# Patient Record
Sex: Male | Born: 1955 | Race: Black or African American | Hispanic: No | Marital: Married | Smoking: Former smoker
Health system: Southern US, Community
[De-identification: ages and names within clinical notes are randomized; demographics above are authoritative.]

## PROBLEM LIST (undated history)

## (undated) DIAGNOSIS — I10 Essential (primary) hypertension: Secondary | ICD-10-CM

## (undated) DIAGNOSIS — E785 Hyperlipidemia, unspecified: Secondary | ICD-10-CM

## (undated) DIAGNOSIS — G5600 Carpal tunnel syndrome, unspecified upper limb: Secondary | ICD-10-CM

## (undated) DIAGNOSIS — H9190 Unspecified hearing loss, unspecified ear: Secondary | ICD-10-CM

## (undated) DIAGNOSIS — E119 Type 2 diabetes mellitus without complications: Secondary | ICD-10-CM

## (undated) DIAGNOSIS — F17200 Nicotine dependence, unspecified, uncomplicated: Secondary | ICD-10-CM

## (undated) DIAGNOSIS — I739 Peripheral vascular disease, unspecified: Secondary | ICD-10-CM

## (undated) HISTORY — DX: Peripheral vascular disease, unspecified: I73.9

## (undated) HISTORY — DX: Type 2 diabetes mellitus without complications: E11.9

## (undated) HISTORY — DX: Hyperlipidemia, unspecified: E78.5

## (undated) HISTORY — DX: Nicotine dependence, unspecified, uncomplicated: F17.200

## (undated) HISTORY — PX: CARPAL TUNNEL RELEASE: SHX101

## (undated) HISTORY — PX: BACK SURGERY: SHX140

---

## 2006-10-03 ENCOUNTER — Ambulatory Visit: Payer: Self-pay | Admitting: Internal Medicine

## 2006-10-03 ENCOUNTER — Observation Stay (HOSPITAL_COMMUNITY): Admission: EM | Admit: 2006-10-03 | Discharge: 2006-10-04 | Payer: Self-pay | Admitting: Emergency Medicine

## 2006-10-04 ENCOUNTER — Encounter: Payer: Self-pay | Admitting: Internal Medicine

## 2006-10-08 ENCOUNTER — Ambulatory Visit: Payer: Self-pay

## 2006-10-20 ENCOUNTER — Ambulatory Visit: Payer: Self-pay | Admitting: Cardiology

## 2010-05-04 HISTORY — PX: COLONOSCOPY: SHX174

## 2010-07-04 ENCOUNTER — Ambulatory Visit: Payer: Self-pay | Admitting: Family Medicine

## 2010-07-04 ENCOUNTER — Ambulatory Visit (INDEPENDENT_AMBULATORY_CARE_PROVIDER_SITE_OTHER): Payer: 59 | Admitting: Family Medicine

## 2010-07-04 DIAGNOSIS — Z0000 Encounter for general adult medical examination without abnormal findings: Secondary | ICD-10-CM

## 2010-07-04 DIAGNOSIS — F17200 Nicotine dependence, unspecified, uncomplicated: Secondary | ICD-10-CM

## 2010-07-04 DIAGNOSIS — E785 Hyperlipidemia, unspecified: Secondary | ICD-10-CM

## 2010-07-04 DIAGNOSIS — F528 Other sexual dysfunction not due to a substance or known physiological condition: Secondary | ICD-10-CM

## 2010-07-08 LAB — HM COLONOSCOPY: HM Colonoscopy: NORMAL

## 2010-07-23 ENCOUNTER — Encounter: Payer: Self-pay | Admitting: Family Medicine

## 2010-09-09 ENCOUNTER — Ambulatory Visit (INDEPENDENT_AMBULATORY_CARE_PROVIDER_SITE_OTHER): Payer: 59 | Admitting: Family Medicine

## 2010-09-09 ENCOUNTER — Encounter: Payer: Self-pay | Admitting: Family Medicine

## 2010-09-09 VITALS — BP 118/76 | HR 80 | Wt 200.0 lb

## 2010-09-09 DIAGNOSIS — F17200 Nicotine dependence, unspecified, uncomplicated: Secondary | ICD-10-CM

## 2010-09-09 DIAGNOSIS — E785 Hyperlipidemia, unspecified: Secondary | ICD-10-CM

## 2010-09-09 NOTE — Progress Notes (Signed)
  Subjective:    Patient ID: Oscar Myers, male    DOB: 1955-08-20, 55 y.o.   MRN: [id number]  HPI he is here for a recheck appointment. He is having no difficulty with his Crestor. He did quit smoking but started again after he became stressed. He does have another quit date set for tomorrow.    Review of Systems     Objective:   Physical Exam alert and in no distress otherwise not examined        Assessment & Plan:  Dyslipidemia. Cigarette abuse. I again discussed smoking cessation with him in regard to abbot versus addiction. We also discussed stress and stress management. Call me if he has further difficulties with this.

## 2010-09-10 ENCOUNTER — Encounter: Payer: Self-pay | Admitting: Family Medicine

## 2010-09-10 LAB — LIPID PANEL
Cholesterol: 177 mg/dL (ref 0–200)
HDL: 31 mg/dL — ABNORMAL LOW (ref >39)
LDL Cholesterol: 116 mg/dL — ABNORMAL HIGH (ref 0–99)
Total CHOL/HDL Ratio: 5.7 Ratio
Triglycerides: 148 mg/dL (ref <150)
VLDL: 30 mg/dL (ref 0–40)

## 2010-09-16 NOTE — Consult Note (Signed)
NAME:  Oscar Myers, Oscar Myers NO.:  [id number]   MEDICAL RECORD NO.:  [id number]          PATIENT TYPE:  OBV   LOCATION:  2018                         FACILITY:  [facility]   PHYSICIAN:  Pricilla Riffle, MD, FACCDATE OF BIRTH:  July 19, 1955   DATE OF CONSULTATION:  10/04/2006  DATE OF DISCHARGE:                                 CONSULTATION   IDENTIFICATION:  Oscar Myers is a 55 year old gentleman who we are asked  to see regarding chest pain.   HISTORY OF PRESENT ILLNESS:  The patient has no known history of  coronary artery disease.  He was admitted on June 1 with a 3-day history  of constant chest pain left parasternal, waxes and wanes, but did not go  away.  No exacerbating or relieving factors until he came to the  hospital.  He received nitroglycerin with some easing though still  present.   The patient had been under increased stress.  No shortness of breath,  active.   ALLERGIES:  VYTORIN LEADING TO ACHES.  LIPITOR TAKEN OFF, QUESTION WHY.   MEDICATIONS HERE:  1. Aspirin 325.  2. Lopressor 25 b.i.d.  3. Morphine p.r.n.  4. Nitroglycerin one inch to chest wall.  5. Protonix 40 daily.   PAST MEDICAL HISTORY:  1. Dyslipidemia.  Lipid panel here was an LDL of 190, HDL 30,      triglyceride 161, cholesterol 262.  2. Borderline diabetes.   SOCIAL HISTORY:  The patient lives in [place] with wife.  He is a  Research scientist (life sciences).  He smokes a pack per day for the past 30 years.  Drinks  occasionally.   FAMILY HISTORY:  Mother died, her heart gave out.  She was 65 and a  diabetic.  Father in good health.  One brother with diabetes.  One  sister uncertain.  One sister with hyperlipidemia and hypertension  (twin).   REVIEW OF SYSTEMS:  All systems reviewed, negative to the above problem  except as stated above.   PHYSICAL EXAMINATION:  GENERAL:  Patient is currently in no distress,  though she notes a 1-2/10 of chest pressure.  VITAL SIGNS:  Blood pressure 100/67, pulse is  56 and regular,  temperature is 97.3, O2 sat on room air is 96%.  HEENT:  Normocephalic, atraumatic.  EOMI.  PERRL.  Throat clear.  Nares  clear.  NECK:  JVP is normal.  No bruits.  No masses.  LUNGS:  Clear without rales or wheezes.  CARDIAC:  Regular rate and rhythm S1-S2.  No S3-S4 or murmurs.  PMI not  displaced.  CHEST:  Nontender to palpation.  ABDOMEN:  Supple, nontender.  No hepatomegaly.  EXTREMITIES:  Good distal pulses throughout.  No lower extremity edema.  NEUROLOGICAL:  Alert and oriented x3.  Cranial nerves II-XII intact.  Motor:  Moving all extremities.   LABORATORY DATA:  Significant for hemoglobin of 13.7, WBC of 5.0, BUN  and creatinine of 13 and 0.9.  Troponin negative x2.  CK-MB negative x2.  EKG shows sinus bradycardia 56 beats per minute.  Chest x-ray no acute  findings.   IMPRESSION:  The patient is a 55 year old with atypical chest pain still  having it approximately 4 days though less intense.  Nonpleuritic, not  associated with activity.  EKG negative.  Troponin and CK negative.  Exam is unremarkable.  Question secondary to subclinical reflux with  irritation of esophagus.  Note the patient does note bright red blood  per rectum about 3 weeks ago times one.  Note also has cardiac risks.  Dyslipidemia.  Borderline diabetes.  Tobacco use.   RECOMMENDATIONS:  If echo is normal, would discharge to home.  We will  arrange for outpatient Myoview, increase Protonix to b.i.d.  Stop  aspirin and NitroPaste.  Stop Zetia and begin Crestor.  Will have the  patient followup.      Pricilla Riffle, MD, [facility]  Electronically Signed     PVR/MEDQ  D:  10/04/2006  T:  10/04/2006  Job:  [id number]

## 2010-09-16 NOTE — Discharge Summary (Signed)
[name], Myers              ACCOUNT NO.:  [id number]   MEDICAL RECORD NO.:  [id number]          PATIENT TYPE:  OBV   LOCATION:  2018                         FACILITY:  [facility]   PHYSICIAN:  Oscar Myers, M.D. DATE OF BIRTH:  1955/09/04   DATE OF ADMISSION:  10/03/2006  DATE OF DISCHARGE:  10/04/2006                               DISCHARGE SUMMARY   DISCHARGE DIAGNOSES:  1. Chest pain of unknown origin.  2. Hyperlipidemia.  3. Tobacco abuse.  4. Question of melenic stools.  5. Degenerative joint disease status post surgeries of his lower back      x4.   DISCHARGE MEDICATIONS:  1. Crestor 5 mg p.o. daily.  2. Omeprazole 40 mg p.o. b.i.d.   DISCHARGE DISPOSITION:  The patient will be discharged to home and  followed by Oscar Myers at [facility] on June 6 at 9:45 a.m.  for a stress Myoview.  He was advised strongly to stop smoking  He  should also follow up with his primary care physician at [facility] who  should follow up on remote history of melenic stool a few weeks ago,  this is resolved now and his hemoglobin was stable during  hospitalization, but this should be followed as an outpatient.   PROCEDURES:  On October 04, 2006 2-D echocardiogram which showed a normal  ejection fraction and no wall motion abnormality.   ADMITTING HISTORY AND PHYSICAL:  Oscar Myers is a 55 year old African  American man with past medical history significant for the above, who  came in complaining of 3 days of chest pressure 5/10 without radiation  that was retrosternal, induced by exertion and it was nonpositional.  It  was not associated with nausea or vomiting, was not associated with  food.  He did have diaphoresis and palpitations but he denies shortness  of breath, cough, fever or chills.  The pain was relieved shortly after  the administration of nitroglycerin in the emergency room.  Of note, his  mother died of heart disease.   PHYSICAL EXAM:  VITAL SIGNS:  Temperature  98.9,  blood pressure 130/65,  heart rate 74, respiratory rate 20, saturating at 100% on 2 liters.  GENERAL:  He was in no acute distress.  HEENT:  His pupils were equal, reactive to light and accommodation.  His  extraocular movements were intact.  He had normal oropharynx.  NECK:  Supple.  LUNGS:  Clear to auscultation bilaterally.  HEART:  Regular rate and rhythm.  No murmurs, rubs or gallops.  ABDOMEN:  Soft with positive bowel sounds but it was tender to palpation  in epigastric area.  EXTREMITIES:  He had no lower extremity edema.  SKIN:  No skin rashes.  NEUROLOGICAL:  Grossly intact.   LABS:  Sodium 138, potassium 3.8, chloride 106, CO2 24, BUN 10,  creatinine 1.1, glucose 106.  Hemoglobin 14 and WBC 6, platelets 156.  Liver panel was normal. __________  care markers were normal times two  in the emergency room.  PT 13, INR 1, the D-dimer was negative.  An EKG  showed sinus bradycardia but was otherwise  normal.   HOSPITAL COURSE:  Given his risk factors the patient was admitted for  rule out of myocardial infarction.  Cardiac enzymes stayed negative  throughout and he had a 2-D echocardiogram on the day of discharge that  was found to be normal.  Dr. Tenny Craw of  [facility] was consulted  to set him up for an outpatient stress test which she will have done on  June 6 at 9:45 in the morning. Until then he will be started on Crestor  5 mg daily as his fasting lipid panel showed a total cholesterol 262,  HDL 30 and LDL of 190.   He did mention having have an episode of melenic versus stool with the  blood and clot in them a few weeks ago.  It has not recurred since, but  given his tender epigastrium we decided to put him on omeprazole 40 mg  p.o. b.i.d. and to keep him off of aspirin.  This should be followed by  his primary care physician to ensure resolution of symptoms.   Discharge labs and vitals include temperature 97.1, blood pressure  121/73, heart rate 58,  respiratory rate 18.  Hemoglobin 13.7, WBC 5,  platelets 151.      Oscar Myers, M.D.  Electronically Signed     VD/MEDQ  D:  10/04/2006  T:  10/05/2006  Job:  [id number]   cc:   [facility]  Pricilla Riffle, MD, [facility]

## 2010-09-16 NOTE — Assessment & Plan Note (Signed)
[facility]                            CARDIOLOGY OFFICE NOTE   KARNELL, VANDERLOOP                     MRN:          [id number]  DATE:10/20/2006                            DOB:          1956/01/20    PRIMARY CARE PHYSICIAN:  Pricilla Riffle, MD, F.A.C.C.   This is a 55 year old African-American male patient, who was admitted to  the hospital June 1 with chest pain.  He ruled out for an MI and 2D  echo, done in the hospital, showed normal LV function, ejection fraction  50%, no wall motion abnormalities, aortic valve thickness was mildly  increased.  He was set up for an outpatient stress test.  This was  performed on October 08, 2006.  Patient exercised 8 minutes, stopped due to  fatigue.  No chest pain, no ST changes.  He had no scar or ischemia and  low normal LV function by this technique.  The EF was estimated at 49%.   Since the patient has been home, he has had occasional tightness in his  chest, but he says it is always while at work.  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 The other thing he says is that, at night, when he  lies down for two to three minutes, he has palpitations, where he feels  his heart racing.  It abates pretty quickly and he can fall asleep.  He  does admit to drinking a two-liter bottle of [facility] every day and  he smokes a pack of cigarettes every day.  He says he is trying to cut  back on both of these, but is having a difficult time and knows he needs  to do it.   CURRENT MEDICATIONS:  1. Crestor 5 mg daily.  2. Omeprazole 40 mg b.i.d. p.r.n.   PHYSICAL EXAM:  This is a pleasant, 55 year old African-American male,  in no acute distress.  Blood pressure 118/82, pulse 70, weight 195.  NECK:  Without JVD, HJR or bruit or thyroid enlargement.  LUNGS:  Decreased breath sounds, but clear, anterior, posterior and  lateral.  HEART:  Regular rate  and rhythm at 70 beats per minute, normal S1 and S2  with a positive S4 and a 1/6 systolic ejection murmur at the left  sternal border.  ABDOMEN:  Soft without organomegaly, masses, lesions or abnormal  tenderness.  EXTREMITIES:  Without cyanosis, clubbing or edema.  He has good distal  pulses.   IMPRESSION:  1. Chest pain, related to stress.  2. Stress Cardiolite negative for ischemia.  Low-normal ejection      fraction of 49%, but 2D echo shows normal LV function, no wall      motion abnormality, ejection fraction 50%.  3. Dyslipidemia.  4. Borderline diabetes.  5. Smoker.  6. Excessive caffeine use.   PLAN AT THIS TIME:  I have asked the patient to cut way back on his  caffeine intake, as well as his cigarette smoking, with hopes of  quitting both.  He  will see Dr. Tenny Craw in two months to follow up.      Jacolyn Reedy, PA-C  Electronically Signed      Everardo Beals. Juanda Chance, MD, [facility]  Electronically Signed   ML/MedQ  DD: 10/20/2006  DT: 10/20/2006  Job #: [id number]   cc:   Pricilla Riffle, MD, [facility]

## 2010-09-25 ENCOUNTER — Encounter: Payer: Self-pay | Admitting: Family Medicine

## 2011-02-19 LAB — BASIC METABOLIC PANEL
BUN: 13
CO2: 28
Calcium: 10
Chloride: 103
Creatinine, Ser: 0.93
GFR calc Af Amer: >60
GFR calc non Af Amer: >60
Glucose, Bld: 105 — ABNORMAL HIGH
Potassium: 3.8
Sodium: 139

## 2011-02-19 LAB — LIPID PANEL
Cholesterol: 262 — ABNORMAL HIGH
HDL: 30 — ABNORMAL LOW
LDL Cholesterol: 190 — ABNORMAL HIGH
Total CHOL/HDL Ratio: 8.7
Triglycerides: 211 — ABNORMAL HIGH
VLDL: 42 — ABNORMAL HIGH

## 2011-02-19 LAB — POCT CARDIAC MARKERS
CKMB, poc: 2
CKMB, poc: 2.6
Myoglobin, poc: 102
Myoglobin, poc: 115
Operator id: 196461
Operator id: 196461
Troponin i, poc: <0.05
Troponin i, poc: <0.05

## 2011-02-19 LAB — DIFFERENTIAL
Basophils Absolute: 0
Basophils Relative: 1
Eosinophils Absolute: 0.1
Eosinophils Relative: 2
Lymphocytes Relative: 45
Lymphs Abs: 2.7
Monocytes Absolute: 0.3
Monocytes Relative: 6
Neutro Abs: 2.8
Neutrophils Relative %: 48

## 2011-02-19 LAB — RAPID URINE DRUG SCREEN, HOSP PERFORMED
Amphetamines: NOT DETECTED
Barbiturates: NOT DETECTED
Benzodiazepines: NOT DETECTED
Cocaine: NOT DETECTED
Opiates: POSITIVE — AB
Tetrahydrocannabinol: NOT DETECTED

## 2011-02-19 LAB — CARDIAC PANEL(CRET KIN+CKTOT+MB+TROPI)
CK, MB: 2.5
CK, MB: 3
Relative Index: 1.1
Relative Index: 1.1
Total CK: 223
Total CK: 279 — ABNORMAL HIGH
Troponin I: 0.01
Troponin I: 0.02

## 2011-02-19 LAB — I-STAT 8, (EC8 V) (CONVERTED LAB)
BUN: 10
Bicarbonate: 24.4 — ABNORMAL HIGH
Chloride: 106
Glucose, Bld: 93
HCT: 48
Hemoglobin: 16.3
Operator id: 196461
Potassium: 3.8
Sodium: 138
TCO2: 26
pCO2, Ven: 37.3 — ABNORMAL LOW
pH, Ven: 7.424 — ABNORMAL HIGH

## 2011-02-19 LAB — URINALYSIS, ROUTINE W REFLEX MICROSCOPIC
Glucose, UA: NEGATIVE
Hgb urine dipstick: NEGATIVE
Ketones, ur: NEGATIVE
Nitrite: NEGATIVE
Protein, ur: NEGATIVE
Specific Gravity, Urine: 1.027
Urobilinogen, UA: 0.2
pH: 6

## 2011-02-19 LAB — COMPREHENSIVE METABOLIC PANEL
ALT: 19
AST: 19
Albumin: 3.7
Alkaline Phosphatase: 74
BUN: 10
CO2: 30
Calcium: 10.5
Chloride: 105
Creatinine, Ser: 0.98
GFR calc Af Amer: >60
GFR calc non Af Amer: >60
Glucose, Bld: 124 — ABNORMAL HIGH
Potassium: 4
Sodium: 140
Total Bilirubin: 1
Total Protein: 6.8

## 2011-02-19 LAB — CBC
HCT: 40.2
HCT: 42.4
Hemoglobin: 13.7
Hemoglobin: 14.1
MCHC: 33.2
MCHC: 34
MCV: 88.9
MCV: 89.2
Platelets: 151
Platelets: 156
RBC: 4.52
RBC: 4.75
RDW: 13.4
RDW: 13.5
WBC: 5
WBC: 6

## 2011-02-19 LAB — POCT I-STAT CREATININE
Creatinine, Ser: 1.1
Operator id: 196461

## 2011-02-19 LAB — CK TOTAL AND CKMB (NOT AT ARMC)
CK, MB: 3.3
Relative Index: 1.1
Total CK: 291 — ABNORMAL HIGH

## 2011-02-19 LAB — TSH: TSH: 1.402

## 2011-02-19 LAB — D-DIMER, QUANTITATIVE (NOT AT ARMC): D-Dimer, Quant: <0.22

## 2011-02-19 LAB — TROPONIN I: Troponin I: 0.01

## 2011-02-19 LAB — PROTIME-INR
INR: 1
Prothrombin Time: 13

## 2011-04-13 ENCOUNTER — Ambulatory Visit (INDEPENDENT_AMBULATORY_CARE_PROVIDER_SITE_OTHER): Payer: 59 | Admitting: Family Medicine

## 2011-04-13 ENCOUNTER — Encounter: Payer: Self-pay | Admitting: Family Medicine

## 2011-04-13 VITALS — BP 116/80 | HR 86 | Wt 186.0 lb

## 2011-04-13 DIAGNOSIS — M79609 Pain in unspecified limb: Secondary | ICD-10-CM

## 2011-04-13 DIAGNOSIS — N529 Male erectile dysfunction, unspecified: Secondary | ICD-10-CM

## 2011-04-13 DIAGNOSIS — M79642 Pain in left hand: Secondary | ICD-10-CM

## 2011-04-13 MED ORDER — SILDENAFIL CITRATE 100 MG PO TABS: 100.0000 mg | ORAL_TABLET | ORAL | Status: DC | PRN

## 2011-04-13 NOTE — Patient Instructions (Signed)
I will get nerve conduction studies and either do an injection or possibly refer you depending upon the results.

## 2011-04-13 NOTE — Progress Notes (Signed)
  Subjective:    Patient ID: Oscar Myers, male    DOB: 1956/02/16, 55 y.o.   MRN: [id number]  HPI Has a two-year history of difficulty with left hand numbness. It is mainly in the thumb, index and large finger. He has tried a wrist splint with no benefit. He has noted no weakness. He does note that driving the car can sometimes cause this. He would also like a refill on his Viagra which she states is working quite well.  Review of Systems     Objective:   Physical Exam Alert and in no distress. Tinel's test is negative and Phalen's test was quickly positive. Normal strength and questionable decreased sensory in the median nerve distribution.       Assessment & Plan:   1. Left hand pain  Motor nerve conduction test w/ f-wave study  2. ED (erectile dysfunction)     his Viagra was renewed. Reevaluate for surgical intervention or possibly injection pending her conduction studies.

## 2011-07-20 ENCOUNTER — Other Ambulatory Visit: Payer: Self-pay | Admitting: Family Medicine

## 2011-12-03 ENCOUNTER — Other Ambulatory Visit: Payer: Self-pay

## 2011-12-03 MED ORDER — ROSUVASTATIN CALCIUM 20 MG PO TABS: ORAL_TABLET | ORAL | Status: DC

## 2011-12-03 NOTE — Telephone Encounter (Signed)
Oscar Myers gave pt 14 day sample per [employer]

## 2012-01-06 ENCOUNTER — Other Ambulatory Visit: Payer: Self-pay | Admitting: Family Medicine

## 2012-04-29 ENCOUNTER — Emergency Department (HOSPITAL_COMMUNITY): Payer: Managed Care, Other (non HMO)

## 2012-04-29 ENCOUNTER — Encounter (HOSPITAL_COMMUNITY): Payer: Self-pay | Admitting: Emergency Medicine

## 2012-04-29 ENCOUNTER — Emergency Department (HOSPITAL_COMMUNITY)
Admission: EM | Admit: 2012-04-29 | Discharge: 2012-04-30 | Disposition: A | Discharge status: Home / Self Care | Payer: Managed Care, Other (non HMO) | Attending: Emergency Medicine | Admitting: Emergency Medicine

## 2012-04-29 DIAGNOSIS — Z7982 Long term (current) use of aspirin: Secondary | ICD-10-CM | POA: Insufficient documentation

## 2012-04-29 DIAGNOSIS — R071 Chest pain on breathing: Secondary | ICD-10-CM | POA: Insufficient documentation

## 2012-04-29 DIAGNOSIS — E785 Hyperlipidemia, unspecified: Secondary | ICD-10-CM | POA: Insufficient documentation

## 2012-04-29 DIAGNOSIS — R079 Chest pain, unspecified: Secondary | ICD-10-CM

## 2012-04-29 DIAGNOSIS — F17200 Nicotine dependence, unspecified, uncomplicated: Secondary | ICD-10-CM | POA: Insufficient documentation

## 2012-04-29 LAB — CBC
HCT: 47.4 % (ref 39.0–52.0)
Hemoglobin: 15.8 g/dL (ref 13.0–17.0)
MCH: 31.3 pg (ref 26.0–34.0)
MCHC: 33.3 g/dL (ref 30.0–36.0)
MCV: 94 fL (ref 78.0–100.0)
Platelets: 129 10*3/uL — ABNORMAL LOW (ref 150–400)
RBC: 5.04 MIL/uL (ref 4.22–5.81)
RDW: 12.7 % (ref 11.5–15.5)
WBC: 6.5 10*3/uL (ref 4.0–10.5)

## 2012-04-29 LAB — POCT I-STAT TROPONIN I
Troponin i, poc: 0 ng/mL (ref 0.00–0.08)
Troponin i, poc: 0 ng/mL (ref 0.00–0.08)

## 2012-04-29 LAB — BASIC METABOLIC PANEL
BUN: 17 mg/dL (ref 6–23)
CO2: 27 mEq/L (ref 19–32)
Calcium: 11.4 mg/dL — ABNORMAL HIGH (ref 8.4–10.5)
Chloride: 102 mEq/L (ref 96–112)
Creatinine, Ser: 0.95 mg/dL (ref 0.50–1.35)
GFR calc Af Amer: >90 mL/min (ref >90)
GFR calc non Af Amer: >90 mL/min (ref >90)
Glucose, Bld: 102 mg/dL — ABNORMAL HIGH (ref 70–99)
Potassium: 4.3 mEq/L (ref 3.5–5.1)
Sodium: 137 mEq/L (ref 135–145)

## 2012-04-29 MED ORDER — GI COCKTAIL ~~LOC~~
30.0000 mL | Freq: Once | ORAL | Status: AC
  Administered 2012-04-29: 30 mL via ORAL
  Filled 2012-04-29: qty 30

## 2012-04-29 NOTE — ED Notes (Signed)
Pt states that he began having chest pain on Christmas day over the left side of his chest.  Describes it as a pressure that every once in a while turns sharp.  States that he has had this same pain a few times before but does not know what it is from.  EKG has been done.

## 2012-04-29 NOTE — ED Provider Notes (Signed)
History     CSN: [id number]  Arrival date & time 04/29/12  1191   First MD Initiated Contact with Patient 04/29/12 2216      Chief Complaint  Patient presents with  . Chest Pain    (Consider location/radiation/quality/duration/timing/severity/associated sxs/prior treatment) HPI Comments: Patient presents with a chief complaint of chest pain.  Pain located in the left anterior chest.  He reports that his pain has been constant for the past two days.   Pain does not radiate.  He reports that nothing makes the pain better.  Pain worse when he stretches out his arms or moves his chest.  He denies SOB, diaphoresis, nausea, vomiting, numbness, tingling, dizziness, lightheadedness, or syncope.  He has no prior cardiac history.  No history of HTN or DM.  He does have a history of Hyperlipidemia.  No FH of cardiac disease.  He denies any prolonged travel or surgery in the past 4 weeks.  Denies prior history of DVT or PE.  No prior history of Cancer.  No LE swelling or pain.  No history of Cancer.  No hemoptysis.  He reports that he had a Cardiac Stress test 4 years ago, which was normal.  The history is provided by the patient.    Past Medical History  Diagnosis Date  . Dyslipidemia     Past Surgical History  Procedure Date  . Colonoscopy 2012  . Back surgery     History reviewed. No pertinent family history.  History  Substance Use Topics  . Smoking status: Current Every Day Smoker -- 1.0 packs/day for 25 years    Types: Cigarettes  . Smokeless tobacco: Not on file     Comment: as of 5 9 2012  . Alcohol Use: No      Review of Systems  Constitutional: Negative for fever, chills and diaphoresis.  HENT: Negative for neck pain and neck stiffness.   Respiratory: Negative for cough, shortness of breath and wheezing.   Cardiovascular: Positive for chest pain. Negative for palpitations and leg swelling.  Gastrointestinal: Negative for nausea and vomiting.  Neurological: Negative  for dizziness, syncope and light-headedness.  All other systems reviewed and are negative.    Allergies  Review of patient's allergies indicates no known allergies.  Home Medications   Current Outpatient Rx  Name  Route  Sig  Dispense  Refill  . ASPIRIN 325 MG PO TABS   Oral   Take 325 mg by mouth daily.         Marland Kitchen ROSUVASTATIN CALCIUM 20 MG PO TABS   Oral   Take 20 mg by mouth daily.         Marland Kitchen SILDENAFIL CITRATE 100 MG PO TABS   Oral   Take 1 tablet (100 mg total) by mouth as needed for erectile dysfunction.   6 tablet   11     BP 128/75  Pulse 78  Temp 98.4 F (36.9 C) (Oral)  Resp 20  SpO2 100%  Physical Exam  Nursing note and vitals reviewed. Constitutional: He appears well-developed and well-nourished. No distress.  HENT:  Head: Normocephalic and atraumatic.  Mouth/Throat: Oropharynx is clear and moist.  Neck: Normal range of motion. Neck supple.  Cardiovascular: Normal rate, regular rhythm, normal heart sounds and intact distal pulses.   Pulmonary/Chest: Effort normal and breath sounds normal. No respiratory distress. He has no wheezes. He has no rales. He exhibits no tenderness.  Abdominal: Soft. There is no tenderness.  Musculoskeletal: Normal range of motion.  No lower extremity edema or erythema Negative Homan's sign bilaterally  Neurological: He is alert.  Skin: Skin is warm and dry. He is not diaphoretic.  Psychiatric: He has a normal mood and affect.    ED Course  Procedures (including critical care time)  Labs Reviewed  CBC - Abnormal; Notable for the following:    Platelets 129 (*)     All other components within normal limits  BASIC METABOLIC PANEL - Abnormal; Notable for the following:    Glucose, Bld 102 (*)     Calcium 11.4 (*)     All other components within normal limits  POCT I-STAT TROPONIN I   Dg Chest 2 View  04/29/2012  *RADIOLOGY REPORT*  Clinical Data: Chest pain  CHEST - 2 VIEW  Comparison: 10/03/2006  Findings:  The heart size and mediastinal contours are within normal limits.  Both lungs are clear. An orthopedic screw is identified within the left glenoid.  IMPRESSION:  No active cardiopulmonary abnormalities.   Original Report Authenticated By: Signa Kell, M.D.      No diagnosis found.   Date: 04/30/2012  Rate: 101  Rhythm: sinus tachycardia  QRS Axis: normal  Intervals: normal  ST/T Wave abnormalities: normal  Conduction Disutrbances:none  Narrative Interpretation:   Old EKG Reviewed: none available  Patient discussed with Dr. Dierdre Highman.    MDM  Patient is to be discharged with recommendation to follow up with PCP in regards to today's hospital visit. Chest pain is not likely of cardiac or pulmonary etiology d/t presentation, no risk factors for PE and pain is not pleuritic, VSS, no tracheal deviation, no JVD or new murmur, RRR, breath sounds equal bilaterally, EKG without acute abnormalities, negative initial and 3 hour troponin, and negative CXR. Chest pain seems most likely to be musculoskeletal.  Patient instructed to follow up with PCP for outpatient stress test.  Pt appears reliable for follow up and is agreeable to discharge. Return precautions discussed.  Case has been discussed with and seen by Dr. Dierdre Highman who agrees with the above plan to discharge.         Pascal Lux [place], PA-C 05/01/12 1319

## 2012-05-01 NOTE — ED Provider Notes (Signed)
Medical screening examination/treatment/procedure(s) were performed by non-physician practitioner and as supervising physician I was immediately available for consultation/collaboration.  Sunnie Nielsen, MD 05/01/12 2303

## 2012-06-21 ENCOUNTER — Telehealth: Payer: Self-pay | Admitting: Family Medicine

## 2012-06-21 NOTE — Telephone Encounter (Signed)
LM

## 2012-06-23 ENCOUNTER — Encounter: Payer: Self-pay | Admitting: Family Medicine

## 2012-06-23 ENCOUNTER — Ambulatory Visit (INDEPENDENT_AMBULATORY_CARE_PROVIDER_SITE_OTHER): Payer: PRIVATE HEALTH INSURANCE | Admitting: Family Medicine

## 2012-06-23 VITALS — BP 114/70 | HR 100 | Wt 185.0 lb

## 2012-06-23 DIAGNOSIS — F17200 Nicotine dependence, unspecified, uncomplicated: Secondary | ICD-10-CM | POA: Insufficient documentation

## 2012-06-23 DIAGNOSIS — Z79899 Other long term (current) drug therapy: Secondary | ICD-10-CM

## 2012-06-23 DIAGNOSIS — Z8249 Family history of ischemic heart disease and other diseases of the circulatory system: Secondary | ICD-10-CM

## 2012-06-23 DIAGNOSIS — E785 Hyperlipidemia, unspecified: Secondary | ICD-10-CM

## 2012-06-23 LAB — LIPID PANEL
Cholesterol: 282 mg/dL — ABNORMAL HIGH (ref 0–200)
HDL: 41 mg/dL (ref >39)
LDL Cholesterol: 218 mg/dL — ABNORMAL HIGH (ref 0–99)
Total CHOL/HDL Ratio: 6.9 Ratio
Triglycerides: 113 mg/dL (ref <150)
VLDL: 23 mg/dL (ref 0–40)

## 2012-06-23 LAB — COMPREHENSIVE METABOLIC PANEL
ALT: 16 U/L (ref 0–53)
AST: 19 U/L (ref 0–37)
Albumin: 4.5 g/dL (ref 3.5–5.2)
Alkaline Phosphatase: 69 U/L (ref 39–117)
BUN: 10 mg/dL (ref 6–23)
CO2: 25 mEq/L (ref 19–32)
Calcium: 10.3 mg/dL (ref 8.4–10.5)
Chloride: 103 mEq/L (ref 96–112)
Creat: 0.78 mg/dL (ref 0.50–1.35)
Glucose, Bld: 105 mg/dL — ABNORMAL HIGH (ref 70–99)
Potassium: 4.2 mEq/L (ref 3.5–5.3)
Sodium: 136 mEq/L (ref 135–145)
Total Bilirubin: 1 mg/dL (ref 0.3–1.2)
Total Protein: 7.3 g/dL (ref 6.0–8.3)

## 2012-06-23 LAB — CBC WITH DIFFERENTIAL/PLATELET
Basophils Absolute: 0 10*3/uL (ref 0.0–0.1)
Basophils Relative: 0 % (ref 0–1)
Eosinophils Absolute: 0.1 10*3/uL (ref 0.0–0.7)
Eosinophils Relative: 1 % (ref 0–5)
HCT: 46.3 % (ref 39.0–52.0)
Hemoglobin: 16 g/dL (ref 13.0–17.0)
Lymphocytes Relative: 43 % (ref 12–46)
Lymphs Abs: 2.3 10*3/uL (ref 0.7–4.0)
MCH: 31.3 pg (ref 26.0–34.0)
MCHC: 34.6 g/dL (ref 30.0–36.0)
MCV: 90.4 fL (ref 78.0–100.0)
Monocytes Absolute: 0.3 10*3/uL (ref 0.1–1.0)
Monocytes Relative: 5 % (ref 3–12)
Neutro Abs: 2.8 10*3/uL (ref 1.7–7.7)
Neutrophils Relative %: 51 % (ref 43–77)
Platelets: 154 10*3/uL (ref 150–400)
RBC: 5.12 MIL/uL (ref 4.22–5.81)
RDW: 13.5 % (ref 11.5–15.5)
WBC: 5.4 10*3/uL (ref 4.0–10.5)

## 2012-06-23 NOTE — Progress Notes (Signed)
  Subjective:    Patient ID: Oscar Myers, male    DOB: 31-Jul-1955, 57 y.o.   MRN: [id number]  HPI He is here for an interval evaluation. He has not been seen in well over a year. He also goes to the [place]. He did switch insurance and is having difficulty with coverage for Crestor. He has no particular concerns or complaints. He does continue to smoke. His work is going well. He is separated. He seems to be handling this well and is comfortable with his present situation. His medical record was reviewed. He is up-to-date on his immunizations and has had a colonoscopy.  Review of Systems     Objective:   Physical Exam alert and in no distress. Tympanic membranes and canals are normal. Throat is clear. Tonsils are normal. Neck is supple without adenopathy or thyromegaly. Cardiac exam shows a regular sinus rhythm without murmurs or gallops. Lungs are clear to auscultation.        Assessment & Plan:  Hyperlipidemia LDL goal < 100 - Plan: Lipid panel  Family history of heart disease in male family member before age 47 - Plan: Lipid panel  Encounter for long-term (current) use of other medications - Plan: Lipid panel, CBC with Differential, Comprehensive metabolic panel  Current smoker  Voucher was given to get Crestor at a less expensive cost. He presently is not interested in quitting smoking. Routine blood work drawn.

## 2012-06-24 NOTE — Progress Notes (Signed)
Quick Note:  His lipids are not at goal. Apparently he did miss several weeks worth of the Crestor. Have him take it regularly and we will check this in 2 months ______

## 2012-06-24 NOTE — Progress Notes (Signed)
Quick Note:  CALLED PT HE WAS INFORMED WORD FOR WORD AND VERBALIZED UNDERSTANDING ______

## 2012-08-09 ENCOUNTER — Other Ambulatory Visit: Payer: Self-pay | Admitting: Family Medicine

## 2012-08-10 ENCOUNTER — Telehealth: Payer: Self-pay | Admitting: Family Medicine

## 2012-08-10 NOTE — Telephone Encounter (Signed)
PATIENT NEEDS TO SCHEDULE A OFFICE VISIT TO FOLLOW UP HIS CHOLESTEROL PER DR. LALONDE IN ORDER TO RECEIVE ANYMORE REFILLS

## 2012-08-10 NOTE — Telephone Encounter (Signed)
Prior auth. Form received for crestor, sent back to Dr. Susann Givens to be completed

## 2012-08-11 ENCOUNTER — Telehealth: Payer: Self-pay | Admitting: Family Medicine

## 2012-08-15 ENCOUNTER — Telehealth: Payer: Self-pay | Admitting: Family Medicine

## 2012-08-15 MED ORDER — ROSUVASTATIN CALCIUM 20 MG PO TABS: ORAL_TABLET | ORAL | Status: DC

## 2012-08-17 ENCOUNTER — Encounter: Payer: Self-pay | Admitting: Family Medicine

## 2012-08-23 NOTE — Telephone Encounter (Signed)
LM

## 2012-08-24 NOTE — Telephone Encounter (Signed)
LM

## 2012-09-28 ENCOUNTER — Other Ambulatory Visit: Payer: Self-pay

## 2012-09-28 ENCOUNTER — Other Ambulatory Visit: Payer: Self-pay | Admitting: Family Medicine

## 2012-09-28 MED ORDER — ROSUVASTATIN CALCIUM 20 MG PO TABS: ORAL_TABLET | ORAL | Status: DC

## 2012-09-28 NOTE — Telephone Encounter (Signed)
SENT MED BACK IN NOT SAMPLES

## 2012-09-28 NOTE — Telephone Encounter (Signed)
PT WAS ON LIPITOR BUT DIDN'T ADEQUATELY CONTROL HIS CHOLESTEROL & VYTORIN WAS UNABLE TO TOLERATE B/C OF MUSCLE ACHES & PAINS.  IS THERE ANOTHER GENERIC AVAILABLE HE COULD TRY OR RETRY ATORVASTATIN AT HIGHER DOSE?

## 2012-09-28 NOTE — Telephone Encounter (Signed)
The best option for him would be to stay with Crestor

## 2012-09-28 NOTE — Telephone Encounter (Signed)
CALLED PT TO INFORM HIM JCL WANTS HIM TO STAY ON CRESTOR LEFT MESSAGE WORD FOR WORD

## 2012-12-09 ENCOUNTER — Telehealth: Payer: Self-pay | Admitting: Family Medicine

## 2012-12-09 MED ORDER — ROSUVASTATIN CALCIUM 20 MG PO TABS: ORAL_TABLET | ORAL | Status: DC

## 2012-12-09 NOTE — Telephone Encounter (Signed)
LM

## 2013-09-12 ENCOUNTER — Telehealth: Payer: Self-pay | Admitting: Family Medicine

## 2013-09-12 MED ORDER — ROSUVASTATIN CALCIUM 20 MG PO TABS: ORAL_TABLET | ORAL | Status: DC

## 2013-09-12 NOTE — Telephone Encounter (Signed)
Pt called and requested a refill on crestor. He was informed that he needed an appt. Pt states that he is having issues with his ins and it should be fixed in a couple of weeks. Pt did not schedule an appt. But is completely out of Crestor. Please refill crestor to [facility] on [address]. Pt states he will call back and make an appt as soon as ins gets fixed.

## 2013-09-12 NOTE — Telephone Encounter (Signed)
done

## 2013-09-14 ENCOUNTER — Telehealth: Payer: Self-pay | Admitting: Family Medicine

## 2013-09-16 NOTE — Telephone Encounter (Signed)
Initiated P.A. For Crestor

## 2013-09-18 NOTE — Telephone Encounter (Signed)
P.A. Crestor approved til 08/14/15, called pharmacy & Rx went thru but pt's discount card has expired.  Called pt & informed, also printed out discount card & pt is to come back office and pick up

## 2014-03-19 IMAGING — CR DG CHEST 2V
2 series · 2 of 2 positions shown · non-contrast
Comparison: 10/03/2006

CLINICAL DATA: Chest pain

CHEST - 2 VIEW

[w chest pa]
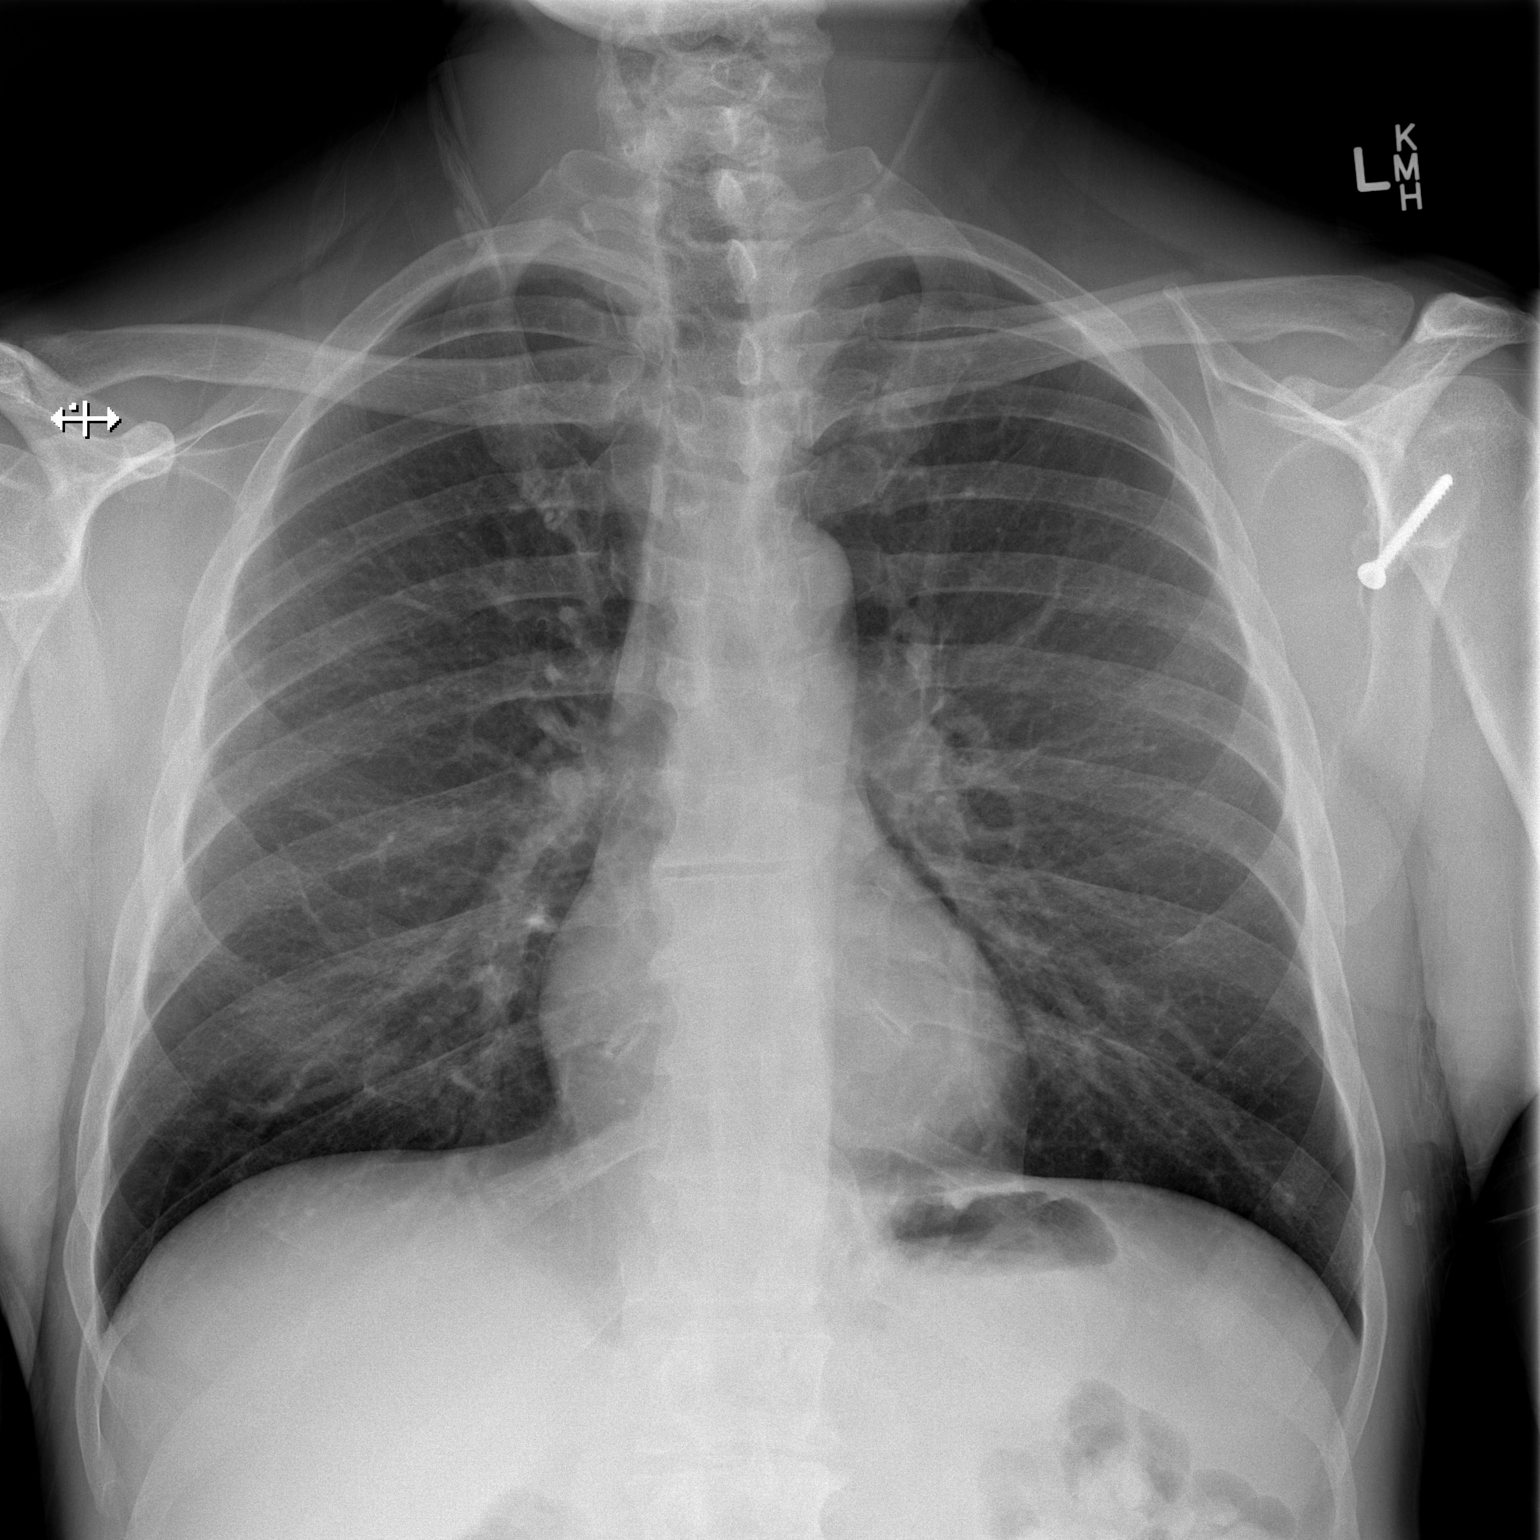

[w chest lat]
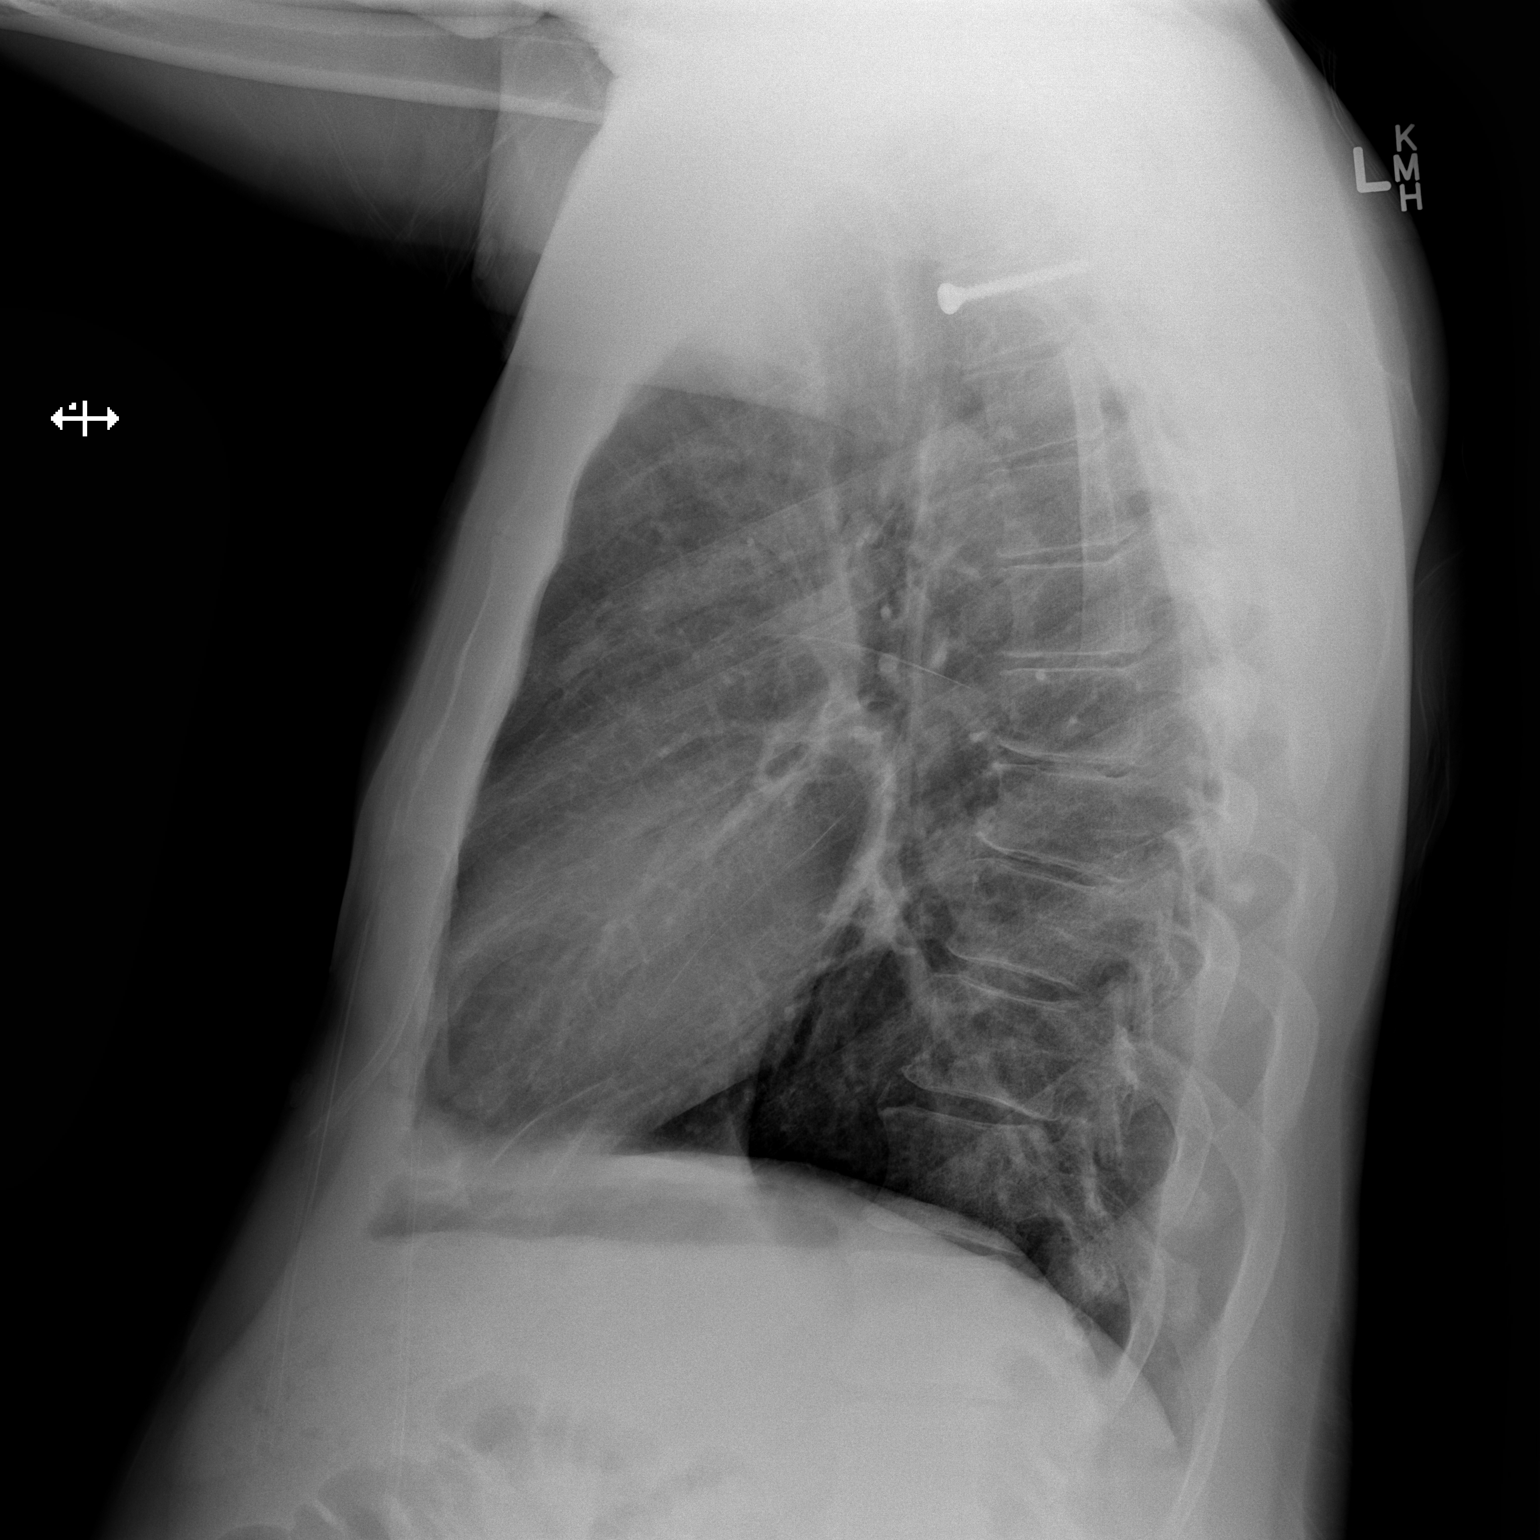

[2 of 2 positions shown; findings below may reference images not displayed]

FINDINGS: The heart size and mediastinal contours are within normal
limits.  Both lungs are clear. An orthopedic screw is identified
within the left glenoid.
IMPRESSION: No active cardiopulmonary abnormalities.

## 2014-07-03 ENCOUNTER — Encounter: Payer: Self-pay | Admitting: Family Medicine

## 2014-07-03 ENCOUNTER — Ambulatory Visit (INDEPENDENT_AMBULATORY_CARE_PROVIDER_SITE_OTHER): Payer: 59 | Admitting: Family Medicine

## 2014-07-03 VITALS — BP 116/74 | HR 99 | Ht 69.5 in | Wt 186.0 lb

## 2014-07-03 DIAGNOSIS — M7581 Other shoulder lesions, right shoulder: Secondary | ICD-10-CM | POA: Diagnosis not present

## 2014-07-03 DIAGNOSIS — F17200 Nicotine dependence, unspecified, uncomplicated: Secondary | ICD-10-CM

## 2014-07-03 DIAGNOSIS — Z720 Tobacco use: Secondary | ICD-10-CM | POA: Diagnosis not present

## 2014-07-03 DIAGNOSIS — Z0000 Encounter for general adult medical examination without abnormal findings: Secondary | ICD-10-CM | POA: Diagnosis not present

## 2014-07-03 DIAGNOSIS — M778 Other enthesopathies, not elsewhere classified: Secondary | ICD-10-CM

## 2014-07-03 DIAGNOSIS — N529 Male erectile dysfunction, unspecified: Secondary | ICD-10-CM

## 2014-07-03 DIAGNOSIS — E785 Hyperlipidemia, unspecified: Secondary | ICD-10-CM

## 2014-07-03 DIAGNOSIS — Z8249 Family history of ischemic heart disease and other diseases of the circulatory system: Secondary | ICD-10-CM | POA: Diagnosis not present

## 2014-07-03 LAB — COMPREHENSIVE METABOLIC PANEL
ALT: 21 U/L (ref 0–53)
AST: 22 U/L (ref 0–37)
Albumin: 4.5 g/dL (ref 3.5–5.2)
Alkaline Phosphatase: 91 U/L (ref 39–117)
BUN: 10 mg/dL (ref 6–23)
CO2: 25 mEq/L (ref 19–32)
Calcium: 10.5 mg/dL (ref 8.4–10.5)
Chloride: 103 mEq/L (ref 96–112)
Creat: 0.81 mg/dL (ref 0.50–1.35)
Glucose, Bld: 102 mg/dL — ABNORMAL HIGH (ref 70–99)
Potassium: 4 mEq/L (ref 3.5–5.3)
Sodium: 135 mEq/L (ref 135–145)
Total Bilirubin: 0.6 mg/dL (ref 0.2–1.2)
Total Protein: 7.4 g/dL (ref 6.0–8.3)

## 2014-07-03 LAB — CBC WITH DIFFERENTIAL/PLATELET
Basophils Absolute: 0 10*3/uL (ref 0.0–0.1)
Basophils Relative: 0 % (ref 0–1)
Eosinophils Absolute: 0 10*3/uL (ref 0.0–0.7)
Eosinophils Relative: 1 % (ref 0–5)
HCT: 43.9 % (ref 39.0–52.0)
Hemoglobin: 14.9 g/dL (ref 13.0–17.0)
Lymphocytes Relative: 43 % (ref 12–46)
Lymphs Abs: 2 10*3/uL (ref 0.7–4.0)
MCH: 30.1 pg (ref 26.0–34.0)
MCHC: 33.9 g/dL (ref 30.0–36.0)
MCV: 88.7 fL (ref 78.0–100.0)
MPV: 10.1 fL (ref 8.6–12.4)
Monocytes Absolute: 0.2 10*3/uL (ref 0.1–1.0)
Monocytes Relative: 5 % (ref 3–12)
Neutro Abs: 2.3 10*3/uL (ref 1.7–7.7)
Neutrophils Relative %: 51 % (ref 43–77)
Platelets: 149 10*3/uL — ABNORMAL LOW (ref 150–400)
RBC: 4.95 MIL/uL (ref 4.22–5.81)
RDW: 13.1 % (ref 11.5–15.5)
WBC: 4.6 10*3/uL (ref 4.0–10.5)

## 2014-07-03 LAB — POCT URINALYSIS DIPSTICK
Bilirubin, UA: NEGATIVE
Blood, UA: NEGATIVE
Glucose, UA: NEGATIVE
Ketones, UA: NEGATIVE
Leukocytes, UA: NEGATIVE
Nitrite, UA: NEGATIVE
Protein, UA: NEGATIVE
Spec Grav, UA: 1.015
Urobilinogen, UA: NEGATIVE
pH, UA: 6

## 2014-07-03 LAB — LIPID PANEL
Cholesterol: 284 mg/dL — ABNORMAL HIGH (ref 0–200)
HDL: 40 mg/dL (ref >=40)
LDL Cholesterol: 203 mg/dL — ABNORMAL HIGH (ref 0–99)
Total CHOL/HDL Ratio: 7.1 Ratio
Triglycerides: 207 mg/dL — ABNORMAL HIGH (ref <150)
VLDL: 41 mg/dL — ABNORMAL HIGH (ref 0–40)

## 2014-07-03 MED ORDER — SILDENAFIL CITRATE 100 MG PO TABS: 100.0000 mg | ORAL_TABLET | ORAL | Status: DC | PRN

## 2014-07-03 MED ORDER — ROSUVASTATIN CALCIUM 20 MG PO TABS: ORAL_TABLET | ORAL | Status: DC

## 2014-07-03 NOTE — Patient Instructions (Signed)
Take 2 Aleve twice a day for the next 2 weeks and see what that will do for the shoulder

## 2014-07-03 NOTE — Progress Notes (Signed)
   Subjective:    Patient ID: Oscar Myers, male    DOB: [birth year], 59 y.o.   MRN: [id number]  HPI He is a pleasant 59 year old male here for an annual exam. He complains of left hand pain and swelling intermittently for past 2 years, denies injury. He also reports a 2 month history of intermittent right shoulder pain with abduction and external rotation as well as occasional discomfort when lying on it. He denies injury to shoulder. 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 He reports occasional brief episodes of sharp chest pains that last about 5 seconds. This has been happening for the past 2 years and does not seem to be worsening. He reports his mother has heart disease and possibly died of a heart attack in her 3260s. Denies fever, chills, DOE, weight loss, GI/GU complaints. He has not been taking his Crestor for about a year due to loss of insurance but now has insurance and would like to start taking it. He also reports taking Viagra as needed, about once every 2-3 months . He is still smoking but has cut back to 5 cigarettes per day and drinks alcohol socially. He lives with his wife. Medications, family history, health maintenance, and immunizations were reviewed.   Review of Systems  All other systems reviewed and are negative.      Objective:   Physical Exam Alert and in no distress. Tympanic membranes and canals are normal. Throat is clear. Tonsils are normal. Neck is supple without adenopathy or thyromegaly. Cardiac exam shows a regular sinus rhythm without murmurs or gallops. Lungs are clear to auscultation. Abdomen soft and non tender, normal bowel sounds and no hepatosplenomegaly. Left hand without erythema, swelling or deformity, non tender, good sensation and strength. Right shoulder shows no asymmetry or obvious deformity, good ROM, no laxity but some pain with external rotation.        Assessment & Plan:  Routine general medical  examination at a health care facility - Plan: CBC with Differential/Platelet, Comprehensive metabolic panel, Lipid panel, POCT Urinalysis Dipstick  Hyperlipidemia with target LDL less than 100 - Plan: rosuvastatin (CRESTOR) 20 MG tablet, Lipid panel  Family history of heart disease in male family member before age 58 - Plan: Lipid panel  Current smoker  Erectile dysfunction, unspecified erectile dysfunction type - Plan: sildenafil (VIAGRA) 100 MG tablet  Right shoulder tendinitis  Recommend taking Aleve twice daily for 2 weeks for shoulder and hand pain and call if no improvement. Discussed smoking cessation, he is not ready to stop. Start Crestor and follow-up in 2 months. Discussed fleeting intermittent pains in his chest and assured him that it does not sound heart related.

## 2014-09-04 ENCOUNTER — Ambulatory Visit: Payer: Self-pay | Admitting: Family Medicine

## 2014-09-11 ENCOUNTER — Telehealth: Payer: Self-pay | Admitting: Family Medicine

## 2014-09-11 ENCOUNTER — Ambulatory Visit (INDEPENDENT_AMBULATORY_CARE_PROVIDER_SITE_OTHER): Payer: 59 | Admitting: Family Medicine

## 2014-09-11 ENCOUNTER — Encounter: Payer: Self-pay | Admitting: Family Medicine

## 2014-09-11 VITALS — BP 120/88 | HR 80 | Wt 196.4 lb

## 2014-09-11 DIAGNOSIS — E785 Hyperlipidemia, unspecified: Secondary | ICD-10-CM

## 2014-09-11 LAB — LIPID PANEL
Cholesterol: 204 mg/dL — ABNORMAL HIGH (ref 0–200)
HDL: 41 mg/dL (ref >=40)
LDL Cholesterol: 128 mg/dL — ABNORMAL HIGH (ref 0–99)
Total CHOL/HDL Ratio: 5 Ratio
Triglycerides: 176 mg/dL — ABNORMAL HIGH (ref <150)
VLDL: 35 mg/dL (ref 0–40)

## 2014-09-11 MED ORDER — ROSUVASTATIN CALCIUM 20 MG PO TABS: ORAL_TABLET | ORAL | Status: DC

## 2014-09-11 NOTE — Addendum Note (Signed)
Addended by: Sharlot GowdaLALONDE, JOHN C on: 09/11/2014 01:00 PM   Modules accepted: Orders

## 2014-09-11 NOTE — Telephone Encounter (Signed)
Pt came came back into the office to office to let us know that his insurance will not cover 90day supply of Crestor. Pharmacy did give him 30 day supply however since pt's job is on break now, he will not have insurance after this week so he will not be able to afford to get meds after this week. Requesting samples. We do not have samples today. Pt will check with us again later.

## 2014-09-11 NOTE — Progress Notes (Signed)
   Subjective:    Patient ID: Oscar KayMichael A Myers, male    DOB: April 08, 1956, 59 y.o.   MRN: [id number]  HPI He is here for blood work only concerning his cholesterol   Review of Systems     Objective:   Physical Exam        Assessment & Plan:  He is here for blood work only

## 2014-09-12 ENCOUNTER — Other Ambulatory Visit: Payer: Self-pay | Admitting: Family Medicine

## 2014-09-12 NOTE — Progress Notes (Signed)
Called pt with lab results and doctor's recommendations.  Sent dietary information in the mail.

## 2015-06-19 ENCOUNTER — Ambulatory Visit (INDEPENDENT_AMBULATORY_CARE_PROVIDER_SITE_OTHER): Payer: 59 | Admitting: Medical

## 2015-06-19 ENCOUNTER — Encounter: Payer: Self-pay | Admitting: Medical

## 2015-06-19 VITALS — BP 120/74 | HR 95 | Wt 195.0 lb

## 2015-06-19 DIAGNOSIS — R202 Paresthesia of skin: Secondary | ICD-10-CM | POA: Diagnosis not present

## 2015-06-19 DIAGNOSIS — M79642 Pain in left hand: Secondary | ICD-10-CM | POA: Diagnosis not present

## 2015-06-19 DIAGNOSIS — M7989 Other specified soft tissue disorders: Secondary | ICD-10-CM | POA: Diagnosis not present

## 2015-06-19 MED ORDER — TRAMADOL HCL 50 MG PO TABS: 50.0000 mg | ORAL_TABLET | Freq: Three times a day (TID) | ORAL | Status: DC | PRN

## 2015-06-19 NOTE — Progress Notes (Signed)
Subjective: Chief Complaint  Patient presents with  . left hand pain    some times in elbow. no injury, but has been going on for years. has been worsening. swole up on friday. feels like pens and needles. got a shot at urgent care sunday.   Having problems with left hand for a long time.  Swells, wakes him up in the middle of the night.   This was weekend really bad swelling and pain.   No symptoms in right hand.  Got a little elbow pain recently, no shoulder or upper arm pain.  No hx/o injury, no trauma.  Is a Engineer, production, works at [employer].   Left thumb swells, last week all knuckles swollen, whole hand felt swollen.   Been using night time wrist splint for several weeks without improvement.  Went to urgent care recently for same, had prednisone shot in the butt, really helped the pain and swelling .  Does get some morning stiffness.   Gets numbness in left thumb, and all fingers except small finger.  But numbness is intermittent can vary from finger to finger.   No fever, no other joint pain or swelling.  Reviewing prior records, he does recall 2012 visit here for same symptoms in left hand but he never went for EMG testing as it was going to cost $2000.  No other aggravating or relieving factors. No other complaint.  Past Medical History  Diagnosis Date  . 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   Normal wrist and finger ROM.    +left tinels and phalen. Otherwise normal strength, sensations of bilat fingers, hands and wrists UE pulses normal, cap refill normal No edema Rest of arms nontender, normal ROM without deformity   Assessment: Encounter Diagnoses  Name Primary?  . Left hand paresthesia Yes  . Left hand pain   . Swelling of  left hand     Plan: Symptoms and exam suggest CTS left wrist.  Discussed other differential though.  Go for xray.  Assuming normal xray, will reconsider EMGs.   Discussed potential for steroid injection for likely carpal tunnel syndrome.  C/t night splints.  C/t OTC NSAID.  Ultram today for prn use for pain.

## 2015-06-26 ENCOUNTER — Ambulatory Visit
Admission: RE | Admit: 2015-06-26 | Discharge: 2015-06-26 | Disposition: A | Discharge status: Home / Self Care | Payer: 59 | Source: Ambulatory Visit | Attending: Medical | Admitting: Medical

## 2015-06-26 DIAGNOSIS — M7989 Other specified soft tissue disorders: Secondary | ICD-10-CM

## 2015-06-26 DIAGNOSIS — M79642 Pain in left hand: Secondary | ICD-10-CM

## 2015-06-26 DIAGNOSIS — R202 Paresthesia of skin: Secondary | ICD-10-CM

## 2015-09-05 ENCOUNTER — Encounter: Payer: Self-pay | Admitting: Family Medicine

## 2015-09-05 ENCOUNTER — Ambulatory Visit (INDEPENDENT_AMBULATORY_CARE_PROVIDER_SITE_OTHER): Payer: 59 | Admitting: Family Medicine

## 2015-09-05 VITALS — BP 100/60 | HR 95 | Ht 70.0 in | Wt 196.4 lb

## 2015-09-05 DIAGNOSIS — G5602 Carpal tunnel syndrome, left upper limb: Secondary | ICD-10-CM

## 2015-09-05 MED ORDER — TRIAMCINOLONE ACETONIDE 40 MG/ML IJ SUSP
40.0000 mg | Freq: Once | INTRAMUSCULAR | Status: AC
  Administered 2015-09-05: 40 mg via INTRAMUSCULAR

## 2015-09-05 MED ORDER — TRAMADOL HCL 50 MG PO TABS: 50.0000 mg | ORAL_TABLET | Freq: Three times a day (TID) | ORAL | Status: DC | PRN

## 2015-09-05 MED ORDER — LIDOCAINE HCL (PF) 1 % IJ SOLN
2.0000 mL | Freq: Once | INTRAMUSCULAR | Status: AC
  Administered 2015-09-05: 2 mL via INTRADERMAL

## 2015-09-05 NOTE — Progress Notes (Signed)
Subjective:     Patient ID: Oscar Myers, male   DOB: Jul 04, 1955, 60 y.o.   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  His hand becomes very painful at night and sometimes wakes him.  He was seen in clinic in February and had a normal hand X-Ray.  He was then seen by orthopedics and was given systemic steroids and tramadol for CTS.   He has tried maximal dosing of NSAIDs to control the pain to little effect.  He also has been using a night splint but cannot use the splint while working  Review of Systems     Objective:   Physical Exam No thenar atrophy. Decreased sensation and numbness in plamar aspect of left hand In the median nerve distribution Tinel and Phalen positive    Assessment/Plan:     Carpal tunnel syndrome of left wrist - Plan: lidocaine (PF) (XYLOCAINE) 1 % injection 2 mL, triamcinolone acetonide (KENALOG-40) injection 40 mg, traMADol (ULTRAM) 50 MG tablet   Recommended nerve conduction studies to prepare for possible surgery but patient declined due to cost.  He elected to have an injection. The left wrist was prepped with Betadine. The palmaris longus tendon was identified. 20 mg of Kenalog and 2 mL of Xylocaine was injected into the carpal tunnel. He did get numbness in the median nerve distribution. A prescription for tramadol to help with the pain.  Recommended using the tramadol as little as possible for symptomatic relief.  If he continues to have difficulty, further evaluation and treatment needed. He will attempt to a different insurance plan.     History and physical exam conducted by Emerson Monteaniel Marchuk (Medical Student) in conjunction with Dr. Susann GivensLalonde.

## 2015-09-16 ENCOUNTER — Other Ambulatory Visit: Payer: Self-pay | Admitting: Family Medicine

## 2016-01-03 ENCOUNTER — Encounter: Payer: Self-pay | Admitting: Family Medicine

## 2016-01-03 ENCOUNTER — Ambulatory Visit (INDEPENDENT_AMBULATORY_CARE_PROVIDER_SITE_OTHER): Payer: Self-pay | Admitting: Family Medicine

## 2016-01-03 VITALS — BP 130/82 | HR 86 | Wt 190.0 lb

## 2016-01-03 DIAGNOSIS — M25532 Pain in left wrist: Secondary | ICD-10-CM

## 2016-01-03 DIAGNOSIS — G5602 Carpal tunnel syndrome, left upper limb: Secondary | ICD-10-CM

## 2016-01-03 MED ORDER — LIDOCAINE HCL (PF) 1 % IJ SOLN
2.0000 mL | Freq: Once | INTRAMUSCULAR | Status: AC
  Administered 2016-01-03: 2 mL via INTRADERMAL

## 2016-01-03 MED ORDER — TRIAMCINOLONE ACETONIDE 40 MG/ML IJ SUSP
40.0000 mg | Freq: Once | INTRAMUSCULAR | Status: AC
  Administered 2016-01-03: 40 mg via INTRAMUSCULAR

## 2016-01-03 NOTE — Progress Notes (Signed)
   Subjective:    Patient ID: Oscar Myers, male    DOB: 1955/06/02, 60 y.o.   MRN: [id number]  HPI He is here for consult concerning difficulty with left wrist pain numbness and tingling. Since last being seen he has switched jobs making it necessary to use his wrist less often. He not have insurance. In May he was given an injection which apparently did help until the last 2 weeks we have noticed more pain and tingling. He would like another injection. He has had a previous history of this over the last several years.   Review of Systems     Objective:   Physical Exam Alert and in no distress. Canal and Phalen's test was positive.       Assessment & Plan:  Left wrist pain - Plan: lidocaine (PF) (XYLOCAINE) 1 % injection 2 mL, triamcinolone acetonide (KENALOG-40) injection 40 mg  Carpal tunnel syndrome of left wrist The wrist was prepped with Betadine. 20 mg of Kenalog and 1 mL of Xylocaine was injected into the carpal tunnel without difficulty. He did note anesthesia and the median nerve distribution. Discussed the need for him to get insurance and if this continues, further diagnostic testing and possible referral to orthopedics would be appropriate.

## 2016-04-23 NOTE — H&P (Signed)
[facility] [address]   [address] [place], [place] [id number] [phone] A Division of [facility]             RE: Baldo AshMARTIN, Oscar   [phone]   [birth year]  04-20-16 Reason for visit: Left carpal tunnel syndrome.  Referral from Dr. Farris HasKramer. History of present illness: He has had years of off and on numbness in his median nerve distribution in his left hand.  It has gotten worse over the last several months and it has essentially been permanent.  He has EMGs which confirm compression at his carpal tunnel.  He had an open carpal tunnel release on the right side and did well with this.   EXAMINATION: Well appearing male in no apparent distress.  Positive Phalen's.  Positive Durkin's test.  He has no muscle atrophy.  Well healed incision on the other side.    X-RAYS: None today.  ASSESSMENT/PLAN: He has carpal tunnel syndrome on the left side.  I discussed endoscopic carpal tunnel release and he would like to go forward with this.   Jewel Baizeimothy D.  Eulah PontMurphy, M.D.  Electronically verified by Jewel Baizeimothy D. Eulah PontMurphy, M.D. TDM:jjh D 04-21-16 T 04-22-16

## 2016-04-28 ENCOUNTER — Encounter (HOSPITAL_BASED_OUTPATIENT_CLINIC_OR_DEPARTMENT_OTHER): Payer: Self-pay | Admitting: *Deleted

## 2016-04-30 ENCOUNTER — Ambulatory Visit (HOSPITAL_BASED_OUTPATIENT_CLINIC_OR_DEPARTMENT_OTHER): Payer: BLUE CROSS/BLUE SHIELD | Admitting: Anesthesiology

## 2016-04-30 ENCOUNTER — Encounter (HOSPITAL_BASED_OUTPATIENT_CLINIC_OR_DEPARTMENT_OTHER)
Admission: RE | Disposition: A | Discharge status: Home / Self Care | Payer: Self-pay | Source: Ambulatory Visit | Attending: Orthopedic Surgery

## 2016-04-30 ENCOUNTER — Ambulatory Visit (HOSPITAL_BASED_OUTPATIENT_CLINIC_OR_DEPARTMENT_OTHER)
Admission: RE | Admit: 2016-04-30 | Discharge: 2016-04-30 | Disposition: A | Discharge status: Home / Self Care | Payer: BLUE CROSS/BLUE SHIELD | Source: Ambulatory Visit | Attending: Orthopedic Surgery | Admitting: Orthopedic Surgery

## 2016-04-30 ENCOUNTER — Encounter (HOSPITAL_BASED_OUTPATIENT_CLINIC_OR_DEPARTMENT_OTHER): Payer: Self-pay | Admitting: Anesthesiology

## 2016-04-30 DIAGNOSIS — G5602 Carpal tunnel syndrome, left upper limb: Secondary | ICD-10-CM | POA: Insufficient documentation

## 2016-04-30 DIAGNOSIS — F17200 Nicotine dependence, unspecified, uncomplicated: Secondary | ICD-10-CM | POA: Diagnosis not present

## 2016-04-30 DIAGNOSIS — Z79899 Other long term (current) drug therapy: Secondary | ICD-10-CM | POA: Diagnosis not present

## 2016-04-30 HISTORY — DX: Carpal tunnel syndrome, unspecified upper limb: G56.00

## 2016-04-30 HISTORY — PX: CARPAL TUNNEL RELEASE: SHX101

## 2016-04-30 SURGERY — RELEASE, CARPAL TUNNEL, ENDOSCOPIC
Anesthesia: General | Site: Hand | Laterality: Left

## 2016-04-30 MED ORDER — PROMETHAZINE HCL 25 MG/ML IJ SOLN: 6.2500 mg | INTRAMUSCULAR | Status: DC | PRN

## 2016-04-30 MED ORDER — KETOROLAC TROMETHAMINE 30 MG/ML IJ SOLN
INTRAMUSCULAR | Status: AC
  Filled 2016-04-30: qty 1

## 2016-04-30 MED ORDER — LACTATED RINGERS IV SOLN: INTRAVENOUS | Status: DC

## 2016-04-30 MED ORDER — HYDROMORPHONE HCL 1 MG/ML IJ SOLN: 0.2500 mg | INTRAMUSCULAR | Status: DC | PRN

## 2016-04-30 MED ORDER — FENTANYL CITRATE (PF) 100 MCG/2ML IJ SOLN
INTRAMUSCULAR | Status: AC
  Filled 2016-04-30: qty 2

## 2016-04-30 MED ORDER — HYDROCODONE-ACETAMINOPHEN 5-325 MG PO TABS: 1.0000 | ORAL_TABLET | ORAL | 0 refills | Status: DC | PRN

## 2016-04-30 MED ORDER — BUPIVACAINE HCL (PF) 0.5 % IJ SOLN
INTRAMUSCULAR | Status: DC | PRN
  Administered 2016-04-30: 5 mL

## 2016-04-30 MED ORDER — ONDANSETRON HCL 4 MG/2ML IJ SOLN
INTRAMUSCULAR | Status: DC | PRN
  Administered 2016-04-30: 4 mg via INTRAVENOUS

## 2016-04-30 MED ORDER — SCOPOLAMINE 1 MG/3DAYS TD PT72: 1.0000 | MEDICATED_PATCH | Freq: Once | TRANSDERMAL | Status: DC | PRN

## 2016-04-30 MED ORDER — MEPERIDINE HCL 25 MG/ML IJ SOLN: 6.2500 mg | INTRAMUSCULAR | Status: DC | PRN

## 2016-04-30 MED ORDER — PROPOFOL 10 MG/ML IV BOLUS
INTRAVENOUS | Status: AC
  Filled 2016-04-30: qty 20

## 2016-04-30 MED ORDER — ACETAMINOPHEN 500 MG PO TABS
ORAL_TABLET | ORAL | Status: AC
  Filled 2016-04-30: qty 2

## 2016-04-30 MED ORDER — DEXAMETHASONE SODIUM PHOSPHATE 4 MG/ML IJ SOLN
INTRAMUSCULAR | Status: DC | PRN
  Administered 2016-04-30: 10 mg via INTRAVENOUS

## 2016-04-30 MED ORDER — LACTATED RINGERS IV SOLN
INTRAVENOUS | Status: DC
  Administered 2016-04-30 (×2): via INTRAVENOUS

## 2016-04-30 MED ORDER — OXYCODONE HCL 5 MG/5ML PO SOLN: 5.0000 mg | Freq: Once | ORAL | Status: DC | PRN

## 2016-04-30 MED ORDER — CEFAZOLIN SODIUM-DEXTROSE 2-4 GM/100ML-% IV SOLN
2.0000 g | INTRAVENOUS | Status: AC
  Administered 2016-04-30: 2 g via INTRAVENOUS

## 2016-04-30 MED ORDER — FENTANYL CITRATE (PF) 100 MCG/2ML IJ SOLN
50.0000 ug | INTRAMUSCULAR | Status: AC | PRN
  Administered 2016-04-30 (×3): 50 ug via INTRAVENOUS

## 2016-04-30 MED ORDER — LIDOCAINE HCL (CARDIAC) 20 MG/ML IV SOLN
INTRAVENOUS | Status: DC | PRN
  Administered 2016-04-30: 60 mg via INTRAVENOUS

## 2016-04-30 MED ORDER — CEFAZOLIN SODIUM-DEXTROSE 2-4 GM/100ML-% IV SOLN
INTRAVENOUS | Status: AC
  Filled 2016-04-30: qty 100

## 2016-04-30 MED ORDER — DEXAMETHASONE SODIUM PHOSPHATE 10 MG/ML IJ SOLN
INTRAMUSCULAR | Status: AC
  Filled 2016-04-30: qty 1

## 2016-04-30 MED ORDER — ONDANSETRON HCL 4 MG/2ML IJ SOLN
INTRAMUSCULAR | Status: AC
  Filled 2016-04-30: qty 2

## 2016-04-30 MED ORDER — ONDANSETRON HCL 4 MG PO TABS: 4.0000 mg | ORAL_TABLET | Freq: Three times a day (TID) | ORAL | 0 refills | Status: DC | PRN

## 2016-04-30 MED ORDER — OXYCODONE HCL 5 MG PO TABS: 5.0000 mg | ORAL_TABLET | Freq: Once | ORAL | Status: DC | PRN

## 2016-04-30 MED ORDER — CHLORHEXIDINE GLUCONATE 4 % EX LIQD: 60.0000 mL | Freq: Once | CUTANEOUS | Status: DC

## 2016-04-30 MED ORDER — ACETAMINOPHEN 500 MG PO TABS
1000.0000 mg | ORAL_TABLET | Freq: Once | ORAL | Status: AC
  Administered 2016-04-30: 1000 mg via ORAL

## 2016-04-30 MED ORDER — PROPOFOL 10 MG/ML IV BOLUS
INTRAVENOUS | Status: DC | PRN
  Administered 2016-04-30: 300 mg via INTRAVENOUS

## 2016-04-30 MED ORDER — MIDAZOLAM HCL 2 MG/2ML IJ SOLN
1.0000 mg | INTRAMUSCULAR | Status: DC | PRN
  Administered 2016-04-30: 1 mg via INTRAVENOUS

## 2016-04-30 MED ORDER — MIDAZOLAM HCL 2 MG/2ML IJ SOLN
INTRAMUSCULAR | Status: AC
  Filled 2016-04-30: qty 2

## 2016-04-30 SURGICAL SUPPLY — 44 items
ASMB BLDE STD STRL DISP ECTR (BLADE) ×1
BANDAGE ACE 3X5.8 VEL STRL LF (GAUZE/BANDAGES/DRESSINGS) ×2 IMPLANT
BLADE SLIMLINE EXTR (BLADE) ×2 IMPLANT
BLADE SURG 15 STRL LF DISP TIS (BLADE) ×1 IMPLANT
BLADE SURG 15 STRL SS (BLADE) ×2
BNDG CMPR 9X4 STRL LF SNTH (GAUZE/BANDAGES/DRESSINGS) ×1
BNDG ESMARK 4X9 LF (GAUZE/BANDAGES/DRESSINGS) ×1 IMPLANT
CHLORAPREP W/TINT 26ML (MISCELLANEOUS) ×2 IMPLANT
CORDS BIPOLAR (ELECTRODE) IMPLANT
COVER BACK TABLE 60X90IN (DRAPES) ×2 IMPLANT
CUFF TOURNIQUET SINGLE 18IN (TOURNIQUET CUFF) ×1 IMPLANT
DRAPE EXTREMITY T 121X128X90 (DRAPE) ×2 IMPLANT
DRAPE IMP U-DRAPE 54X76 (DRAPES) ×3 IMPLANT
DRAPE SURG 17X23 STRL (DRAPES) ×2 IMPLANT
DRSG EMULSION OIL 3X3 NADH (GAUZE/BANDAGES/DRESSINGS) ×2 IMPLANT
DRSG TEGADERM 2-3/8X2-3/4 SM (GAUZE/BANDAGES/DRESSINGS) ×2 IMPLANT
GAUZE SPONGE 4X4 12PLY STRL (GAUZE/BANDAGES/DRESSINGS) ×2 IMPLANT
GLOVE BIO SURGEON STRL SZ7.5 (GLOVE) ×4 IMPLANT
GLOVE BIOGEL PI IND STRL 8 (GLOVE) ×2 IMPLANT
GLOVE BIOGEL PI IND STRL 8.5 (GLOVE) IMPLANT
GLOVE BIOGEL PI INDICATOR 8 (GLOVE) ×2
GLOVE BIOGEL PI INDICATOR 8.5 (GLOVE)
GOWN STRL REUS W/ TWL LRG LVL3 (GOWN DISPOSABLE) ×1 IMPLANT
GOWN STRL REUS W/ TWL XL LVL3 (GOWN DISPOSABLE) ×2 IMPLANT
GOWN STRL REUS W/TWL LRG LVL3 (GOWN DISPOSABLE) ×2
GOWN STRL REUS W/TWL XL LVL3 (GOWN DISPOSABLE) ×4
NDL HYPO 25X1 1.5 SAFETY (NEEDLE) IMPLANT
NEEDLE HYPO 25X1 1.5 SAFETY (NEEDLE) IMPLANT
NS IRRIG 1000ML POUR BTL (IV SOLUTION) ×2 IMPLANT
PACK BASIN DAY SURGERY FS (CUSTOM PROCEDURE TRAY) ×2 IMPLANT
PAD CAST 3X4 CTTN HI CHSV (CAST SUPPLIES) ×1 IMPLANT
PADDING CAST ABS 3INX4YD NS (CAST SUPPLIES)
PADDING CAST ABS 4INX4YD NS (CAST SUPPLIES) ×1
PADDING CAST ABS COTTON 3X4 (CAST SUPPLIES) IMPLANT
PADDING CAST ABS COTTON 4X4 ST (CAST SUPPLIES) ×1 IMPLANT
PADDING CAST COTTON 3X4 STRL (CAST SUPPLIES) ×2
SOLUTION ANTI FOG 6CC (MISCELLANEOUS) IMPLANT
SPLINT PLASTER CAST XFAST 3X15 (CAST SUPPLIES) IMPLANT
SPLINT PLASTER XTRA FASTSET 3X (CAST SUPPLIES)
SUT ETHILON 3 0 PS 1 (SUTURE) ×2 IMPLANT
SYR CONTROL 10ML LL (SYRINGE) ×2 IMPLANT
TOWEL OR 17X24 6PK STRL BLUE (TOWEL DISPOSABLE) ×2 IMPLANT
TOWEL OR NON WOVEN STRL DISP B (DISPOSABLE) ×2 IMPLANT
UNDERPAD 30X30 (UNDERPADS AND DIAPERS) ×2 IMPLANT

## 2016-04-30 NOTE — Interval H&P Note (Signed)
History and Physical Interval Note:  04/30/2016 3:52 PM  Oscar Myers  has presented today for surgery, with the diagnosis of CARPAL TUNNEL SYNDROME LEFT UPPER LIMB  The various methods of treatment have been discussed with the patient and family. After consideration of risks, benefits and other options for treatment, the patient has consented to  Procedure(s): LEFT CARPAL TUNNEL RELEASE ENDOSCOPIC (Left) as a surgical intervention .  The patient's history has been reviewed, patient examined, no change in status, stable for surgery.  I have reviewed the patient's chart and labs.  Questions were answered to the patient's satisfaction.     Contrina Orona D

## 2016-04-30 NOTE — Anesthesia Postprocedure Evaluation (Signed)
Anesthesia Post Note  Patient: Oscar Myers  Procedure(s) Performed: Procedure(s) (LRB): LEFT CARPAL TUNNEL RELEASE ENDOSCOPIC (Left)  Patient location during evaluation: PACU Anesthesia Type: General Level of consciousness: awake and alert Pain management: pain level controlled Vital Signs Assessment: post-procedure vital signs reviewed and stable Respiratory status: spontaneous breathing, nonlabored ventilation, respiratory function stable and patient connected to nasal cannula oxygen Cardiovascular status: blood pressure returned to baseline and stable Postop Assessment: no signs of nausea or vomiting Anesthetic complications: no       Last Vitals:  Vitals:   04/30/16 1700 04/30/16 1726  BP: 116/82 134/86  Pulse: 77 78  Resp: 18 18  Temp:  36.6 C    Last Pain:  Vitals:   04/30/16 1726  TempSrc: Oral  PainSc:                  Shelton SilvasKevin D Zayda Angell

## 2016-04-30 NOTE — Transfer of Care (Signed)
Immediate Anesthesia Transfer of Care Note  Patient: Oscar Myers  Procedure(s) Performed: Procedure(s): LEFT CARPAL TUNNEL RELEASE ENDOSCOPIC (Left)  Patient Location: PACU  Anesthesia Type:General  Level of Consciousness: awake, sedated and responds to stimulation  Airway & Oxygen Therapy: Patient Spontanous Breathing and Patient connected to face mask oxygen  Post-op Assessment: Report given to RN, Post -op Vital signs reviewed and stable and Patient moving all extremities  Post vital signs: Reviewed and stable  Last Vitals:  Vitals:   04/30/16 1344  BP: (!) 142/83  Pulse: 85  Resp: 18  Temp: 36.9 C    Last Pain:  Vitals:   04/30/16 1344  TempSrc: Oral         Complications: No apparent anesthesia complications

## 2016-04-30 NOTE — Anesthesia Procedure Notes (Signed)
Procedure Name: LMA Insertion Date/Time: 04/30/2016 4:06 PM Performed by: Curly ShoresRAFT, Wadie Mattie W Pre-anesthesia Checklist: Patient identified, Emergency Drugs available, Suction available and Patient being monitored Patient Re-evaluated:Patient Re-evaluated prior to inductionOxygen Delivery Method: Circle system utilized Preoxygenation: Pre-oxygenation with 100% oxygen Intubation Type: IV induction Ventilation: Mask ventilation without difficulty LMA: LMA inserted LMA Size: 5.0 Number of attempts: 1 Airway Equipment and Method: Bite block Placement Confirmation: positive ETCO2 and breath sounds checked- equal and bilateral Tube secured with: Tape Dental Injury: Teeth and Oropharynx as per pre-operative assessment

## 2016-04-30 NOTE — Discharge Instructions (Addendum)
Post Anesthesia Home Care Instructions  Activity: Get plenty of rest for the remainder of the day. A responsible adult should stay with you for 24 hours following the procedure.  For the next 24 hours, DO NOT: -Drive a car -Advertising copywriterperate machinery -Drink alcoholic beverages -Take any medication unless instructed by your physician -Make any legal decisions or sign important papers.  Meals: Start with liquid foods such as gelatin or soup. Progress to regular foods as tolerated. Avoid greasy, spicy, heavy foods. If nausea and/or vomiting occur, drink only clear liquids until the nausea and/or vomiting subsides. Call your physician if vomiting continues.  Special Instructions/Symptoms: Your throat may feel dry or sore from the anesthesia or the breathing tube placed in your throat during surgery. If this causes discomfort, gargle with warm salt water. The discomfort should disappear within 24 hours.  If you had a scopolamine patch placed behind your ear for the management of post- operative nausea and/or vomiting:  1. The medication in the patch is effective for 72 hours, after which it should be removed.  Wrap patch in a tissue and discard in the trash. Wash hands thoroughly with soap and water. 2. You may remove the patch earlier than 72 hours if you experience unpleasant side effects which may include dry mouth, dizziness or visual disturbances. 3. Avoid touching the patch. Wash your hands with soap and water after contact with the patch.   Call your surgeon if you experience:   1.  Fever over 101.0. 2.  Inability to urinate. 3.  Nausea and/or vomiting. 4.  Extreme swelling or bruising at the surgical site. 5.  Continued bleeding from the incision. 6.  Increased pain, redness or drainage from the incision. 7.  Problems related to your pain medication. 8.  Any problems and/or concernsLeave dressings on and dry for 3 days.  You may then remove and shower over incision.  Do not submerge  wound.  Diet: As you were doing prior to hospitalization   Activity:  Increase activity slowly as tolerated, but follow the weight bearing instructions below.  The rules on driving is that you can not be taking narcotics while you drive, and you must feel in control of the vehicle.    Weight Bearing:   Limit strenuous use of left hand until cleared at follow up.   To prevent constipation: you may use a stool softener such as -  Colace (over the counter) 100 mg by mouth twice a day  Drink plenty of fluids (prune juice may be helpful) and high fiber foods Miralax (over the counter) for constipation as needed.    Itching:  If you experience itching with your medications, try taking only a single pain pill, or even half a pain pill at a time.  You may take up to 10 pain pills per day, and you can also use benadryl over the counter for itching or also to help with sleep.   Precautions:  If you experience chest pain or shortness of breath - call 911 immediately for transfer to the hospital emergency department!!  If you develop a fever greater that 101 F, purulent drainage from wound, increased redness or drainage from wound, or calf pain -- Call the office at [phone]  Follow- Up Appointment:  Please call for an appointment to be [place] - [phone]

## 2016-04-30 NOTE — Anesthesia Preprocedure Evaluation (Addendum)
Anesthesia Evaluation  Patient identified by MRN, date of birth, ID band Patient awake    Reviewed: Allergy & Precautions, NPO status , Patient's Chart, lab work & pertinent test results  Airway Mallampati: I  TM Distance: >3 FB Neck ROM: Full    Dental  (+) Teeth Intact, Dental Advisory Given   Pulmonary Current Smoker,    breath sounds clear to auscultation       Cardiovascular negative cardio ROS   Rhythm:Regular Rate:Normal     Neuro/Psych  Neuromuscular disease negative psych ROS   GI/Hepatic negative GI ROS, Neg liver ROS,   Endo/Other  negative endocrine ROS  Renal/GU negative Renal ROS  negative genitourinary   Musculoskeletal negative musculoskeletal ROS (+)   Abdominal   Peds negative pediatric ROS (+)  Hematology negative hematology ROS (+)   Anesthesia Other Findings   Reproductive/Obstetrics negative OB ROS                            Anesthesia Physical Anesthesia Plan  ASA: II  Anesthesia Plan: General   Post-op Pain Management:    Induction: Intravenous  Airway Management Planned: LMA  Additional Equipment:   Intra-op Plan: Utilization of Controlled Hypotension per surrgeon request  Post-operative Plan: Extubation in OR  Informed Consent: I have reviewed the patients History and Physical, chart, labs and discussed the procedure including the risks, benefits and alternatives for the proposed anesthesia with the patient or authorized representative who has indicated his/her understanding and acceptance.   Dental advisory given  Plan Discussed with: CRNA  Anesthesia Plan Comments:        Anesthesia Quick Evaluation

## 2016-05-01 ENCOUNTER — Encounter (HOSPITAL_BASED_OUTPATIENT_CLINIC_OR_DEPARTMENT_OTHER): Payer: Self-pay | Admitting: Orthopedic Surgery

## 2016-05-01 NOTE — Op Note (Signed)
04/30/2016  8:29 AM  PATIENT:  Oscar Myers    PRE-OPERATIVE DIAGNOSIS:  CARPAL TUNNEL SYNDROME LEFT UPPER LIMB  POST-OPERATIVE DIAGNOSIS:  Same  PROCEDURE:  LEFT CARPAL TUNNEL RELEASE ENDOSCOPIC  SURGEON:  Arbadella Kimbler, Jewel BaizeIMOTHY D, MD  PHYSICIAN ASSISTANT: Aquilla HackerHenry Martensen, PA-C, he was present and scrubbed throughout the case, critical for completion in a timely fashion, and for retraction, instrumentation, and closure.   ANESTHESIA:   General  PREOPERATIVE INDICATIONS:  Oscar Myers is a  60 y.o. male with a diagnosis of CARPAL TUNNEL SYNDROME LEFT UPPER LIMB who failed conservative measures and elected for surgical management.    The risks benefits and alternatives were discussed with the patient preoperatively including but not limited to the risks of infection, bleeding, nerve injury, incomplete relief of symptoms, pillar pain, cardiopulmonary complications, the need for revision surgery, among others, and the patient was willing to proceed.  OPERATIVE PROCEDURE: Patient was identified in the preoperative holding area and site was marked by me. male was transported to the operating theater and placed on the table in the supine position taking care to pad all bony prominences. After appropriate time out general anesthesia was induced and male received ancef for preoperative antibiotics. The extremity was prepped and draped in normal sterile fashion.  I made a 1 severe incision just proximal to the dominant volar wrist crease in line with the radial aspect of the small finger. Spread down to the fascia and this was incised in line with the incision. Metzenbaum scissors were then prepped passed above and below the fascia proximally to clear the nerve away from the fascia clear soft tissue plane superficial to it as well. This was then incised.  I then turned attention distally where the fascia edge was as was visible within the wound. I sequentially dilated the carpal tunnel sweeping  soft tissue off of the undersurface of the ligament which was palpable throughout each past. I then inserted the endoscope and under direct visualization was able to see the undersurface the carpal tunnel throughout the entire insertion. Wasn't visualized the distal end of the ligament I put the blade retracted the instrument incising the transverse carpal ligament. I then reinserted the endoscope and directly visualized each cut and as well as intact median nerve without harm.   Wound was then irrigated and closed with a superficial suture. A sterile dressing and splint was applied   He tolerated this well, with no complications.  POSTOPERATIVE PLAN: Keep the splint clean and dry. VTE prophylaxis will consist of ambulation and foot pump exercises

## 2016-05-20 ENCOUNTER — Encounter: Payer: Self-pay | Admitting: Neurology

## 2017-05-15 IMAGING — CR DG HAND COMPLETE 3+V*L*
3 series · 3 of 3 positions shown · non-contrast
Comparison: None.

CLINICAL DATA: Left hand paresthesias. Left hand pain. Left hand
swelling.

EXAM:
LEFT HAND - COMPLETE 3+ VIEW

[x hand pa left]
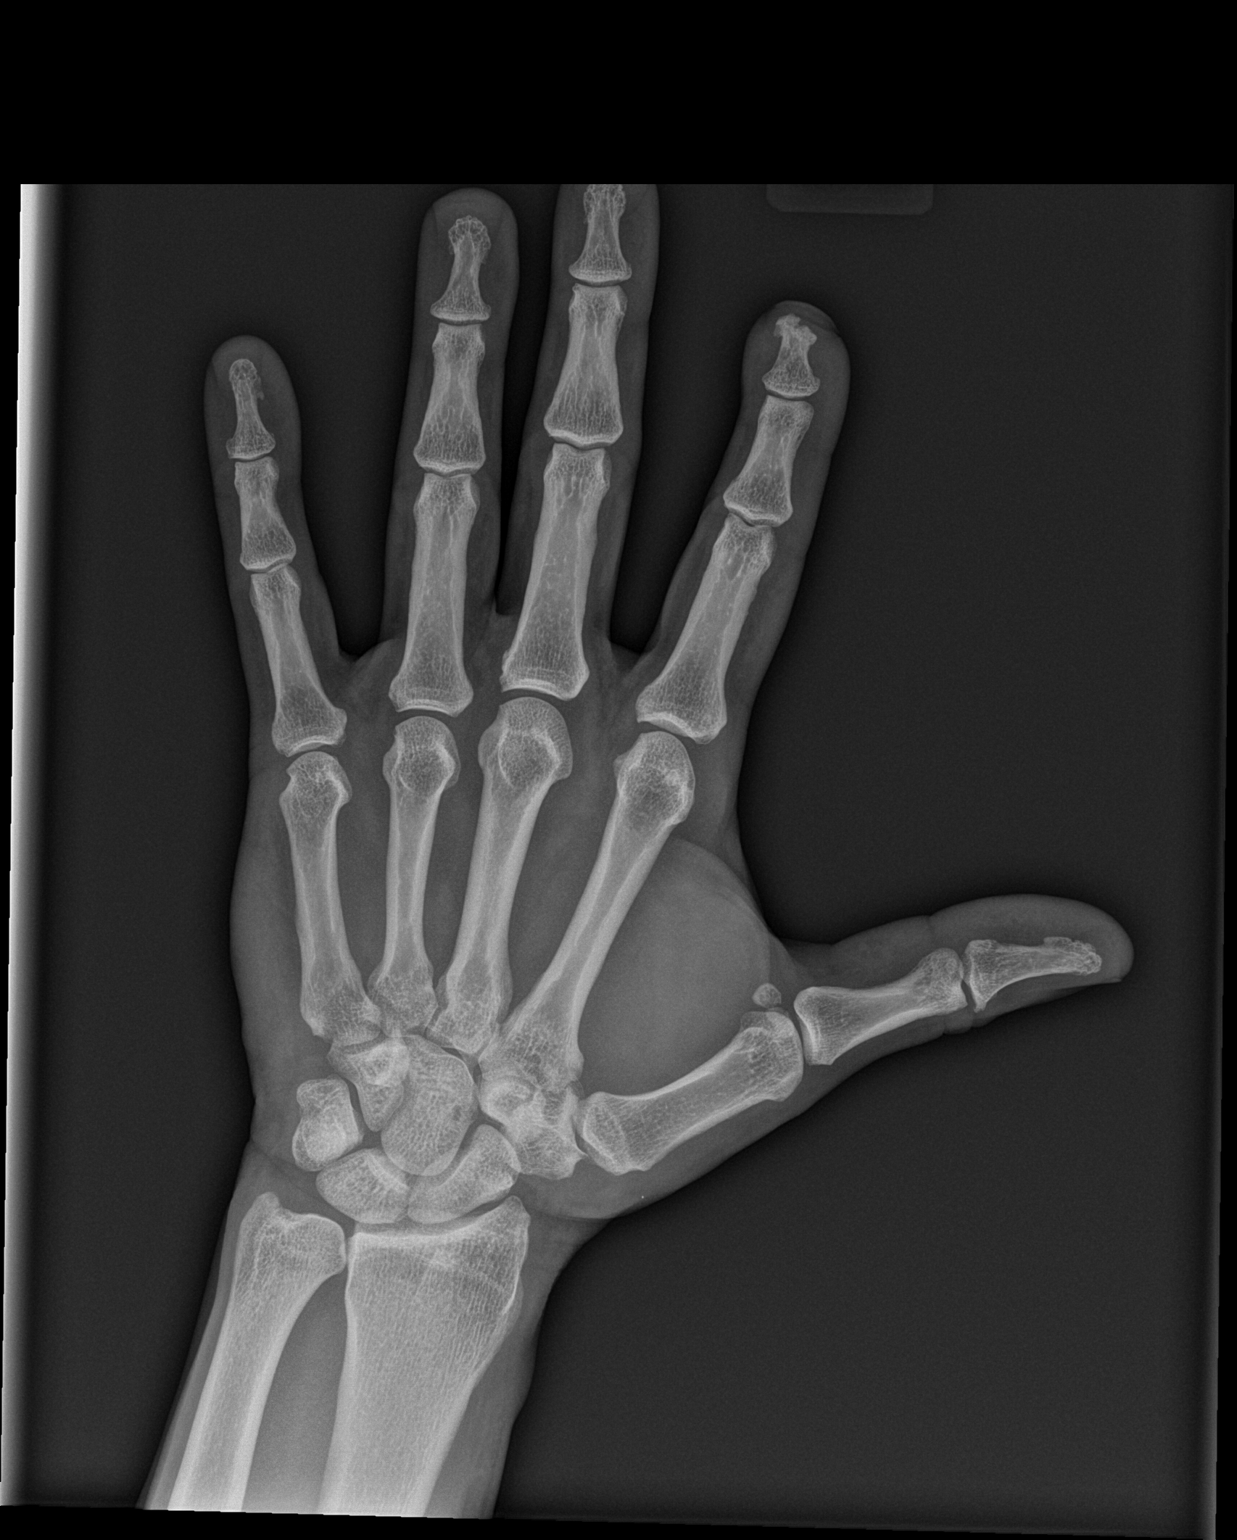

[x hand obl left]
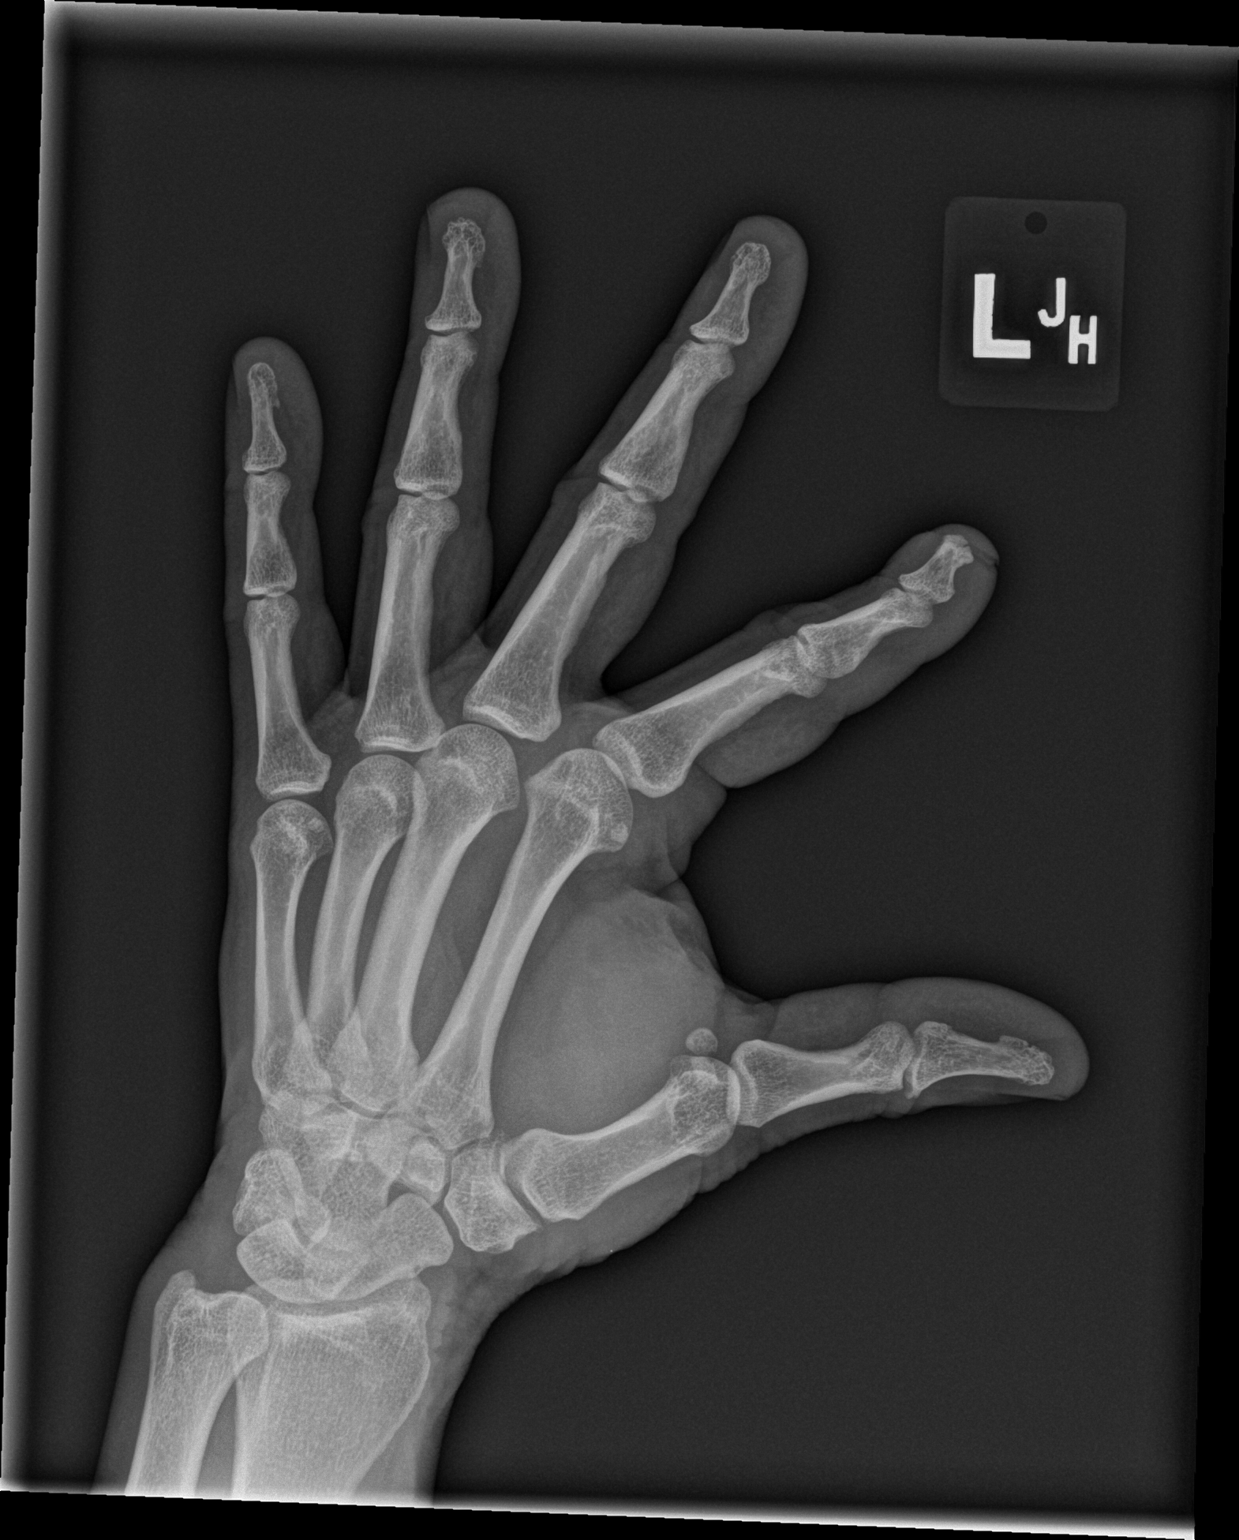

[x hand lat left]
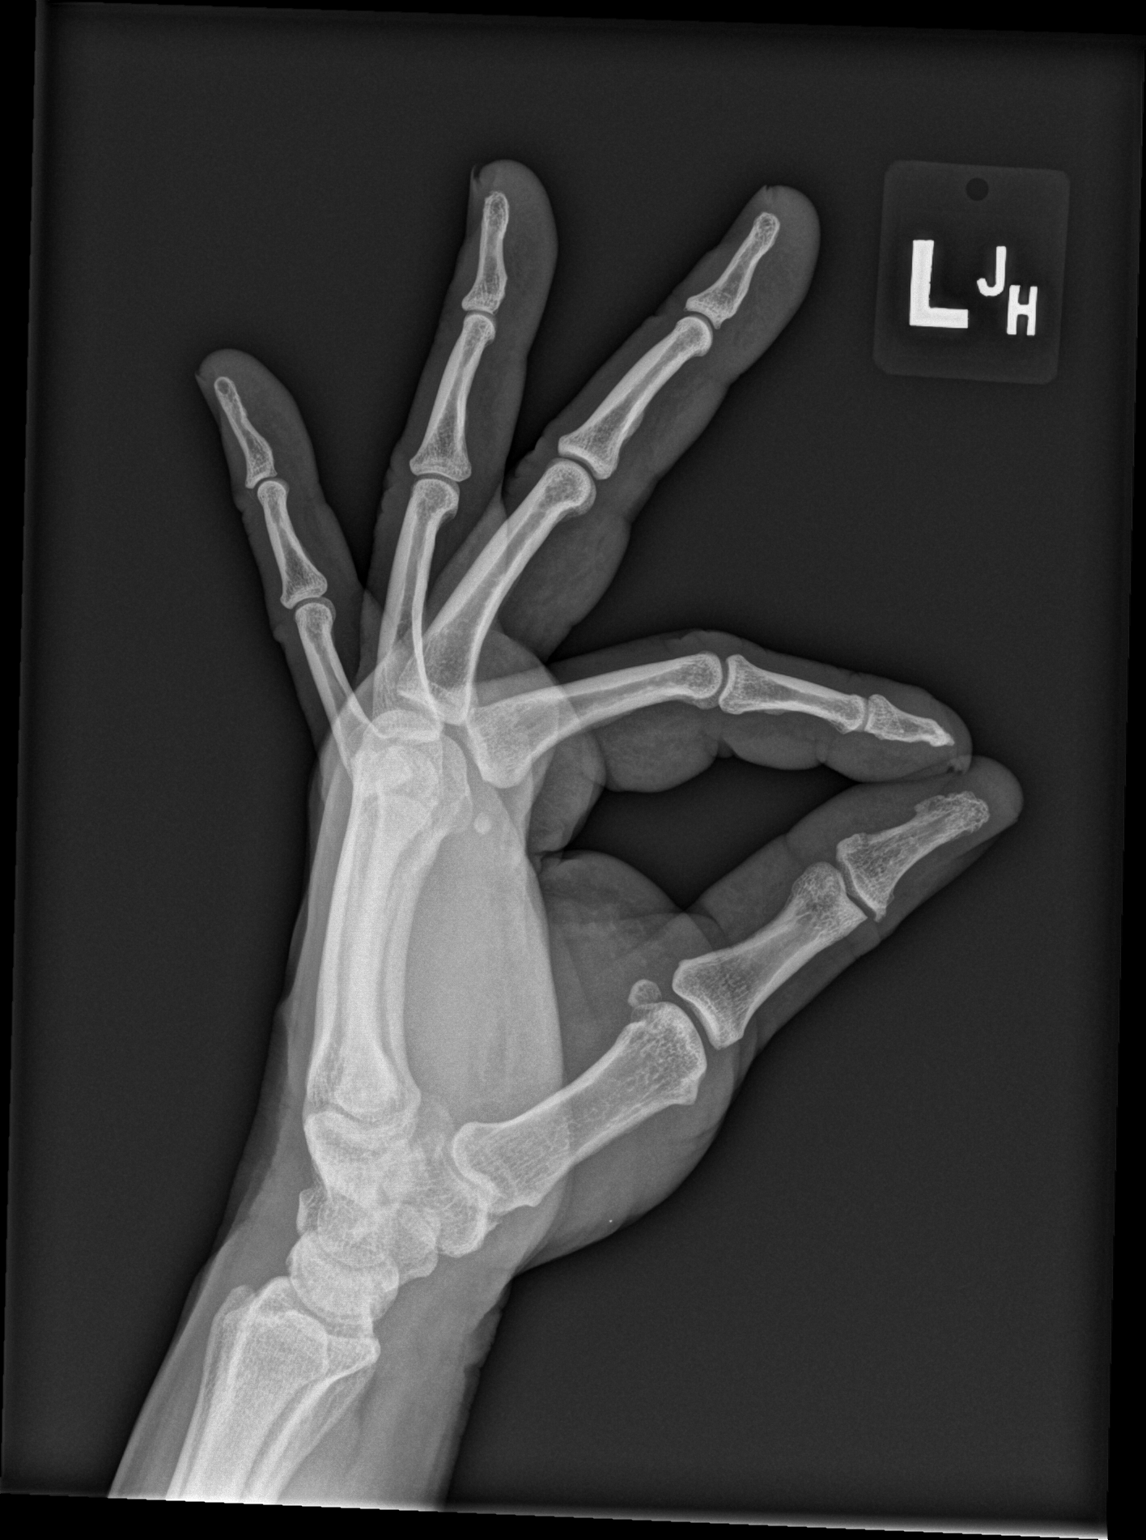

[3 of 3 positions shown; findings below may reference images not displayed]

FINDINGS: Mild deformity of the index finger tuft may represent an old injury.
Negative for an acute fracture or dislocation. Alignment of the left
hand is normal. No significant joint space narrowing or degenerative
changes.
IMPRESSION: No acute abnormality to the left hand.

## 2018-08-31 HISTORY — PX: COLONOSCOPY: SHX174

## 2018-09-11 LAB — HM COLONOSCOPY

## 2019-11-23 ENCOUNTER — Ambulatory Visit (INDEPENDENT_AMBULATORY_CARE_PROVIDER_SITE_OTHER): Payer: No Typology Code available for payment source | Admitting: Orthopaedic Surgery

## 2019-11-23 ENCOUNTER — Encounter: Payer: Self-pay | Admitting: Orthopaedic Surgery

## 2019-11-23 ENCOUNTER — Ambulatory Visit (INDEPENDENT_AMBULATORY_CARE_PROVIDER_SITE_OTHER): Payer: No Typology Code available for payment source

## 2019-11-23 VITALS — Ht 69.5 in | Wt 189.6 lb

## 2019-11-23 DIAGNOSIS — M25512 Pain in left shoulder: Secondary | ICD-10-CM

## 2019-11-23 DIAGNOSIS — G8929 Other chronic pain: Secondary | ICD-10-CM | POA: Diagnosis not present

## 2019-11-24 ENCOUNTER — Encounter: Payer: Self-pay | Admitting: Orthopaedic Surgery

## 2019-11-24 ENCOUNTER — Ambulatory Visit (INDEPENDENT_AMBULATORY_CARE_PROVIDER_SITE_OTHER): Payer: No Typology Code available for payment source | Admitting: Orthopaedic Surgery

## 2019-11-24 VITALS — Ht 70.0 in | Wt 185.0 lb

## 2019-11-24 DIAGNOSIS — G5602 Carpal tunnel syndrome, left upper limb: Secondary | ICD-10-CM

## 2019-11-25 NOTE — Progress Notes (Signed)
Office Visit Note   Patient: Oscar Myers           Date of Birth: 1955/06/18           MRN: [id number] Visit Date: 11/23/2019              Requested by: Ronnald Nian, MD [address] [place],  [place] [address] PCP: Ronnald Nian, MD   Assessment & Plan: Visit Diagnoses:  1. Chronic left shoulder pain     Plan: I looked at the report of the MRI which only stated an incomplete tear of the rotator cuff.  I have asked him to obtain the full radiology report as well as the MRI images themselves so that I can evaluate them in detail in order to formulate a treatment plan.  We will see the patient back in the near future.  Follow-Up Instructions: Return if symptoms worsen or fail to improve.   Orders:  Orders Placed This Encounter  Procedures  . XR Shoulder Left   No orders of the defined types were placed in this encounter.     Procedures: No procedures performed   Clinical Data: No additional findings.   Subjective: Chief Complaint  Patient presents with  . Left Shoulder - Pain    Oscar Myers is 64 year old gentleman [facility] referral for chronic left shoulder pain with recent MRI.  He states that he had surgery in 1979 for chronic dislocations.  He states that he has had numbness and tingling in his left arm and shoulder.  He has had pain for years as well.  He has pain with elevation of his arm above his head.   Review of Systems  Constitutional: Negative.   All other systems reviewed and are negative.    Objective: Vital Signs: Ht 5' 9.5" (1.765 m)   Wt 189 lb 9.6 oz (86 kg)   BMI 27.60 kg/m   Physical Exam Vitals and nursing note reviewed.  Constitutional:      Appearance: He is well-developed.  HENT:     Head: Normocephalic and atraumatic.  Eyes:     Pupils: Pupils are equal, round, and reactive to light.  Pulmonary:     Effort: Pulmonary effort is normal.  Abdominal:     Palpations: Abdomen is soft.  Musculoskeletal:        General:  Normal range of motion.     Cervical back: Neck supple.  Skin:    General: Skin is warm.  Neurological:     Mental Status: He is alert and oriented to person, place, and time.  Psychiatric:        Behavior: Behavior normal.        Thought Content: Thought content normal.        Judgment: Judgment normal.     Ortho Exam Left shoulder shows a fully healed surgical scar.  Exam is significantly limited by guarding.  He has painful passive range of motion to the level of the shoulder.  Manual muscle testing is limited secondary to guarding and lack of participation. Specialty Comments:  No specialty comments available.  Imaging: No results found.   PMFS History: Patient Active Problem List   Diagnosis Date Noted  . Hyperlipidemia with target LDL less than 100 06/23/2012  . Family history of heart disease in male family member before age 41 06/23/2012  . Current smoker 06/23/2012   Past Medical History:  Diagnosis Date  . CTS (carpal tunnel syndrome)   . Dyslipidemia  History reviewed. No pertinent family history.  Past Surgical History:  Procedure Laterality Date  . BACK SURGERY    . CARPAL TUNNEL RELEASE Left 04/30/2016   Procedure: LEFT CARPAL TUNNEL RELEASE ENDOSCOPIC;  Surgeon: Sheral Apley, MD;  Location: [facility];  Service: Orthopedics;  Laterality: Left;  . COLONOSCOPY  2012   Social History   Occupational History  . Not on file  Tobacco Use  . Smoking status: Current Every Day Smoker    Packs/day: 0.50    Years: 25.00    Pack years: 12.50    Types: Cigarettes  . Smokeless tobacco: Never Used  . Tobacco comment: as of 5 9 2012  Substance and Sexual Activity  . Alcohol use: Yes    Comment: socially- 5 per week  . Drug use: No  . Sexual activity: Yes

## 2019-11-25 NOTE — Progress Notes (Signed)
Office Visit Note   Patient: Oscar Myers           Date of Birth: 03-14-1956           MRN: [id number] Visit Date: 11/24/2019              Requested by: Ronnald Nian, MD [address] [place],  [place] [address] PCP: Ronnald Nian, MD   Assessment & Plan: Visit Diagnoses:  1. Left carpal tunnel syndrome     Plan: Impression is recurrent left carpal tunnel syndrome.  Based on our discussion on treatment options the patient has elected to proceed with revision left carpal tunnel release.  He understands that this will be done in an open fashion with a larger incision.  Risk benefits rehab recovery reviewed with the patient in detail.  We look forward to treating him in the operating room in the near future. Total face to face encounter time was greater than 25 minutes and over half of this time was spent in counseling and/or coordination of care.  Follow-Up Instructions: Return if symptoms worsen or fail to improve.   Orders:  No orders of the defined types were placed in this encounter.  No orders of the defined types were placed in this encounter.     Procedures: No procedures performed   Clinical Data: No additional findings.   Subjective: Chief Complaint  Patient presents with  . Left Wrist - Pain, Numbness    Oscar Myers is a 64 year old gentleman who comes in for separate [facility] referral for left carpal tunnel syndrome.  I saw him yesterday for left shoulder pain.  He underwent an endoscopic left carpal tunnel release by Dr. Renaye Rakers in 2017 which he states gave him a couple years of relief but recently he has noticed recurrence of his carpal tunnel symptoms including numbness in the left thumb and long and radial side of his ring finger.  He is not interested in trying any carpal tunnel injections.  He recently had a nerve conduction study which confirmed left carpal tunnel syndrome.   Review of Systems  Constitutional: Negative.   All other systems  reviewed and are negative.    Objective: Vital Signs: Ht 5\' 10"  (1.778 m)   Wt 185 lb (83.9 kg)   BMI 26.54 kg/m   Physical Exam Vitals and nursing note reviewed.  Constitutional:      Appearance: He is well-developed.  Pulmonary:     Effort: Pulmonary effort is normal.  Abdominal:     Palpations: Abdomen is soft.  Skin:    General: Skin is warm.  Neurological:     Mental Status: He is alert and oriented to person, place, and time.  Psychiatric:        Behavior: Behavior normal.        Thought Content: Thought content normal.        Judgment: Judgment normal.     Ortho Exam Left hand shows no muscle atrophy.  He has subjective dysesthesias in the median nerve distribution most prominently in the thumb and long and ring fingers.  He has a fully healed surgical scar in the wrist. Specialty Comments:  No specialty comments available.  Imaging: No results found.   PMFS History: Patient Active Problem List   Diagnosis Date Noted  . Hyperlipidemia with target LDL less than 100 06/23/2012  . Family history of heart disease in male family member before age 36 06/23/2012  . Current smoker 06/23/2012  Past Medical History:  Diagnosis Date  . CTS (carpal tunnel syndrome)   . Dyslipidemia     History reviewed. No pertinent family history.  Past Surgical History:  Procedure Laterality Date  . BACK SURGERY    . CARPAL TUNNEL RELEASE Left 04/30/2016   Procedure: LEFT CARPAL TUNNEL RELEASE ENDOSCOPIC;  Surgeon: Sheral Apley, MD;  Location: [facility];  Service: Orthopedics;  Laterality: Left;  . COLONOSCOPY  2012   Social History   Occupational History  . Not on file  Tobacco Use  . Smoking status: Current Every Day Smoker    Packs/day: 0.50    Years: 25.00    Pack years: 12.50    Types: Cigarettes  . Smokeless tobacco: Never Used  . Tobacco comment: as of 5 9 2012  Substance and Sexual Activity  . Alcohol use: Yes    Comment: socially- 5  per week  . Drug use: No  . Sexual activity: Yes

## 2019-11-30 ENCOUNTER — Telehealth: Payer: Self-pay | Admitting: Orthopaedic Surgery

## 2019-11-30 NOTE — Telephone Encounter (Signed)
Records emailed to [facility] @ [email or web address]

## 2019-12-27 ENCOUNTER — Encounter: Payer: Self-pay | Admitting: Orthopaedic Surgery

## 2019-12-27 ENCOUNTER — Ambulatory Visit (INDEPENDENT_AMBULATORY_CARE_PROVIDER_SITE_OTHER): Payer: No Typology Code available for payment source | Admitting: Orthopaedic Surgery

## 2019-12-27 VITALS — Ht 70.0 in | Wt 193.0 lb

## 2019-12-27 DIAGNOSIS — G5602 Carpal tunnel syndrome, left upper limb: Secondary | ICD-10-CM | POA: Insufficient documentation

## 2019-12-27 DIAGNOSIS — M67922 Unspecified disorder of synovium and tendon, left upper arm: Secondary | ICD-10-CM | POA: Diagnosis not present

## 2019-12-27 DIAGNOSIS — M7502 Adhesive capsulitis of left shoulder: Secondary | ICD-10-CM

## 2019-12-27 NOTE — Progress Notes (Signed)
Office Visit Note   Patient: Oscar Myers           Date of Birth: 1955-05-09           MRN: [id number] Visit Date: 12/27/2019              Requested by: Ronnald Nian, MD [address] [place],  [place] [address] PCP: Ronnald Nian, MD   Assessment & Plan: Visit Diagnoses:  1. Left carpal tunnel syndrome   2. Adhesive capsulitis of left shoulder   3. Tendinopathy of left biceps     Plan: Impression is left carpal tunnel syndrome and left shoulder adhesive capsulitis, biceps tendinopathy, rotator cuff tendinopathy.  I reviewed the MRI report in detail as well as reviewed the images with the patient.  Unfortunately he has been dealing with the shoulder issue for at least a year without any improvement from injections or physical therapy therefore after consideration of his options he would like to proceed with left shoulder manipulation arthroscopic lysis of adhesions and extensive debridement, subacromial decompression and distal clavicle excision.  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  He would also like to have the left carpal tunnel release done concurrently in the operating room.  Questions encouraged and answered.  We look forward to treating him in the operating room in the near future.  Follow-Up Instructions: Return if symptoms worsen or fail to improve.   Orders:  No orders of the defined types were placed in this encounter.  No orders of the defined types were placed in this encounter.     Procedures: No procedures performed   Clinical Data: No additional findings.   Subjective: Chief Complaint  Patient presents with  . Left Shoulder - Pain    Oscar Myers returns today for for follow-up of his left shoulder problems.  He has an MRI and the report that was done at the [place].   Review of Systems   Objective: Vital Signs: Ht 5\' 10"  (1.778 m)   Wt 193 lb (87.5 kg)   BMI 27.69 kg/m   Physical  Exam  Ortho Exam Left shoulder shows external rotation 5 degrees with severe pain, internal rotation to the left hip with pain, forward flexion to 85 degrees with moderate pain.  Manual muscle testing difficult to assess secondary to guarding. Left hand exam is unchanged. Specialty Comments:  No specialty comments available.  Imaging: No results found.   PMFS History: Patient Active Problem List   Diagnosis Date Noted  . Left carpal tunnel syndrome 12/27/2019  . Adhesive capsulitis of left shoulder 12/27/2019  . Tendinopathy of left biceps 12/27/2019  . Hyperlipidemia with target LDL less than 100 06/23/2012  . Family history of heart disease in male family member before age 95 06/23/2012  . Current smoker 06/23/2012   Past Medical History:  Diagnosis Date  . CTS (carpal tunnel syndrome)   . Dyslipidemia     History reviewed. No pertinent family history.  Past Surgical History:  Procedure Laterality Date  . BACK SURGERY    . CARPAL TUNNEL RELEASE Left 04/30/2016   Procedure: LEFT CARPAL TUNNEL RELEASE ENDOSCOPIC;  Surgeon: 05/02/2016, MD;  Location: [facility];  Service: Orthopedics;  Laterality: Left;  . COLONOSCOPY  2012   Social History   Occupational History  . Not on file  Tobacco Use  . Smoking status: Current Every Day Smoker    Packs/day: 0.50  Years: 25.00    Pack years: 12.50    Types: Cigarettes  . Smokeless tobacco: Never Used  . Tobacco comment: as of 5 9 2012  Substance and Sexual Activity  . Alcohol use: Yes    Comment: socially- 5 per week  . Drug use: No  . Sexual activity: Yes

## 2020-01-01 ENCOUNTER — Other Ambulatory Visit: Payer: Self-pay

## 2020-01-02 ENCOUNTER — Telehealth: Payer: Self-pay | Admitting: Orthopaedic Surgery

## 2020-01-02 ENCOUNTER — Other Ambulatory Visit: Payer: Self-pay

## 2020-01-02 ENCOUNTER — Encounter (HOSPITAL_BASED_OUTPATIENT_CLINIC_OR_DEPARTMENT_OTHER): Payer: Self-pay | Admitting: Orthopaedic Surgery

## 2020-01-02 NOTE — Telephone Encounter (Signed)
See message.  Thanks.

## 2020-01-02 NOTE — Telephone Encounter (Signed)
Patient called.   He has a surgery coming up. He said his pharmacy will be closed after the surgery so he wants to know if the rx could be called in advance.   Call back: [phone]

## 2020-01-03 ENCOUNTER — Other Ambulatory Visit: Payer: Self-pay | Admitting: Physician Assistant

## 2020-01-03 MED ORDER — OXYCODONE-ACETAMINOPHEN 5-325 MG PO TABS: 1.0000 | ORAL_TABLET | Freq: Three times a day (TID) | ORAL | 0 refills | Status: DC | PRN

## 2020-01-03 MED ORDER — ONDANSETRON HCL 4 MG PO TABS: 4.0000 mg | ORAL_TABLET | Freq: Three times a day (TID) | ORAL | 0 refills | Status: DC | PRN

## 2020-01-03 NOTE — Telephone Encounter (Signed)
Called patient he states he uses  "[facility] - [place], [place] - [address]"

## 2020-01-03 NOTE — Telephone Encounter (Signed)
I tried to prescribe but it will not let me.  For some reason I feel like he may have to actually pick up narcotic rx.  Will you let me know?  I am happy to print out also. I tried sending in zofran and percocet

## 2020-01-03 NOTE — Telephone Encounter (Signed)
Can you see what pharmacy he wants me to call these in to

## 2020-01-03 NOTE — Telephone Encounter (Signed)
Yes he will need to pick up both Rx. What exactly do you want to prescribe/how many? Will need an actual signature.

## 2020-01-03 NOTE — Telephone Encounter (Signed)
Zofran 4mg  take 1 tab po tid prn nausea #20 no refills Percocet 5-325 take 1-2 tabs po tid prn pain #30 no refills

## 2020-01-04 ENCOUNTER — Telehealth: Payer: Self-pay

## 2020-01-04 NOTE — Telephone Encounter (Signed)
Ready for pick up. Called patient. LMOM

## 2020-01-04 NOTE — Telephone Encounter (Signed)
See message below. What else would you like to prescribe. Has to be written in an Rx pad since he will be going to [place] [facility].

## 2020-01-04 NOTE — Telephone Encounter (Signed)
Patient called he stated he cannot take Percocet, he said last time he took them he had a reaction and passed out. Call [phone].

## 2020-01-05 NOTE — Telephone Encounter (Signed)
States he can take hydrocodone or oxycodone please advise.

## 2020-01-05 NOTE — Telephone Encounter (Signed)
Filled out fo ryou

## 2020-01-05 NOTE — Telephone Encounter (Signed)
What pain meds can he take?

## 2020-01-06 ENCOUNTER — Other Ambulatory Visit (HOSPITAL_COMMUNITY)
Admission: RE | Admit: 2020-01-06 | Discharge: 2020-01-06 | Disposition: A | Discharge status: Home / Self Care | Payer: No Typology Code available for payment source | Source: Ambulatory Visit | Attending: Orthopaedic Surgery | Admitting: Orthopaedic Surgery

## 2020-01-06 DIAGNOSIS — Z01812 Encounter for preprocedural laboratory examination: Secondary | ICD-10-CM | POA: Insufficient documentation

## 2020-01-06 DIAGNOSIS — Z20822 Contact with and (suspected) exposure to covid-19: Secondary | ICD-10-CM | POA: Diagnosis not present

## 2020-01-06 LAB — SARS CORONAVIRUS 2 (TAT 6-24 HRS): SARS Coronavirus 2: NEGATIVE

## 2020-01-10 ENCOUNTER — Ambulatory Visit (HOSPITAL_BASED_OUTPATIENT_CLINIC_OR_DEPARTMENT_OTHER): Payer: No Typology Code available for payment source | Admitting: Certified Registered Nurse Anesthetist

## 2020-01-10 ENCOUNTER — Encounter (HOSPITAL_BASED_OUTPATIENT_CLINIC_OR_DEPARTMENT_OTHER): Payer: Self-pay | Admitting: Orthopaedic Surgery

## 2020-01-10 ENCOUNTER — Other Ambulatory Visit: Payer: Self-pay

## 2020-01-10 ENCOUNTER — Ambulatory Visit (HOSPITAL_BASED_OUTPATIENT_CLINIC_OR_DEPARTMENT_OTHER)
Admission: RE | Admit: 2020-01-10 | Discharge: 2020-01-10 | Disposition: A | Discharge status: Home / Self Care | Payer: No Typology Code available for payment source | Attending: Orthopaedic Surgery | Admitting: Orthopaedic Surgery

## 2020-01-10 ENCOUNTER — Encounter: Payer: Self-pay | Admitting: Orthopaedic Surgery

## 2020-01-10 ENCOUNTER — Encounter (HOSPITAL_BASED_OUTPATIENT_CLINIC_OR_DEPARTMENT_OTHER)
Admission: RE | Disposition: A | Discharge status: Home / Self Care | Payer: Self-pay | Source: Home / Self Care | Attending: Orthopaedic Surgery

## 2020-01-10 DIAGNOSIS — Z79899 Other long term (current) drug therapy: Secondary | ICD-10-CM | POA: Insufficient documentation

## 2020-01-10 DIAGNOSIS — M7502 Adhesive capsulitis of left shoulder: Secondary | ICD-10-CM | POA: Diagnosis not present

## 2020-01-10 DIAGNOSIS — M7542 Impingement syndrome of left shoulder: Secondary | ICD-10-CM | POA: Insufficient documentation

## 2020-01-10 DIAGNOSIS — M65812 Other synovitis and tenosynovitis, left shoulder: Secondary | ICD-10-CM | POA: Insufficient documentation

## 2020-01-10 DIAGNOSIS — E785 Hyperlipidemia, unspecified: Secondary | ICD-10-CM | POA: Insufficient documentation

## 2020-01-10 DIAGNOSIS — G8929 Other chronic pain: Secondary | ICD-10-CM | POA: Insufficient documentation

## 2020-01-10 DIAGNOSIS — G5602 Carpal tunnel syndrome, left upper limb: Secondary | ICD-10-CM | POA: Diagnosis present

## 2020-01-10 DIAGNOSIS — M67922 Unspecified disorder of synovium and tendon, left upper arm: Secondary | ICD-10-CM | POA: Diagnosis present

## 2020-01-10 DIAGNOSIS — M25312 Other instability, left shoulder: Secondary | ICD-10-CM | POA: Insufficient documentation

## 2020-01-10 DIAGNOSIS — Z87891 Personal history of nicotine dependence: Secondary | ICD-10-CM | POA: Insufficient documentation

## 2020-01-10 DIAGNOSIS — M19012 Primary osteoarthritis, left shoulder: Secondary | ICD-10-CM | POA: Diagnosis not present

## 2020-01-10 HISTORY — PX: CARPAL TUNNEL RELEASE: SHX101

## 2020-01-10 HISTORY — PX: SHOULDER ARTHROSCOPY WITH ROTATOR CUFF REPAIR AND SUBACROMIAL DECOMPRESSION: SHX5686

## 2020-01-10 SURGERY — SHOULDER ARTHROSCOPY WITH ROTATOR CUFF REPAIR AND SUBACROMIAL DECOMPRESSION
Anesthesia: General | Site: Shoulder | Laterality: Left

## 2020-01-10 MED ORDER — EPINEPHRINE PF 1 MG/ML IJ SOLN
INTRAMUSCULAR | Status: DC | PRN
  Administered 2020-01-10: 1 mg

## 2020-01-10 MED ORDER — FENTANYL CITRATE (PF) 100 MCG/2ML IJ SOLN
INTRAMUSCULAR | Status: DC | PRN
Start: 2020-01-10 — End: 2020-01-10
  Administered 2020-01-10 (×2): 50 ug via INTRAVENOUS

## 2020-01-10 MED ORDER — BUPIVACAINE LIPOSOME 1.3 % IJ SUSP
INTRAMUSCULAR | Status: DC | PRN
  Administered 2020-01-10: 10 mL via PERINEURAL

## 2020-01-10 MED ORDER — OXYCODONE HCL 5 MG/5ML PO SOLN: 5.0000 mg | Freq: Once | ORAL | Status: DC | PRN

## 2020-01-10 MED ORDER — PROPOFOL 10 MG/ML IV BOLUS
INTRAVENOUS | Status: DC | PRN
  Administered 2020-01-10: 200 mg via INTRAVENOUS

## 2020-01-10 MED ORDER — BUPIVACAINE HCL (PF) 0.5 % IJ SOLN
INTRAMUSCULAR | Status: DC | PRN
  Administered 2020-01-10: 20 mL via PERINEURAL

## 2020-01-10 MED ORDER — MIDAZOLAM HCL 2 MG/2ML IJ SOLN
1.0000 mg | Freq: Once | INTRAMUSCULAR | Status: AC
  Administered 2020-01-10: 2 mg via INTRAVENOUS

## 2020-01-10 MED ORDER — HYDROMORPHONE HCL 1 MG/ML IJ SOLN: 0.2500 mg | INTRAMUSCULAR | Status: DC | PRN

## 2020-01-10 MED ORDER — PHENYLEPHRINE 40 MCG/ML (10ML) SYRINGE FOR IV PUSH (FOR BLOOD PRESSURE SUPPORT)
PREFILLED_SYRINGE | INTRAVENOUS | Status: AC
  Filled 2020-01-10: qty 10

## 2020-01-10 MED ORDER — FENTANYL CITRATE (PF) 100 MCG/2ML IJ SOLN
INTRAMUSCULAR | Status: AC
  Filled 2020-01-10: qty 2

## 2020-01-10 MED ORDER — SODIUM CHLORIDE 0.9 % IR SOLN
Status: DC | PRN
  Administered 2020-01-10: 6000 mL

## 2020-01-10 MED ORDER — MIDAZOLAM HCL 2 MG/2ML IJ SOLN
INTRAMUSCULAR | Status: AC
  Filled 2020-01-10: qty 2

## 2020-01-10 MED ORDER — DEXAMETHASONE SODIUM PHOSPHATE 10 MG/ML IJ SOLN
INTRAMUSCULAR | Status: DC | PRN
  Administered 2020-01-10: 10 mg via INTRAVENOUS

## 2020-01-10 MED ORDER — EPHEDRINE SULFATE-NACL 50-0.9 MG/10ML-% IV SOSY
PREFILLED_SYRINGE | INTRAVENOUS | Status: DC | PRN
  Administered 2020-01-10 (×2): 10 mg via INTRAVENOUS

## 2020-01-10 MED ORDER — LACTATED RINGERS IV SOLN: INTRAVENOUS | Status: DC

## 2020-01-10 MED ORDER — CEFAZOLIN SODIUM-DEXTROSE 2-4 GM/100ML-% IV SOLN
2.0000 g | INTRAVENOUS | Status: AC
  Administered 2020-01-10: 2 g via INTRAVENOUS

## 2020-01-10 MED ORDER — ONDANSETRON HCL 4 MG/2ML IJ SOLN
INTRAMUSCULAR | Status: DC | PRN
  Administered 2020-01-10: 4 mg via INTRAVENOUS

## 2020-01-10 MED ORDER — PHENYLEPHRINE 40 MCG/ML (10ML) SYRINGE FOR IV PUSH (FOR BLOOD PRESSURE SUPPORT)
PREFILLED_SYRINGE | INTRAVENOUS | Status: DC | PRN
  Administered 2020-01-10: 80 ug via INTRAVENOUS
  Administered 2020-01-10 (×2): 120 ug via INTRAVENOUS

## 2020-01-10 MED ORDER — ONDANSETRON HCL 4 MG/2ML IJ SOLN
INTRAMUSCULAR | Status: AC
  Filled 2020-01-10: qty 2

## 2020-01-10 MED ORDER — DEXAMETHASONE SODIUM PHOSPHATE 10 MG/ML IJ SOLN
INTRAMUSCULAR | Status: AC
  Filled 2020-01-10: qty 1

## 2020-01-10 MED ORDER — CEFAZOLIN SODIUM-DEXTROSE 2-4 GM/100ML-% IV SOLN
INTRAVENOUS | Status: AC
  Filled 2020-01-10: qty 100

## 2020-01-10 MED ORDER — LIDOCAINE 2% (20 MG/ML) 5 ML SYRINGE
INTRAMUSCULAR | Status: DC | PRN
  Administered 2020-01-10: 40 mg via INTRAVENOUS

## 2020-01-10 MED ORDER — OXYCODONE HCL 5 MG PO TABS: 5.0000 mg | ORAL_TABLET | Freq: Once | ORAL | Status: DC | PRN

## 2020-01-10 MED ORDER — PROPOFOL 500 MG/50ML IV EMUL
INTRAVENOUS | Status: AC
  Filled 2020-01-10: qty 50

## 2020-01-10 MED ORDER — LIDOCAINE 2% (20 MG/ML) 5 ML SYRINGE
INTRAMUSCULAR | Status: AC
  Filled 2020-01-10: qty 5

## 2020-01-10 MED ORDER — AMISULPRIDE (ANTIEMETIC) 5 MG/2ML IV SOLN: 10.0000 mg | Freq: Once | INTRAVENOUS | Status: DC | PRN

## 2020-01-10 MED ORDER — MEPERIDINE HCL 25 MG/ML IJ SOLN: 6.2500 mg | INTRAMUSCULAR | Status: DC | PRN

## 2020-01-10 MED ORDER — FENTANYL CITRATE (PF) 100 MCG/2ML IJ SOLN
50.0000 ug | Freq: Once | INTRAMUSCULAR | Status: AC
  Administered 2020-01-10: 100 ug via INTRAVENOUS

## 2020-01-10 MED ORDER — PROMETHAZINE HCL 25 MG/ML IJ SOLN: 6.2500 mg | INTRAMUSCULAR | Status: DC | PRN

## 2020-01-10 SURGICAL SUPPLY — 87 items
APL SKNCLS STERI-STRIP NONHPOA (GAUZE/BANDAGES/DRESSINGS)
BAND INSRT 18 STRL LF DISP RB (MISCELLANEOUS) ×4
BAND RUBBER #18 3X1/16 STRL (MISCELLANEOUS) ×6 IMPLANT
BENZOIN TINCTURE PRP APPL 2/3 (GAUZE/BANDAGES/DRESSINGS) IMPLANT
BLADE EXCALIBUR 4.0X13 (MISCELLANEOUS) ×1 IMPLANT
BLADE MINI RND TIP GREEN BEAV (BLADE) ×3 IMPLANT
BLADE SURG 15 STRL LF DISP TIS (BLADE) ×2 IMPLANT
BLADE SURG 15 STRL SS (BLADE) ×3
BNDG CMPR 9X4 STRL LF SNTH (GAUZE/BANDAGES/DRESSINGS) ×2
BNDG ELASTIC 3X5.8 VLCR STR LF (GAUZE/BANDAGES/DRESSINGS) ×3 IMPLANT
BNDG ELASTIC 4X5.8 VLCR STR LF (GAUZE/BANDAGES/DRESSINGS) ×1 IMPLANT
BNDG ESMARK 4X9 LF (GAUZE/BANDAGES/DRESSINGS) ×3 IMPLANT
BNDG PLASTER X FAST 3X3 WHT LF (CAST SUPPLIES) IMPLANT
BNDG PLSTR 9X3 FST ST WHT (CAST SUPPLIES)
BRUSH SCRUB EZ PLAIN DRY (MISCELLANEOUS) ×3 IMPLANT
BURR OVAL 8 FLU 4.0X13 (MISCELLANEOUS) ×3 IMPLANT
CANNULA 5.75X71 LONG (CANNULA) ×3 IMPLANT
CANNULA SHOULDER 7CM (CANNULA) ×3 IMPLANT
CANNULA TWIST IN 8.25X7CM (CANNULA) IMPLANT
CANNULA TWIST IN 8.25X9CM (CANNULA) IMPLANT
CLSR STERI-STRIP ANTIMIC 1/2X4 (GAUZE/BANDAGES/DRESSINGS) IMPLANT
CORD BIPOLAR FORCEPS 12FT (ELECTRODE) ×3 IMPLANT
COVER BACK TABLE 60X90IN (DRAPES) ×3 IMPLANT
COVER MAYO STAND STRL (DRAPES) ×3 IMPLANT
COVER WAND RF STERILE (DRAPES) IMPLANT
CUFF TOURN SGL QUICK 18X4 (TOURNIQUET CUFF) ×1 IMPLANT
DECANTER SPIKE VIAL GLASS SM (MISCELLANEOUS) IMPLANT
DISSECTOR  3.8MM X 13CM (MISCELLANEOUS) ×3
DISSECTOR 3.8MM X 13CM (MISCELLANEOUS) ×2 IMPLANT
DRAPE INCISE IOBAN 66X45 STRL (DRAPES) ×3 IMPLANT
DRAPE STERI 35X30 U-POUCH (DRAPES) ×3 IMPLANT
DRAPE SURG 17X23 STRL (DRAPES) ×3 IMPLANT
DRAPE U-SHAPE 47X51 STRL (DRAPES) ×3 IMPLANT
DRAPE U-SHAPE 76X120 STRL (DRAPES) ×6 IMPLANT
DRSG PAD ABDOMINAL 8X10 ST (GAUZE/BANDAGES/DRESSINGS) ×5 IMPLANT
DURAPREP 26ML APPLICATOR (WOUND CARE) ×5 IMPLANT
ELECT REM PT RETURN 9FT ADLT (ELECTROSURGICAL)
ELECTRODE REM PT RTRN 9FT ADLT (ELECTROSURGICAL) IMPLANT
GAUZE 4X4 16PLY RFD (DISPOSABLE) IMPLANT
GAUZE SPONGE 4X4 12PLY STRL (GAUZE/BANDAGES/DRESSINGS) ×6 IMPLANT
GAUZE XEROFORM 1X8 LF (GAUZE/BANDAGES/DRESSINGS) ×4 IMPLANT
GLOVE BIOGEL PI IND STRL 7.0 (GLOVE) ×2 IMPLANT
GLOVE BIOGEL PI INDICATOR 7.0 (GLOVE) ×1
GLOVE ECLIPSE 7.0 STRL STRAW (GLOVE) ×3 IMPLANT
GLOVE SKINSENSE NS SZ7.5 (GLOVE) ×1
GLOVE SKINSENSE STRL SZ7.5 (GLOVE) ×2 IMPLANT
GLOVE SURG SYN 7.5  E (GLOVE) ×3
GLOVE SURG SYN 7.5 E (GLOVE) ×2 IMPLANT
GLOVE SURG SYN 7.5 PF PI (GLOVE) ×2 IMPLANT
GOWN STRL REIN XL XLG (GOWN DISPOSABLE) ×3 IMPLANT
GOWN STRL REUS W/ TWL LRG LVL3 (GOWN DISPOSABLE) ×2 IMPLANT
GOWN STRL REUS W/ TWL XL LVL3 (GOWN DISPOSABLE) ×4 IMPLANT
GOWN STRL REUS W/TWL LRG LVL3 (GOWN DISPOSABLE) ×3
GOWN STRL REUS W/TWL XL LVL3 (GOWN DISPOSABLE) ×6
MANIFOLD NEPTUNE II (INSTRUMENTS) ×3 IMPLANT
NDL HYPO 25X1 1.5 SAFETY (NEEDLE) IMPLANT
NDL SCORPION MULTI FIRE (NEEDLE) IMPLANT
NEEDLE HYPO 25X1 1.5 SAFETY (NEEDLE) IMPLANT
NEEDLE SCORPION MULTI FIRE (NEEDLE) IMPLANT
NS IRRIG 1000ML POUR BTL (IV SOLUTION) ×3 IMPLANT
PACK ARTHROSCOPY DSU (CUSTOM PROCEDURE TRAY) ×3 IMPLANT
PACK BASIN DAY SURGERY FS (CUSTOM PROCEDURE TRAY) ×3 IMPLANT
PAD CAST 3X4 CTTN HI CHSV (CAST SUPPLIES) ×2 IMPLANT
PADDING CAST COTTON 3X4 STRL (CAST SUPPLIES) ×3
PORT APPOLLO RF 90DEGREE MULTI (SURGICAL WAND) ×3 IMPLANT
SLEEVE SCD COMPRESS KNEE MED (MISCELLANEOUS) ×3 IMPLANT
SLING ARM FOAM STRAP LRG (SOFTGOODS) IMPLANT
SLING ARM FOAM STRAP XLG (SOFTGOODS) ×1 IMPLANT
STOCKINETTE 4X48 STRL (DRAPES) ×3 IMPLANT
SUT ETHILON 3 0 PS 1 (SUTURE) ×3 IMPLANT
SUT FIBERWIRE #2 38 T-5 BLUE (SUTURE)
SUT TIGER TAPE 7 IN WHITE (SUTURE) IMPLANT
SUTURE FIBERWR #2 38 T-5 BLUE (SUTURE) IMPLANT
SUTURE TAPE 1.3 40 TPR END (SUTURE) IMPLANT
SUTURE TAPE TIGERLINK 1.3MM BL (SUTURE) IMPLANT
SUTURETAPE 1.3 40 TPR END (SUTURE)
SUTURETAPE TIGERLINK 1.3MM BL (SUTURE)
SYR 50ML LL SCALE MARK (SYRINGE) ×3 IMPLANT
SYR BULB EAR ULCER 3OZ GRN STR (SYRINGE) ×3 IMPLANT
SYR CONTROL 10ML LL (SYRINGE) IMPLANT
TAPE CLOTH SURG 6X10 WHT LF (GAUZE/BANDAGES/DRESSINGS) ×1 IMPLANT
TAPE FIBER 2MM 7IN #2 BLUE (SUTURE) IMPLANT
TOWEL GREEN STERILE FF (TOWEL DISPOSABLE) ×3 IMPLANT
TUBE CONNECTING 20X1/4 (TUBING) ×3 IMPLANT
TUBING ARTHROSCOPY IRRIG 16FT (MISCELLANEOUS) ×3 IMPLANT
UNDERPAD 30X36 HEAVY ABSORB (UNDERPADS AND DIAPERS) ×2 IMPLANT
WATER STERILE IRR 1000ML POUR (IV SOLUTION) ×3 IMPLANT

## 2020-01-10 NOTE — Discharge Instructions (Signed)
Post-operative patient instructions  Shoulder Arthroscopy    Ice:  Place intermittent ice or cooler pack over your shoulder, 30 minutes on and 30 minutes off.  Continue this for the first 72 hours after surgery, then save ice for use after therapy sessions or on more active days.    Weight:  You may bear weight on your arm as your symptoms allow.  Motion:  Perform gentle shoulder motion as tolerated  Dressing:  Perform 1st dressing change at 2 days postoperative. A moderate amount of blood tinged drainage is to be expected.  So if you bleed through the dressing on the first or second day or if you have fevers, it is fine to change the dressing/check the wounds early and redress wound.  If it bleeds through again, or if the incisions are leaking frank blood, please call the office. May change dressing every 1-2 days thereafter to help watch wounds. Can purchase Tegaderm (or 52M Nexcare) water resistant dressings at local pharmacy / [facility].  Shower:  Light shower is ok after 2 days.  Please take shower, NO bath. Recover with gauze and ace wrap to help keep wounds protected.    Pain medication:  A narcotic pain medication has been prescribed.  Take as directed.  Typically you need narcotic pain medication more regularly during the first 3 to 5 days after surgery.  Decrease your use of the medication as the pain improves.  Narcotics can sometimes cause constipation, even after a few doses.  If you have problems with constipation, you can take an over the counter stool softener or light laxative.  If you have persistent problems, please notify your physicians office.  Physical therapy: Additional activity guidelines to be provided by your physician or physical therapist at follow-up visits.   Driving: Do not recommend driving x 2 weeks post surgical, especially if surgery performed on right side. Should not drive while taking narcotic pain medications. It typically takes at least 2 weeks to  restore sufficient neuromuscular function for normal reaction times for driving safety.   Call [phone] for questions or problems. Evenings you will be forwarded to the hospital operator.  Ask for the orthopaedic physician on call. Please call if you experience:    o Redness, foul smelling, or persistent drainage from the surgical site  o worsening shoulder pain and swelling not responsive to medication  o any calf pain and or swelling of the lower leg  o temperatures greater than 101.5 F o other questions or concerns   Thank you for allowing [place] to be a part of your care.   Postoperative instructions:  Weightbearing instructions: no lifting more than 10 lbs for 4 weeks  Dressing instructions: Keep your dressing and/or splint clean and dry at all times.  It will be removed at your first post-operative appointment.  Your stitches and/or staples will be removed at this visit.  Incision instructions:  Do not soak your incision for 3 weeks after surgery.  If the incision gets wet, pat dry and do not scrub the incision.  Pain control:  You have been given a prescription to be taken as directed for post-operative pain control.  In addition, elevate the operative extremity above the heart at all times to prevent swelling and throbbing pain.  Take over-the-counter Colace, 100mg  by mouth twice a day while taking narcotic pain medications to help prevent constipation.  Follow up appointments: 1) 7 days for wound check. 2) Dr. as scheduled.   -------------------------------------------------------------------------------------------------------------  After Surgery Pain Control:  After your surgery, post-surgical discomfort or pain is likely. This discomfort can last several days to a few weeks. At certain times of the day your discomfort may be more intense.  Did you receive a nerve block?  A nerve block can provide pain relief for one hour to two days after your surgery. As long as  the nerve block is working, you will experience little or no sensation in the area the surgeon operated on.  As the nerve block wears off, you will begin to experience pain or discomfort. It is very important that you begin taking your prescribed pain medication before the nerve block fully wears off. Treating your pain at the first sign of the block wearing off will ensure your pain is better controlled and more tolerable when full-sensation returns. Do not wait until the pain is intolerable, as the medicine will be less effective. It is better to treat pain in advance than to try and catch up.  General Anesthesia:  If you did not receive a nerve block during your surgery, you will need to start taking your pain medication shortly after your surgery and should continue to do so as prescribed by your surgeon.  Pain Medication:  Most commonly we prescribe Vicodin and Percocet for post-operative pain. Both of these medications contain a combination of acetaminophen (Tylenol) and a narcotic to help control pain.   It takes between 30 and 45 minutes before pain medication starts to work. It is important to take your medication before your pain level gets too intense.   Nausea is a common side effect of many pain medications. You will want to eat something before taking your pain medicine to help prevent nausea.   If you are taking a prescription pain medication that contains acetaminophen, we recommend that you do not take additional over the counter acetaminophen (Tylenol).  Other pain relieving options:   Using a cold pack to ice the affected area a few times a day (15 to 20 minutes at a time) can help to relieve pain, reduce swelling and bruising.   Elevation of the affected area can also help to reduce pain and swelling.  Post Anesthesia Home Care Instructions  Activity: Get plenty of rest for the remainder of the day. A responsible individual must stay with you for 24 hours following the  procedure.  For the next 24 hours, DO NOT: -Drive a car -Advertising copywriter -Drink alcoholic beverages -Take any medication unless instructed by your physician -Make any legal decisions or sign important papers.  Meals: Start with liquid foods such as gelatin or soup. Progress to regular foods as tolerated. Avoid greasy, spicy, heavy foods. If nausea and/or vomiting occur, drink only clear liquids until the nausea and/or vomiting subsides. Call your physician if vomiting continues.  Special Instructions/Symptoms: Your throat may feel dry or sore from the anesthesia or the breathing tube placed in your throat during surgery. If this causes discomfort, gargle with warm salt water. The discomfort should disappear within 24 hours.  If you had a scopolamine patch placed behind your ear for the management of post- operative nausea and/or vomiting:  1. The medication in the patch is effective for 72 hours, after which it should be removed.  Wrap patch in a tissue and discard in the trash. Wash hands thoroughly with soap and water. 2. You may remove the patch earlier than 72 hours if you experience unpleasant side effects which may include dry mouth, dizziness or  visual disturbances. 3. Avoid touching the patch. Wash your hands with soap and water after contact with the patch.    Regional Anesthesia Blocks  1. Numbness or the inability to move the "blocked" extremity may last from 3-48 hours after placement. The length of time depends on the medication injected and your individual response to the medication. If the numbness is not going away after 48 hours, call your surgeon.  2. The extremity that is blocked will need to be protected until the numbness is gone and the  Strength has returned. Because you cannot feel it, you will need to take extra care to avoid injury. Because it may be weak, you may have difficulty moving it or using it. You may not know what position it is in without looking at it  while the block is in effect.  3. For blocks in the legs and feet, returning to weight bearing and walking needs to be done carefully. You will need to wait until the numbness is entirely gone and the strength has returned. You should be able to move your leg and foot normally before you try and bear weight or walk. You will need someone to be with you when you first try to ensure you do not fall and possibly risk injury.  4. Bruising and tenderness at the needle site are common side effects and will resolve in a few days.  5. Persistent numbness or new problems with movement should be communicated to the surgeon or the [facility] [phone] [facility] [phone]).Information for Discharge Teaching: EXPAREL (bupivacaine liposome injectable suspension)   Your surgeon or anesthesiologist gave you EXPAREL(bupivacaine) to help control your pain after surgery.   EXPAREL is a local anesthetic that provides pain relief by numbing the tissue around the surgical site.  EXPAREL is designed to release pain medication over time and can control pain for up to 72 hours.  Depending on how you respond to EXPAREL, you may require less pain medication during your recovery.  Possible side effects:  Temporary loss of sensation or ability to move in the area where bupivacaine was injected.  Nausea, vomiting, constipation  Rarely, numbness and tingling in your mouth or lips, lightheadedness, or anxiety may occur.  Call your doctor right away if you think you may be experiencing any of these sensations, or if you have other questions regarding possible side effects.  Follow all other discharge instructions given to you by your surgeon or nurse. Eat a healthy diet and drink plenty of water or other fluids.  If you return to the hospital for any reason within 96 hours following the administration of EXPAREL, it is important for health care providers to know that you have  received this anesthetic. A teal colored band has been placed on your arm with the date, time and amount of EXPAREL you have received in order to alert and inform your health care providers. Please leave this armband in place for the full 96 hours following administration, and then you may remove the band.

## 2020-01-10 NOTE — Anesthesia Preprocedure Evaluation (Signed)
Anesthesia Evaluation  Patient identified by MRN, date of birth, ID band Patient awake    Reviewed: Allergy & Precautions, NPO status , Patient's Chart, lab work & pertinent test results  Airway Mallampati: I  TM Distance: >3 FB Neck ROM: Full    Dental  (+) Teeth Intact, Dental Advisory Given   Pulmonary former smoker,    breath sounds clear to auscultation       Cardiovascular negative cardio ROS   Rhythm:Regular Rate:Normal     Neuro/Psych  Neuromuscular disease negative psych ROS   GI/Hepatic negative GI ROS, Neg liver ROS,   Endo/Other  negative endocrine ROS  Renal/GU negative Renal ROS  negative genitourinary   Musculoskeletal  (+) Arthritis , Osteoarthritis,    Abdominal   Peds negative pediatric ROS (+)  Hematology negative hematology ROS (+)   Anesthesia Other Findings   Reproductive/Obstetrics negative OB ROS                             Anesthesia Physical  Anesthesia Plan  ASA: II  Anesthesia Plan: General   Post-op Pain Management:  Regional for Post-op pain   Induction: Intravenous  PONV Risk Score and Plan: 2 and Ondansetron, Midazolam and Treatment may vary due to age or medical condition  Airway Management Planned: LMA  Additional Equipment:   Intra-op Plan:   Post-operative Plan: Extubation in OR  Informed Consent: I have reviewed the patients History and Physical, chart, labs and discussed the procedure including the risks, benefits and alternatives for the proposed anesthesia with the patient or authorized representative who has indicated his/her understanding and acceptance.     Dental advisory given  Plan Discussed with: CRNA  Anesthesia Plan Comments:         Anesthesia Quick Evaluation

## 2020-01-10 NOTE — Transfer of Care (Signed)
Immediate Anesthesia Transfer of Care Note  Patient: Oscar Myers  Procedure(s) Performed: LEFT SHOULDER ARTHROSCOPY WITH EXTENSIVE DEBRIDEMENT, SUBACROMIAL DECOMPRESSION, DISTAL CLAVICLE EXCISION, MANIPULATION UNDER ANESTHESIA (Left Shoulder) LEFT CARPAL TUNNEL RELEASE (Left Hand)  Patient Location: PACU  Anesthesia Type:GA combined with regional for post-op pain  Level of Consciousness: drowsy and patient cooperative  Airway & Oxygen Therapy: Patient Spontanous Breathing and Patient connected to face mask oxygen  Post-op Assessment: Report given to RN and Post -op Vital signs reviewed and stable  Post vital signs: Reviewed and stable  Last Vitals:  Vitals Value Taken Time  BP 133/83 01/10/20 1430  Temp    Pulse 90 01/10/20 1430  Resp 7 01/10/20 1430  SpO2 98 % 01/10/20 1430  Vitals shown include unvalidated device data.  Last Pain:  Vitals:   01/10/20 1114  TempSrc: Oral  PainSc: 5          Complications: No complications documented.

## 2020-01-10 NOTE — H&P (Signed)
PREOPERATIVE H&P  Chief Complaint: left shoulder adhesive capsulitis, left carpal tunnel syndrome  HPI: Oscar Myers is a 64 y.o. male who presents for surgical treatment of left shoulder adhesive capsulitis, left carpal tunnel syndrome.  He denies any changes in medical history.  Past Medical History:  Diagnosis Date  . CTS (carpal tunnel syndrome)   . Dyslipidemia    Past Surgical History:  Procedure Laterality Date  . BACK SURGERY    . CARPAL TUNNEL RELEASE Left 04/30/2016   Procedure: LEFT CARPAL TUNNEL RELEASE ENDOSCOPIC;  Surgeon: Sheral Apley, MD;  Location: [facility];  Service: Orthopedics;  Laterality: Left;  . COLONOSCOPY  2012   Social History   Socioeconomic History  . Marital status: Married    Spouse name: Not on file  . Number of children: Not on file  . Years of education: Not on file  . Highest education level: Not on file  Occupational History  . Not on file  Tobacco Use  . Smoking status: Former Smoker    Packs/day: 0.50    Years: 25.00    Pack years: 12.50    Types: Cigarettes  . Smokeless tobacco: Never Used  . Tobacco comment: as of 5 9 2012  Substance and Sexual Activity  . Alcohol use: Yes    Comment: socially- 5 per week  . Drug use: No  . Sexual activity: Yes  Other Topics Concern  . Not on file  Social History Narrative  . Not on file   Social Determinants of Health   Financial Resource Strain:   . Difficulty of Paying Living Expenses: Not on file  Food Insecurity:   . Worried About Programme researcher, broadcasting/film/video in the Last Year: Not on file  . Ran Out of Food in the Last Year: Not on file  Transportation Needs:   . Lack of Transportation (Medical): Not on file  . Lack of Transportation (Non-Medical): Not on file  Physical Activity:   . Days of Exercise per Week: Not on file  . Minutes of Exercise per Session: Not on file  Stress:   . Feeling of Stress : Not on file  Social Connections:   . Frequency of  Communication with Friends and Family: Not on file  . Frequency of Social Gatherings with Friends and Family: Not on file  . Attends Religious Services: Not on file  . Active Member of Clubs or Organizations: Not on file  . Attends Banker Meetings: Not on file  . Marital Status: Not on file   History reviewed. No pertinent family history. No Known Allergies Prior to Admission medications   Medication Sig Start Date End Date Taking? Authorizing Provider  atorvastatin (LIPITOR) 20 MG tablet Take by mouth.    Yes [provider]  buPROPion (WELLBUTRIN SR) 150 MG 12 hr tablet Take 150 mg by mouth 2 (two) times daily.   Yes [provider]  omeprazole (PRILOSEC) 40 MG capsule Take 40 mg by mouth daily.   Yes [provider]  ondansetron (ZOFRAN) 4 MG tablet Take 1 tablet (4 mg total) by mouth every 8 (eight) hours as needed for nausea or vomiting. 01/03/20   Cristie Hem, PA-C  oxyCODONE-acetaminophen (PERCOCET) 5-325 MG tablet Take 1-2 tablets by mouth every 8 (eight) hours as needed for severe pain. 01/03/20   Cristie Hem, PA-C     Positive ROS: All other systems have been reviewed and were otherwise negative with the  exception of those mentioned in the HPI and as above.  Physical Exam: General: Alert, no acute distress Cardiovascular: No pedal edema Respiratory: No cyanosis, no use of accessory musculature GI: abdomen soft Skin: No lesions in the area of chief complaint Neurologic: Sensation intact distally Psychiatric: Patient is competent for consent with normal mood and affect Lymphatic: no lymphedema  MUSCULOSKELETAL: exam stable  Assessment: left shoulder adhesive capsulitis, left carpal tunnel syndrome  Plan: Plan for Procedure(s): LEFT SHOULDER ARTHROSCOPY WITH EXTENSIVE DEBRIDEMENT, SUBACROMIAL DECOMPRESSION, DISTAL CLAVICLE EXCISION, POSSIBLE ROTATOR CUFF REPAIR, MANIPULATION UNDER ANESTHESIA LEFT CARPAL TUNNEL RELEASE  The  risks benefits and alternatives were discussed with the patient including but not limited to the risks of nonoperative treatment, versus surgical intervention including infection, bleeding, nerve injury,  blood clots, cardiopulmonary complications, morbidity, mortality, among others, and they were willing to proceed.   Preoperative templating of the joint replacement has been completed, documented, and submitted to the Operating Room personnel in order to optimize intra-operative equipment management.   Glee Arvin, MD 01/10/2020 7:46 AM

## 2020-01-10 NOTE — Anesthesia Procedure Notes (Signed)
Anesthesia Regional Block: Interscalene brachial plexus block   Pre-Anesthetic Checklist: ,, timeout performed, Correct Patient, Correct Site, Correct Laterality, Correct Procedure, Correct Position, site marked, Risks and benefits discussed,  Surgical consent,  Pre-op evaluation,  At surgeon's request and post-op pain management  Laterality: Left  Prep: chloraprep       Needles:  Injection technique: Single-shot  Needle Type: Stimiplex     Needle Length: 9cm  Needle Gauge: 21     Additional Needles:   Procedures:,,,, ultrasound used (permanent image in chart),,,,  Narrative:  Start time: 01/10/2020 11:34 AM End time: 01/10/2020 11:39 AM Injection made incrementally with aspirations every 5 mL.  Performed by: Personally  Anesthesiologist: Lowella Curb, MD

## 2020-01-10 NOTE — Op Note (Signed)
Date of Surgery: 01/10/2020  INDICATIONS: The patient is chronic left shoulder pain that has failed conservative treatment;  The patient did consent to the procedure after discussion of the risks and benefits.  PREOPERATIVE DIAGNOSIS:  1.  Left shoulder adhesive capsulitis 2.  Left shoulder glenohumeral osteoarthritis 3.  Left shoulder AC joint arthritis 4.  Left shoulder impingement syndrome 5.  Left shoulder tendinopathy of proximal biceps, subscapularis, supraspinatus, infraspinatus 6.  Left shoulder labral degenerative tear 7.  Left carpal tunnel syndrome  POSTOPERATIVE DIAGNOSIS: Same.  PROCEDURE:  1.  Examination and manipulation of left shoulder joint under general anesthesia 2.  Left shoulder arthroscopic distal clavicle excision 3.  Left shoulder arthroscopic extensive debridement of glenoid labrum, proximal biceps, subscapularis tendon, infraspinatus tendon, supraspinatus tendon 4.  Left shoulder lysis of adhesions including limited release of anterior and posterior capsule 5.  Left shoulder subacromial decompression with acromioplasty and CA ligament release 6.  Left carpal tunnel release  SURGEON: N. Glee Arvin, M.D.  ASSIST: Starlyn Skeans [place], [place]; necessary for the timely completion of procedure and due to complexity of procedure..  ANESTHESIA:  general, regional  IV FLUIDS AND URINE: See anesthesia.  ESTIMATED BLOOD LOSS: minimal mL.  IMPLANTS: None  COMPLICATIONS: None.  DESCRIPTION OF PROCEDURE: The patient was brought to the operating room and placed supine on the operating table.  The patient had been signed prior to the procedure and this was documented. The patient had the anesthesia placed by the anesthesiologist.  A time-out was performed to confirm that this was the correct patient, site, side and location. The patient did receive antibiotics prior to the incision and was re-dosed during the procedure as needed at indicated intervals.  While the  patient was supine and examination of the left shoulder was first performed which revealed forward flexion to 160 degrees, external rotation to 55 degrees, internal rotation to L3.  The shoulder joint was then gently manipulated in order to improve the forward flexion to 180 degrees, external rotation to 75 degrees.  I was not able to really improve the internal rotation.  I did not really feel any breakage of adhesions or scar tissue during the manipulation portion.  The patient was then positioned into the beach chair position with all bony prominences well padded and neutral C spine. The patient had the operative extremity prepped and draped in the standard surgical fashion.    We then made incisions for standard shoulder arthroscopy portals.  A diagnostic shoulder arthroscopy was first performed which showed severe tendinopathy of the proximal biceps as well as the superior half of the supraspinatus tendon.  He also had focal areas of grade 4 changes of the glenoid.  He has circumferential degenerative tears of the labrum.  All of the these structures were debrided back to a stable border with an oscillating shaver.  I then did a limited release of the anterior and posterior capsule using an ArthroCare wand but I was not overly aggressive given the fact that his range of motion was not that restricted and the fact that he has a remote history of shoulder instability.  He had a mild amount of synovitis and inflammation but this is not overly impressive compared to the averag patient with adhesive capsulitis.  We then continued our debridement of the articular surface of the supraspinatus and infraspinatus back to stable border.  We could not find any evidence of a full-thickness tear.  I then reposition the arthroscope into the subacromial space.  Subdeltoid and subacromial bursectomy was performed.  The bursal surface of the supraspinatus and infraspinatus were again debrided back to stable border.  No evidence  of a full-thickness tear.  Distal clavicle was then identified and a distal clavicle excision was performed with a high-speed bur.  Subacromial decompression with acromioplasty and CA ligament release was also then performed.  Excess fluid was drained from the shoulder joint.  Incisions were closed with interrupted nylon sutures.  We then placed a sterile tourniquet on the upper forearm and the extremity was exsanguinated using Esmarch.  The tourniquet was inflated to 250 mmHg.  A longitudinal incision was made approximately 5 mm ulnar to the thenar crease.  Dissection was carried down through the subcutaneous tissue retractors were placed the palmar fascia was incised in line with the incision.  Mosquito was placed just deep to the transverse carpal ligament and this was sharply incised under direct visualization using a Beaver blade.  Subcutaneous tunnel was then created for sole retractor and the wrist was placed in flexion and the rest of the transverse carpal ligament as well as the antebrachial fascia was released under direct visualization using tenotomy scissors.  Surgical wound was then thoroughly irrigated.  Nylon sutures were placed.  Sterile dressings were applied for all of the surgical sites.  Shoulder sling was placed.  Patient tolerated procedure well had no immediate complications.  POSTOPERATIVE PLAN: Discharge home.  Follow-up in 1 week for wound recheck and suture removal from the shoulder.  Mayra Reel, MD [facility] [phone] 2:09 PM

## 2020-01-10 NOTE — Progress Notes (Signed)
Assisted Dr. Miller with left, ultrasound guided, interscalene  block. Side rails up, monitors on throughout procedure. See vital signs in flow sheet. Tolerated Procedure well.  

## 2020-01-10 NOTE — Anesthesia Postprocedure Evaluation (Signed)
Anesthesia Post Note  Patient: Oscar Myers  Procedure(s) Performed: LEFT SHOULDER ARTHROSCOPY WITH EXTENSIVE DEBRIDEMENT, SUBACROMIAL DECOMPRESSION, DISTAL CLAVICLE EXCISION, MANIPULATION UNDER ANESTHESIA (Left Shoulder) LEFT CARPAL TUNNEL RELEASE (Left Hand)     Patient location during evaluation: PACU Anesthesia Type: General Level of consciousness: awake and alert Pain management: pain level controlled Vital Signs Assessment: post-procedure vital signs reviewed and stable Respiratory status: spontaneous breathing, nonlabored ventilation, respiratory function stable and patient connected to nasal cannula oxygen Cardiovascular status: blood pressure returned to baseline and stable Postop Assessment: no apparent nausea or vomiting Anesthetic complications: no   No complications documented.  Last Vitals:  Vitals:   01/10/20 1500 01/10/20 1513  BP: (!) 152/90 (!) (P) 148/80  Pulse: 96 (P) 94  Resp: 12 (P) 16  Temp:  (P) 36.6 C  SpO2: 98% (P) 98%    Last Pain:  Vitals:   01/10/20 1513  TempSrc:   PainSc: (P) 0-No pain                 Kodie Pick

## 2020-01-10 NOTE — Anesthesia Procedure Notes (Signed)
Procedure Name: LMA Insertion Date/Time: 01/10/2020 12:47 PM Performed by: Pearson Grippe, CRNA Pre-anesthesia Checklist: Patient identified, Emergency Drugs available, Suction available and Patient being monitored Patient Re-evaluated:Patient Re-evaluated prior to induction Oxygen Delivery Method: Circle system utilized Preoxygenation: Pre-oxygenation with 100% oxygen Induction Type: IV induction Ventilation: Mask ventilation without difficulty LMA: LMA inserted LMA Size: 4.0 Number of attempts: 1 Airway Equipment and Method: Bite block Placement Confirmation: positive ETCO2 Tube secured with: Tape Dental Injury: Teeth and Oropharynx as per pre-operative assessment

## 2020-01-11 ENCOUNTER — Encounter (HOSPITAL_BASED_OUTPATIENT_CLINIC_OR_DEPARTMENT_OTHER): Payer: Self-pay | Admitting: Orthopaedic Surgery

## 2020-01-17 ENCOUNTER — Ambulatory Visit (INDEPENDENT_AMBULATORY_CARE_PROVIDER_SITE_OTHER): Payer: No Typology Code available for payment source | Admitting: Physician Assistant

## 2020-01-17 ENCOUNTER — Encounter: Payer: Self-pay | Admitting: Orthopaedic Surgery

## 2020-01-17 DIAGNOSIS — Z9889 Other specified postprocedural states: Secondary | ICD-10-CM

## 2020-01-17 DIAGNOSIS — G5602 Carpal tunnel syndrome, left upper limb: Secondary | ICD-10-CM

## 2020-01-17 NOTE — Progress Notes (Signed)
Post-Op Visit Note   Patient: Oscar Myers           Date of Birth: [birth year]           MRN: [id number] Visit Date: 01/17/2020 PCP: Ronnald Nian, MD   Assessment & Plan:  Chief Complaint:  Chief Complaint  Patient presents with  . Left Shoulder - Pain  . Left Hand - Pain   Visit Diagnoses:  1. S/P arthroscopy of left shoulder   2. Left carpal tunnel syndrome   3. S/P carpal tunnel release     Plan: Patient is a pleasant 64 year old gentleman who comes in today 1 week out left shoulder arthroscopic debridement, decompression and biceps tenotomy as well as left carpal tunnel release date of surgery 01/10/2020.  He has been doing fairly well.  He still has some pain which is relieved with oxycodone.  No fevers or chills.  Examination of his left shoulder reveals well-healed surgical portals with nylon sutures in place.  No evidence of infection or cellulitis.  Left hand exam shows a well-healing surgical incision with nylon sutures in place.  No evidence of infection or cellulitis.  Fingers are warm and well-perfused.  In regards to the shoulder, sutures were removed and Steri-Strips applied.  Intraoperative pictures reviewed.  I have put in an internal referral for physical therapy for acromioplasty protocol.  He will follow-up with [place] in 5 weeks time for his left shoulder.  In regards to his left hand, it is too early to remove the sutures.  His wound was covered and he was provided with a removable wrist splint.  He will return next week for suture removal.  Call with concerns or questions meantime.  Follow-Up Instructions: Return in about 1 week (around 01/24/2020).   Orders:  Orders Placed This Encounter  Procedures  . Ambulatory referral to Physical Therapy   No orders of the defined types were placed in this encounter.   Imaging: No new imaging  PMFS History: Patient Active Problem List   Diagnosis Date Noted  . Left carpal tunnel syndrome 12/27/2019  . Adhesive  capsulitis of left shoulder 12/27/2019  . Tendinopathy of left biceps 12/27/2019  . Hyperlipidemia with target LDL less than 100 06/23/2012  . Family history of heart disease in male family member before age 47 06/23/2012  . Current smoker 06/23/2012   Past Medical History:  Diagnosis Date  . CTS (carpal tunnel syndrome)   . Dyslipidemia     History reviewed. No pertinent family history.  Past Surgical History:  Procedure Laterality Date  . BACK SURGERY    . CARPAL TUNNEL RELEASE Left 04/30/2016   Procedure: LEFT CARPAL TUNNEL RELEASE ENDOSCOPIC;  Surgeon: Sheral Apley, MD;  Location: [facility];  Service: Orthopedics;  Laterality: Left;  . CARPAL TUNNEL RELEASE Left 01/10/2020   Procedure: LEFT CARPAL TUNNEL RELEASE;  Surgeon: Tarry Kos, MD;  Location: [facility];  Service: Orthopedics;  Laterality: Left;  . COLONOSCOPY  2012  . SHOULDER ARTHROSCOPY WITH ROTATOR CUFF REPAIR AND SUBACROMIAL DECOMPRESSION Left 01/10/2020   Procedure: LEFT SHOULDER ARTHROSCOPY WITH EXTENSIVE DEBRIDEMENT, SUBACROMIAL DECOMPRESSION, DISTAL CLAVICLE EXCISION, MANIPULATION UNDER ANESTHESIA;  Surgeon: Tarry Kos, MD;  Location: [facility];  Service: Orthopedics;  Laterality: Left;   Social History   Occupational History  . Not on file  Tobacco Use  . Smoking status: Former Smoker    Packs/day: 0.50    Years: 25.00  Pack years: 12.50    Types: Cigarettes  . Smokeless tobacco: Never Used  . Tobacco comment: as of 5 9 2012  Vaping Use  . Vaping Use: Never used  Substance and Sexual Activity  . Alcohol use: Yes    Comment: socially- 5 per week  . Drug use: No  . Sexual activity: Yes

## 2020-01-22 ENCOUNTER — Ambulatory Visit: Payer: No Typology Code available for payment source

## 2020-01-24 ENCOUNTER — Ambulatory Visit (INDEPENDENT_AMBULATORY_CARE_PROVIDER_SITE_OTHER): Payer: No Typology Code available for payment source | Admitting: Orthopaedic Surgery

## 2020-01-24 DIAGNOSIS — Z9889 Other specified postprocedural states: Secondary | ICD-10-CM

## 2020-01-24 NOTE — Progress Notes (Signed)
Post-Op Visit Note   Patient: Oscar Myers           Date of Birth: [birth year]           MRN: [id number] Visit Date: 01/24/2020 PCP: Ronnald Nian, MD   Assessment & Plan:  Chief Complaint:  Chief Complaint  Patient presents with  . Left Shoulder - Pain, Routine Post Op   Visit Diagnoses:  1. S/P carpal tunnel release   2. S/P arthroscopy of left shoulder     Plan: Patient is a pleasant 64 year old gentleman who comes in today 2 weeks out left shoulder arthroscopic debridement, decompression and biceps tenotomy as well as left carpal tunnel release date of surgery 01/10/2020.  He has been doing well.  His pain has dramatically improved.  Examination of his left wrist reveals a well-healing surgical incision with nylon sutures in place.  No evidence of infection or cellulitis.  At this point, he may discontinue his sling.  The physical therapy prescription was sent in last week is now being sent through the [facility] for approval.  He is asked for one more prescription of Norco which I have agreed to provide.  We will remove his carpal tunnel sutures today and apply Steri-Strips.  No heavy lifting or submerging his hand in water for another 2 weeks.  He will follow up with [place] in 4 weeks time for repeat evaluation of the left shoulder and left carpal tunnel syndrome.  He will call with concerns or questions in the meantime.  Follow-Up Instructions: Return in about 4 weeks (around 02/21/2020).   Orders:  No orders of the defined types were placed in this encounter.  No orders of the defined types were placed in this encounter.   Imaging: No new imaging  PMFS History: Patient Active Problem List   Diagnosis Date Noted  . Left carpal tunnel syndrome 12/27/2019  . Adhesive capsulitis of left shoulder 12/27/2019  . Tendinopathy of left biceps 12/27/2019  . Hyperlipidemia with target LDL less than 100 06/23/2012  . Family history of heart disease in male family member before age 91  06/23/2012  . Current smoker 06/23/2012   Past Medical History:  Diagnosis Date  . CTS (carpal tunnel syndrome)   . Dyslipidemia     No family history on file.  Past Surgical History:  Procedure Laterality Date  . BACK SURGERY    . CARPAL TUNNEL RELEASE Left 04/30/2016   Procedure: LEFT CARPAL TUNNEL RELEASE ENDOSCOPIC;  Surgeon: Sheral Apley, MD;  Location: [facility];  Service: Orthopedics;  Laterality: Left;  . CARPAL TUNNEL RELEASE Left 01/10/2020   Procedure: LEFT CARPAL TUNNEL RELEASE;  Surgeon: Tarry Kos, MD;  Location: [facility];  Service: Orthopedics;  Laterality: Left;  . COLONOSCOPY  2012  . SHOULDER ARTHROSCOPY WITH ROTATOR CUFF REPAIR AND SUBACROMIAL DECOMPRESSION Left 01/10/2020   Procedure: LEFT SHOULDER ARTHROSCOPY WITH EXTENSIVE DEBRIDEMENT, SUBACROMIAL DECOMPRESSION, DISTAL CLAVICLE EXCISION, MANIPULATION UNDER ANESTHESIA;  Surgeon: Tarry Kos, MD;  Location: [facility];  Service: Orthopedics;  Laterality: Left;   Social History   Occupational History  . Not on file  Tobacco Use  . Smoking status: Former Smoker    Packs/day: 0.50    Years: 25.00    Pack years: 12.50    Types: Cigarettes  . Smokeless tobacco: Never Used  . Tobacco comment: as of 5 9 2012  Vaping Use  . Vaping Use: Never used  Substance and Sexual  Activity  . Alcohol use: Yes    Comment: socially- 5 per week  . Drug use: No  . Sexual activity: Yes

## 2020-02-01 ENCOUNTER — Other Ambulatory Visit: Payer: Self-pay

## 2020-02-01 ENCOUNTER — Encounter: Payer: Self-pay | Admitting: Physical Therapy

## 2020-02-01 ENCOUNTER — Ambulatory Visit: Payer: No Typology Code available for payment source | Attending: Physician Assistant | Admitting: Physical Therapy

## 2020-02-01 DIAGNOSIS — M25512 Pain in left shoulder: Secondary | ICD-10-CM | POA: Diagnosis present

## 2020-02-01 DIAGNOSIS — M75112 Incomplete rotator cuff tear or rupture of left shoulder, not specified as traumatic: Secondary | ICD-10-CM | POA: Insufficient documentation

## 2020-02-01 DIAGNOSIS — G8929 Other chronic pain: Secondary | ICD-10-CM

## 2020-02-01 DIAGNOSIS — M25612 Stiffness of left shoulder, not elsewhere classified: Secondary | ICD-10-CM

## 2020-02-01 DIAGNOSIS — M6281 Muscle weakness (generalized): Secondary | ICD-10-CM | POA: Diagnosis present

## 2020-02-01 NOTE — Therapy (Signed)
[facility] [address] [place], [place], [address] Phone: [phone]   Fax:  [phone]  Physical Therapy Evaluation  Patient Details  Name: Oscar Myers MRN: [id number] Date of Birth: December 22, 1955 Referring Provider (PT): Jari Sportsman, PA-C (surgery by Gershon Mussel, MD)   Encounter Date: 02/01/2020   PT End of Session - 02/01/20 1526    Visit Number 1    Number of Visits 15    Date for PT Re-Evaluation 03/28/20    Authorization Type [place]    Authorization Time Period 11/03/19-05/01/20    Authorization - Visit Number 1    Authorization - Number of Visits 15    PT Start Time 1417    PT Stop Time 1507    PT Time Calculation (min) 50 min    Activity Tolerance Patient limited by pain    Behavior During Therapy [facility] for tasks assessed/performed           Past Medical History:  Diagnosis Date  . CTS (carpal tunnel syndrome)   . Dyslipidemia     Past Surgical History:  Procedure Laterality Date  . BACK SURGERY    . CARPAL TUNNEL RELEASE Left 04/30/2016   Procedure: LEFT CARPAL TUNNEL RELEASE ENDOSCOPIC;  Surgeon: Sheral Apley, MD;  Location: [facility];  Service: Orthopedics;  Laterality: Left;  . CARPAL TUNNEL RELEASE Left 01/10/2020   Procedure: LEFT CARPAL TUNNEL RELEASE;  Surgeon: Tarry Kos, MD;  Location: [facility];  Service: Orthopedics;  Laterality: Left;  . COLONOSCOPY  2012  . SHOULDER ARTHROSCOPY WITH ROTATOR CUFF REPAIR AND SUBACROMIAL DECOMPRESSION Left 01/10/2020   Procedure: LEFT SHOULDER ARTHROSCOPY WITH EXTENSIVE DEBRIDEMENT, SUBACROMIAL DECOMPRESSION, DISTAL CLAVICLE EXCISION, MANIPULATION UNDER ANESTHESIA;  Surgeon: Tarry Kos, MD;  Location: [facility];  Service: Orthopedics;  Laterality: Left;    There were no vitals filed for this visit.    Subjective Assessment - 02/01/20 1424    Subjective Pt. is a 64 y/o male referred to PT s/p left shoulder  surgery on 01/10/20 for SAD, DCE, debridement, biceps tenotomy and manipulation under anesthesia also with left carpal tunnel release. He reports multi-year history (x at least 3 years) of issues with chronic left shoulder pain and stiffness prior to surgery. He does have a remote history of a previous left shoulder surgery in the late 1970s while in the [employer]. Pt. is out of sling and cleared for PT to work on therapy per protocol (acromioplasty protocol).    Pertinent History remote history of prevoius left shoulder surgery in the late 1970s, pt. also has right shoulder pain issues as well    Limitations House hold activities;Lifting    Diagnostic tests X-rays    Patient Stated Goals Get shoulder pain, return to work    Currently in Pain? Yes    Pain Score 7     Pain Location Shoulder    Pain Orientation Left    Pain Descriptors / Indicators Dull;Sharp    Pain Type Surgical pain    Pain Onset 1 to 4 weeks ago    Pain Frequency Constant    Aggravating Factors  left arm use and movement    Pain Relieving Factors ice, medication, rest    Effect of Pain on Daily Activities limits ability for reaching ADLs              [facility] PT Assessment - 02/01/20 0001      Assessment   Medical Diagnosis  s/p left shoulder arthroscopic, SAD, DCE, debridement of rotator cuff and labrum, biceps tenotomy, manipulation under anesthesia, also had left carpal tunnel release along with left shoulder surgery    Referring Provider (PT) Jari SportsmanMary Stanbery, PA-C   surgery by Gershon MusselNaiping Xu, MD   Onset Date/Surgical Date 01/10/20    Hand Dominance Right    Next MD Visit 02/21/2020      Precautions   Precautions None    Precaution Comments progress per acromiosplasty protocol      Restrictions   Weight Bearing Restrictions No      Balance Screen   Has the patient fallen in the past 6 months No      Prior Function   Level of Independence Independent with basic ADLs    Vocation Requirements previously worked  Designer, 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., has not worked since May of this year      Cognition   Overall Cognitive Status Within Functional Limits for tasks assessed      Observation/Other Assessments   Observations left shoulder and wrist surgical incision sites well-healed with no signs of infection    Focus on Therapeutic Outcomes (FOTO)  60% limited      Posture/Postural Control   Posture Comments mild rounding of bilateral shoulders with left shoulder elevated slightly>right      ROM / Strength   AROM / PROM / Strength AROM;PROM;Strength      AROM   AROM Assessment Site Shoulder    Right/Left Shoulder Right;Left    Right Shoulder Flexion 105 Degrees    Right Shoulder ABduction 80 Degrees    Right Shoulder Internal Rotation --   reach to sacrum   Right Shoulder External Rotation --   reach to right upper trapezius region   Left Shoulder Flexion 40 Degrees    Left Shoulder Internal Rotation 30 Degrees    Left Shoulder External Rotation 12 Degrees      PROM   Overall PROM Comments Left shoulder PROM limited by pain and muscle guarding    PROM Assessment Site Shoulder    Right/Left Shoulder Left    Left Shoulder Flexion 30 Degrees   limited by muscle guarding and pain   Left Shoulder ABduction 30 Degrees    Left Shoulder Internal Rotation 30 Degrees    Left Shoulder External Rotation 25 Degrees      Strength   Overall Strength Comments Left side MMTs not tested given recent post-op status and limited ROM    Strength Assessment Site Shoulder;Elbow    Right/Left Shoulder Right;Left    Right Shoulder Flexion 4+/5    Right Shoulder ABduction 4+/5    Right Shoulder Internal Rotation 5/5    Right Shoulder External Rotation 5/5    Right/Left Elbow --    Right Elbow Flexion 5/5    Right Elbow Extension 5/5                      Objective measurements completed on examination: See above findings.       [facility] Adult PT  Treatment/Exercise - 02/01/20 0001      Exercises   Exercises Shoulder      Shoulder Exercises: Supine   External Rotation Limitations supine wand ER x 10 reps      Shoulder Exercises: Seated   Other Seated Exercises left elbow flexion AROM x 10 rpes, left wrist flexion/extension AROM x 10 reps      Shoulder Exercises: Standing  Other Standing Exercises left shoulder pendulums with HEP instruction and practice x 20 reps, scapular retraction x 10 reps      Shoulder Exercises: Stretch   Table Stretch -Flexion Limitations 10 reps left shoulder      Modalities   Modalities --   pt. declined cryo                 PT Education - 02/01/20 1526    Education Details HEP, POC, shoulder anatomy with review surgical procedure performed    Person(s) Educated Patient    Methods Explanation;Demonstration;Verbal cues;Handout    Comprehension Returned demonstration;Verbalized understanding            PT Short Term Goals - 02/01/20 1535      PT SHORT TERM GOAL #1   Title Independent with initial HEP    Baseline instructed at eval    Time 4    Period Weeks    Status New      PT SHORT TERM GOAL #2   Title Increase left shoulder AROM to at least 90 deg or greater to work towards improved reaching ability for ADLs such as grooming    Baseline 40 deg AROM, 30 deg PROM limited by pain and muscle guarding    Time 4    Period Weeks    Status New             PT Long Term Goals - 02/01/20 1536      PT LONG TERM GOAL #1   Title Improve FOTO outcome measure score to 37% or less impairment    Baseline 60% limited    Time 8    Period Weeks    Status New    Target Date 03/28/20      PT LONG TERM GOAL #2   Title Left shoulder AROM grossly WFL to dress and bathe with left UE use    Baseline left shoulder ROM grossly limited    Time 8    Period Weeks    Status New    Target Date 03/28/20      PT LONG TERM GOAL #3   Title Left shoulder strength grossly 5/5 to improve  ability for lifting activities and to assist return to work pending MD clearance    Baseline MMTs not tested at eval    Time 8    Period Weeks    Status New    Target Date 03/28/20      PT LONG TERM GOAL #4   Title Perform reaching ADLs including dressing, bathing, chores as needed with left shoulder pain 3/10 or less    Baseline 7/10    Time 8    Period Weeks    Status New    Target Date 03/28/20                  Plan - 02/01/20 1528    Clinical Impression Statement Pt. presents with left shoulder pain and stiffness/decreased ROM and muscle weakness s/p left shoulder surgery. He has associated functional limitations for difficulty performing reaching ADLs and is unable to perform lifting activities for chores and work. Pt. would benefit from PT to address current limitations to improve functional stauts for left shoulder use for ADLs/IADLs and work.    Personal Factors and Comorbidities Other   right shoulder pain issues   Examination-Activity Limitations Lift;Hygiene/Grooming;Bathing;Dressing;Sleep;Bed Mobility;Carry;Reach Overhead    Examination-Participation Restrictions Driving;Meal Prep;Laundry;Cleaning;Occupation    Stability/Clinical Decision Making Stable/Uncomplicated    Clinical Decision Making Low  Rehab Potential Good    PT Frequency --   2-3x/week   PT Duration 8 weeks    PT Treatment/Interventions ADLs/Self Care Home Management;Cryotherapy;Electrical Stimulation;Moist Heat;Therapeutic activities;Therapeutic exercise;Patient/family education;Manual techniques;Passive range of motion;Dry needling;Neuromuscular re-education;Taping;Vasopneumatic Device    PT Next Visit Plan review HEP as needed, caution with left wrist due to carpal tunnel surgery for gripping for wand/pulleys etc. (no wrist restrictions other than no liting for now) left shoulder PROM/stretching focus, continue/progress AAROM as noted, progress as tolerated per acromioplasty protocol, cryo/vaso prn     PT Home Exercise Plan Access code: [email or web address]    Consulted and Agree with Plan of Care Patient           Patient will benefit from skilled therapeutic intervention in order to improve the following deficits and impairments:  Pain, Impaired UE functional use, Decreased strength, Decreased range of motion, Hypomobility  Visit Diagnosis: Incomplete tear of left rotator cuff, unspecified whether traumatic  Chronic left shoulder pain  Stiffness of left shoulder, not elsewhere classified  Muscle weakness (generalized)     Problem List Patient Active Problem List   Diagnosis Date Noted  . Left carpal tunnel syndrome 12/27/2019  . Adhesive capsulitis of left shoulder 12/27/2019  . Tendinopathy of left biceps 12/27/2019  . Hyperlipidemia with target LDL less than 100 06/23/2012  . Family history of heart disease in male family member before age 56 06/23/2012  . Current smoker 06/23/2012    Lazarus Gowda, PT, DPT 02/01/20 3:39 PM  [facility] [address] [place], [place], [address] Phone: [phone]   Fax:  [phone]  Name: AZARYAH OLEKSY MRN: [id number] Date of Birth: Jul 14, 1955

## 2020-02-02 ENCOUNTER — Ambulatory Visit: Payer: No Typology Code available for payment source | Attending: Physician Assistant | Admitting: Physical Therapy

## 2020-02-02 ENCOUNTER — Encounter: Payer: Self-pay | Admitting: Physical Therapy

## 2020-02-02 DIAGNOSIS — M25612 Stiffness of left shoulder, not elsewhere classified: Secondary | ICD-10-CM | POA: Insufficient documentation

## 2020-02-02 DIAGNOSIS — M6281 Muscle weakness (generalized): Secondary | ICD-10-CM | POA: Diagnosis present

## 2020-02-02 DIAGNOSIS — G8929 Other chronic pain: Secondary | ICD-10-CM | POA: Diagnosis present

## 2020-02-02 DIAGNOSIS — M25512 Pain in left shoulder: Secondary | ICD-10-CM | POA: Diagnosis present

## 2020-02-02 DIAGNOSIS — M75112 Incomplete rotator cuff tear or rupture of left shoulder, not specified as traumatic: Secondary | ICD-10-CM | POA: Insufficient documentation

## 2020-02-02 NOTE — Therapy (Signed)
[facility] [address] [place], [place], [address] Phone: [phone]   Fax:  [phone]  Physical Therapy Treatment  Patient Details  Name: Oscar Myers MRN: [id number] Date of Birth: 30-Oct-1955 Referring Provider (PT): Jari Sportsman, PA-C (surgery by Gershon Mussel, MD)   Encounter Date: 02/02/2020   PT End of Session - 02/02/20 0849    Visit Number 2    Number of Visits 15    Date for PT Re-Evaluation 03/28/20    Authorization Type [facility]    Authorization Time Period 11/03/19-05/01/20    Authorization - Visit Number 2    Authorization - Number of Visits 15    PT Start Time 0848    PT Stop Time 0939    PT Time Calculation (min) 51 min    Activity Tolerance Patient limited by pain    Behavior During Therapy [facility] for tasks assessed/performed           Past Medical History:  Diagnosis Date  . CTS (carpal tunnel syndrome)   . Dyslipidemia     Past Surgical History:  Procedure Laterality Date  . BACK SURGERY    . CARPAL TUNNEL RELEASE Left 04/30/2016   Procedure: LEFT CARPAL TUNNEL RELEASE ENDOSCOPIC;  Surgeon: Sheral Apley, MD;  Location: [facility];  Service: Orthopedics;  Laterality: Left;  . CARPAL TUNNEL RELEASE Left 01/10/2020   Procedure: LEFT CARPAL TUNNEL RELEASE;  Surgeon: Tarry Kos, MD;  Location: [facility];  Service: Orthopedics;  Laterality: Left;  . COLONOSCOPY  2012  . SHOULDER ARTHROSCOPY WITH ROTATOR CUFF REPAIR AND SUBACROMIAL DECOMPRESSION Left 01/10/2020   Procedure: LEFT SHOULDER ARTHROSCOPY WITH EXTENSIVE DEBRIDEMENT, SUBACROMIAL DECOMPRESSION, DISTAL CLAVICLE EXCISION, MANIPULATION UNDER ANESTHESIA;  Surgeon: Tarry Kos, MD;  Location: [facility];  Service: Orthopedics;  Laterality: Left;    There were no vitals filed for this visit.   Subjective Assessment - 02/02/20 0850    Subjective Some soreness after eval but no new complaints/concerns  otherwise since yesterday's visit.    Pertinent History remote history of prevoius left shoulder surgery in the late 1970s, pt. also has right shoulder pain issues as well    Limitations House hold activities;Lifting    Diagnostic tests X-rays    Patient Stated Goals Get shoulder pain, return to work              [facility] PT Assessment - 02/02/20 0001      PROM   Left Shoulder Flexion 95 Degrees    Left Shoulder External Rotation 40 Degrees                         [facility] Adult PT Treatment/Exercise - 02/02/20 0001      Elbow Exercises   Elbow Flexion --   left elbow flexion and wrist flexion/extension AROM x 30 ea.     Shoulder Exercises: Supine   External Rotation Limitations supine wand ER 2 x 10 reps      Shoulder Exercises: Standing   Other Standing Exercises left shoulder pendulums x 2 min and "saw" protraction/retraction 2x10    Other Standing Exercises scapular retraction and shoulder rolls x 20 ea.      Shoulder Exercises: Pulleys   Flexion 3 minutes      Shoulder Exercises: Stretch   Table Stretch -Flexion Limitations 2x10 left shoulder flexion modified with bilat. UE on 65 cm P-ball on low table 2x10  Modalities   Modalities Vasopneumatic      Vasopneumatic   Number Minutes Vasopneumatic  10 minutes    Vasopnuematic Location  Shoulder    Vasopneumatic Pressure Medium    Vasopneumatic Temperature  34      Manual Therapy   Manual Therapy Joint mobilization;Soft tissue mobilization;Passive ROM    Joint Mobilization left GH AP and caudal glides grade I-II    Passive ROM Left shoulder 4-way PROM                    PT Short Term Goals - 02/01/20 1535      PT SHORT TERM GOAL #1   Title Independent with initial HEP    Baseline instructed at eval    Time 4    Period Weeks    Status New      PT SHORT TERM GOAL #2   Title Increase left shoulder AROM to at least 90 deg or greater to work towards improved reaching ability for ADLs  such as grooming    Baseline 40 deg AROM, 30 deg PROM limited by pain and muscle guarding    Time 4    Period Weeks    Status New             PT Long Term Goals - 02/01/20 1536      PT LONG TERM GOAL #1   Title Improve FOTO outcome measure score to 37% or less impairment    Baseline 60% limited    Time 8    Period Weeks    Status New    Target Date 03/28/20      PT LONG TERM GOAL #2   Title Left shoulder AROM grossly WFL to dress and bathe with left UE use    Baseline left shoulder ROM grossly limited    Time 8    Period Weeks    Status New    Target Date 03/28/20      PT LONG TERM GOAL #3   Title Left shoulder strength grossly 5/5 to improve ability for lifting activities and to assist return to work pending MD clearance    Baseline MMTs not tested at eval    Time 8    Period Weeks    Status New    Target Date 03/28/20      PT LONG TERM GOAL #4   Title Perform reaching ADLs including dressing, bathing, chores as needed with left shoulder pain 3/10 or less    Baseline 7/10    Time 8    Period Weeks    Status New    Target Date 03/28/20                 Plan - 02/02/20 0932    Clinical Impression Statement Still with significant stiffness in shoulder but notable improvement with decreased tightness and tendnecy for muscle guarding from baselin at eval yesterday. Progress for therapy goals with be ongoing with need for continued PT to immprove functional use of left shoulder for reaching ADLs.    Personal Factors and Comorbidities Other    Examination-Activity Limitations Lift;Hygiene/Grooming;Bathing;Dressing;Sleep;Bed Mobility;Carry;Reach Overhead    Examination-Participation Restrictions Driving;Meal Prep;Laundry;Cleaning;Occupation    Stability/Clinical Decision Making Stable/Uncomplicated    Clinical Decision Making Low    Rehab Potential Good    PT Frequency --   2-3x/week   PT Treatment/Interventions ADLs/Self Care Home  Management;Cryotherapy;Electrical Stimulation;Moist Heat;Therapeutic activities;Therapeutic exercise;Patient/family education;Manual techniques;Passive range of motion;Dry needling;Neuromuscular re-education;Taping;Vasopneumatic Device    PT Next  Visit Plan caution with left wrist due to carpal tunnel surgery for gripping for wand/pulleys etc. (no wrist restrictions other than no liting for now) left shoulder PROM/stretching focus, continue/progress AAROM as noted, progress as tolerated per acromioplasty protocol, cryo/vaso prn    PT Home Exercise Plan Access code: [email or web address]    Consulted and Agree with Plan of Care Patient           Patient will benefit from skilled therapeutic intervention in order to improve the following deficits and impairments:  Pain, Impaired UE functional use, Decreased strength, Decreased range of motion, Hypomobility  Visit Diagnosis: Incomplete tear of left rotator cuff, unspecified whether traumatic  Chronic left shoulder pain  Stiffness of left shoulder, not elsewhere classified  Muscle weakness (generalized)     Problem List Patient Active Problem List   Diagnosis Date Noted  . Left carpal tunnel syndrome 12/27/2019  . Adhesive capsulitis of left shoulder 12/27/2019  . Tendinopathy of left biceps 12/27/2019  . Hyperlipidemia with target LDL less than 100 06/23/2012  . Family history of heart disease in male family member before age 72 06/23/2012  . Current smoker 06/23/2012    Lazarus Gowda, PT, DPT 02/02/20 9:41 AM  [facility] [address] [place], [place], [address] Phone: [phone]   Fax:  [phone]  Name: Oscar Myers MRN: [id number] Date of Birth: Apr 13, 1956

## 2020-02-06 ENCOUNTER — Other Ambulatory Visit: Payer: Self-pay

## 2020-02-06 ENCOUNTER — Ambulatory Visit: Payer: No Typology Code available for payment source | Admitting: Physical Therapy

## 2020-02-06 ENCOUNTER — Encounter: Payer: Self-pay | Admitting: Physical Therapy

## 2020-02-06 DIAGNOSIS — M25512 Pain in left shoulder: Secondary | ICD-10-CM

## 2020-02-06 DIAGNOSIS — M75112 Incomplete rotator cuff tear or rupture of left shoulder, not specified as traumatic: Secondary | ICD-10-CM | POA: Diagnosis not present

## 2020-02-06 DIAGNOSIS — M6281 Muscle weakness (generalized): Secondary | ICD-10-CM

## 2020-02-06 DIAGNOSIS — G8929 Other chronic pain: Secondary | ICD-10-CM

## 2020-02-06 DIAGNOSIS — M25612 Stiffness of left shoulder, not elsewhere classified: Secondary | ICD-10-CM

## 2020-02-07 NOTE — Therapy (Signed)
[facility] Outpatient Rehabilitation [facility] [address] [place], [place], [address] Phone: [phone]   Fax:  [phone]  Physical Therapy Treatment  Patient Details  Name: Oscar Myers MRN: [id number] Date of Birth: 10-Apr-1956 Referring Provider (PT): Jari Sportsman, PA-C (surgery by Gershon Mussel, MD)   Encounter Date: 02/06/2020   PT End of Session - 02/07/20 0810    Visit Number 3    Number of Visits 15    Date for PT Re-Evaluation 03/28/20    Authorization Type [facility]    Authorization Time Period 11/03/19-05/01/20    PT Start Time 1015    PT Stop Time 1058    PT Time Calculation (min) 43 min    Activity Tolerance Patient limited by pain    Behavior During Therapy [facility] for tasks assessed/performed           Past Medical History:  Diagnosis Date  . CTS (carpal tunnel syndrome)   . Dyslipidemia     Past Surgical History:  Procedure Laterality Date  . BACK SURGERY    . CARPAL TUNNEL RELEASE Left 04/30/2016   Procedure: LEFT CARPAL TUNNEL RELEASE ENDOSCOPIC;  Surgeon: Sheral Apley, MD;  Location: [facility];  Service: Orthopedics;  Laterality: Left;  . CARPAL TUNNEL RELEASE Left 01/10/2020   Procedure: LEFT CARPAL TUNNEL RELEASE;  Surgeon: Tarry Kos, MD;  Location: [facility];  Service: Orthopedics;  Laterality: Left;  . COLONOSCOPY  2012  . SHOULDER ARTHROSCOPY WITH ROTATOR CUFF REPAIR AND SUBACROMIAL DECOMPRESSION Left 01/10/2020   Procedure: LEFT SHOULDER ARTHROSCOPY WITH EXTENSIVE DEBRIDEMENT, SUBACROMIAL DECOMPRESSION, DISTAL CLAVICLE EXCISION, MANIPULATION UNDER ANESTHESIA;  Surgeon: Tarry Kos, MD;  Location: [facility];  Service: Orthopedics;  Laterality: Left;    There were no vitals filed for this visit.   Subjective Assessment - 02/06/20 1016    Subjective Patient reports today his pain is around a 6-7/10. he reports it is sore.    Pertinent History remote history of prevoius left  shoulder surgery in the late 1970s, pt. also has right shoulder pain issues as well    Limitations House hold activities;Lifting    Diagnostic tests X-rays    Patient Stated Goals Get shoulder pain, return to work    Currently in Pain? Yes    Pain Score 6     Pain Location Shoulder    Pain Orientation Left    Pain Type Chronic pain    Pain Onset More than a month ago    Pain Frequency Constant    Aggravating Factors  movement of the left arm    Pain Relieving Factors ice, medication    Effect of Pain on Daily Activities limits ability to reach                             [facility] Adult PT Treatment/Exercise - 02/07/20 0001      Shoulder Exercises: Supine   External Rotation Limitations supine wand ER 2 x 10 reps    Other Supine Exercises wand flexion 2x5 in pain free range       Shoulder Exercises: Standing   Extension Both;20 reps    Theraband Level (Shoulder Extension) Level 1 (Yellow)    Row 20 reps;Both;Theraband    Theraband Level (Shoulder Row) Level 1 (Yellow)      Shoulder Exercises: Pulleys   Flexion 3 minutes      Modalities   Modalities --  declined ice      Manual Therapy   Joint Mobilization left GH AP and caudal glides grade I-II    Passive ROM Left shoulder 4-way PROM                  PT Education - 02/06/20 1017    Education Details updated HEP; given bands for home    Person(s) Educated Patient    Methods Explanation;Demonstration;Tactile cues;Verbal cues    Comprehension Verbalized understanding;Returned demonstration;Tactile cues required;Verbal cues required            PT Short Term Goals - 02/01/20 1535      PT SHORT TERM GOAL #1   Title Independent with initial HEP    Baseline instructed at eval    Time 4    Period Weeks    Status New      PT SHORT TERM GOAL #2   Title Increase left shoulder AROM to at least 90 deg or greater to work towards improved reaching ability for ADLs such as grooming    Baseline 40  deg AROM, 30 deg PROM limited by pain and muscle guarding    Time 4    Period Weeks    Status New             PT Long Term Goals - 02/01/20 1536      PT LONG TERM GOAL #1   Title Improve FOTO outcome measure score to 37% or less impairment    Baseline 60% limited    Time 8    Period Weeks    Status New    Target Date 03/28/20      PT LONG TERM GOAL #2   Title Left shoulder AROM grossly WFL to dress and bathe with left UE use    Baseline left shoulder ROM grossly limited    Time 8    Period Weeks    Status New    Target Date 03/28/20      PT LONG TERM GOAL #3   Title Left shoulder strength grossly 5/5 to improve ability for lifting activities and to assist return to work pending MD clearance    Baseline MMTs not tested at eval    Time 8    Period Weeks    Status New    Target Date 03/28/20      PT LONG TERM GOAL #4   Title Perform reaching ADLs including dressing, bathing, chores as needed with left shoulder pain 3/10 or less    Baseline 7/10    Time 8    Period Weeks    Status New    Target Date 03/28/20                 Plan - 02/06/20 1141    Clinical Impression Statement Patients passive ROM has improved signifcanlty since the last visit. He has been working on his exercises. He was given wand flexion for home. His range may put him past where a table slide is beneficial. He was adivsed to stick with the table slide until that time.Therapy also added light reistance to his scpaular exercises. he had no significant increase in pain. He had the most pain today with abduction. he was not pushed into that range.    Personal Factors and Comorbidities Other    Examination-Activity Limitations Lift;Hygiene/Grooming;Bathing;Dressing;Sleep;Bed Mobility;Carry;Reach Overhead    Examination-Participation Restrictions Driving;Meal Prep;Laundry;Cleaning;Occupation    Stability/Clinical Decision Making Stable/Uncomplicated    Clinical Decision Making Low    Rehab  Potential Good    PT Treatment/Interventions ADLs/Self Care Home Management;Cryotherapy;Electrical Stimulation;Moist Heat;Therapeutic activities;Therapeutic exercise;Patient/family education;Manual techniques;Passive range of motion;Dry needling;Neuromuscular re-education;Taping;Vasopneumatic Device    PT Next Visit Plan caution with left wrist due to carpal tunnel surgery for gripping for wand/pulleys etc. (no wrist restrictions other than no liting for now) left shoulder PROM/stretching focus, continue/progress AAROM as noted, progress as tolerated per acromioplasty protocol, cryo/vaso prn    PT Home Exercise Plan Access code: [email or web address]    Consulted and Agree with Plan of Care Patient           Patient will benefit from skilled therapeutic intervention in order to improve the following deficits and impairments:  Pain, Impaired UE functional use, Decreased strength, Decreased range of motion, Hypomobility  Visit Diagnosis: Incomplete tear of left rotator cuff, unspecified whether traumatic  Chronic left shoulder pain  Stiffness of left shoulder, not elsewhere classified  Muscle weakness (generalized)     Problem List Patient Active Problem List   Diagnosis Date Noted  . Left carpal tunnel syndrome 12/27/2019  . Adhesive capsulitis of left shoulder 12/27/2019  . Tendinopathy of left biceps 12/27/2019  . Hyperlipidemia with target LDL less than 100 06/23/2012  . Family history of heart disease in male family member before age 64 06/23/2012  . Current smoker 06/23/2012    Dessie Coma PT DPT  02/07/2020, 8:11 AM  [facility] [address] [place], [place], [address] Phone: [phone]   Fax:  [phone]  Name: SHIVAM MESTAS MRN: [id number] Date of Birth: 1956-04-14

## 2020-02-09 ENCOUNTER — Other Ambulatory Visit: Payer: Self-pay

## 2020-02-09 ENCOUNTER — Encounter: Payer: Self-pay | Admitting: Physical Therapy

## 2020-02-09 ENCOUNTER — Ambulatory Visit: Payer: No Typology Code available for payment source | Admitting: Physical Therapy

## 2020-02-09 DIAGNOSIS — G8929 Other chronic pain: Secondary | ICD-10-CM

## 2020-02-09 DIAGNOSIS — M75112 Incomplete rotator cuff tear or rupture of left shoulder, not specified as traumatic: Secondary | ICD-10-CM | POA: Diagnosis not present

## 2020-02-09 DIAGNOSIS — M25612 Stiffness of left shoulder, not elsewhere classified: Secondary | ICD-10-CM

## 2020-02-09 DIAGNOSIS — M6281 Muscle weakness (generalized): Secondary | ICD-10-CM

## 2020-02-09 DIAGNOSIS — M25512 Pain in left shoulder: Secondary | ICD-10-CM

## 2020-02-09 NOTE — Therapy (Signed)
[facility] Outpatient Rehabilitation [facility] [address] [place], [place], [address] Phone: [phone]   Fax:  [phone]  Physical Therapy Treatment  Patient Details  Name: Oscar Myers MRN: [id number] Date of Birth: January 18, 1961 Referring Provider (PT): Jari Sportsman, PA-C (surgery by Gershon Mussel, MD)   Encounter Date: 02/09/2020   PT End of Session - 02/09/20 0844    Visit Number 4    Number of Visits 15    Date for PT Re-Evaluation 03/28/20    Authorization Type [facility]    Authorization Time Period 11/03/19-05/01/20    Authorization - Visit Number 3    Authorization - Number of Visits 15    PT Start Time 0848    PT Stop Time 0936    PT Time Calculation (min) 48 min           Past Medical History:  Diagnosis Date  . CTS (carpal tunnel syndrome)   . Dyslipidemia     Past Surgical History:  Procedure Laterality Date  . BACK SURGERY    . CARPAL TUNNEL RELEASE Left 04/30/2016   Procedure: LEFT CARPAL TUNNEL RELEASE ENDOSCOPIC;  Surgeon: Sheral Apley, MD;  Location: [facility];  Service: Orthopedics;  Laterality: Left;  . CARPAL TUNNEL RELEASE Left 01/10/2020   Procedure: LEFT CARPAL TUNNEL RELEASE;  Surgeon: Tarry Kos, MD;  Location: [facility];  Service: Orthopedics;  Laterality: Left;  . COLONOSCOPY  2012  . SHOULDER ARTHROSCOPY WITH ROTATOR CUFF REPAIR AND SUBACROMIAL DECOMPRESSION Left 01/10/2020   Procedure: LEFT SHOULDER ARTHROSCOPY WITH EXTENSIVE DEBRIDEMENT, SUBACROMIAL DECOMPRESSION, DISTAL CLAVICLE EXCISION, MANIPULATION UNDER ANESTHESIA;  Surgeon: Tarry Kos, MD;  Location: [facility];  Service: Orthopedics;  Laterality: Left;    There were no vitals filed for this visit.   Subjective Assessment - 02/09/20 0849    Subjective Shoulder is always sore.    Currently in Pain? Yes    Pain Score 7     Pain Location Shoulder    Pain Orientation Left    Pain Descriptors / Indicators Sore     Pain Type Chronic pain    Aggravating Factors  reaching and lifting    Pain Relieving Factors ice meds              [facility] PT Assessment - 02/09/20 0001      PROM   Left Shoulder Flexion 100 Degrees    Left Shoulder ABduction 75 Degrees    Left Shoulder Internal Rotation 40 Degrees    Left Shoulder External Rotation 40 Degrees                         [facility] Adult PT Treatment/Exercise - 02/09/20 0001      Shoulder Exercises: Standing   Extension Both;20 reps    Theraband Level (Shoulder Extension) Level 1 (Yellow)    Row 20 reps;Both;Theraband    Theraband Level (Shoulder Row) Level 1 (Yellow)    Other Standing Exercises Ue ranger for standing ER AAROM , standing horizonals     Other Standing Exercises UE ranger for flexion reaching       Shoulder Exercises: Pulleys   Flexion 3 minutes      Vasopneumatic   Number Minutes Vasopneumatic  10 minutes    Vasopnuematic Location  Shoulder    Vasopneumatic Pressure Low    Vasopneumatic Temperature  40   2 snow flakes     Manual Therapy   Manual  Therapy Soft tissue mobilization    Soft tissue mobilization left anterior, lateral and posterior shoulder      Passive ROM Left shoulder 4-way PROM                    PT Short Term Goals - 02/01/20 1535      PT SHORT TERM GOAL #1   Title Independent with initial HEP    Baseline instructed at eval    Time 4    Period Weeks    Status New      PT SHORT TERM GOAL #2   Title Increase left shoulder AROM to at least 90 deg or greater to work towards improved reaching ability for ADLs such as grooming    Baseline 40 deg AROM, 30 deg PROM limited by pain and muscle guarding    Time 4    Period Weeks    Status New             PT Long Term Goals - 02/01/20 1536      PT LONG TERM GOAL #1   Title Improve FOTO outcome measure score to 37% or less impairment    Baseline 60% limited    Time 8    Period Weeks    Status New    Target Date 03/28/20       PT LONG TERM GOAL #2   Title Left shoulder AROM grossly WFL to dress and bathe with left UE use    Baseline left shoulder ROM grossly limited    Time 8    Period Weeks    Status New    Target Date 03/28/20      PT LONG TERM GOAL #3   Title Left shoulder strength grossly 5/5 to improve ability for lifting activities and to assist return to work pending MD clearance    Baseline MMTs not tested at eval    Time 8    Period Weeks    Status New    Target Date 03/28/20      PT LONG TERM GOAL #4   Title Perform reaching ADLs including dressing, bathing, chores as needed with left shoulder pain 3/10 or less    Baseline 7/10    Time 8    Period Weeks    Status New    Target Date 03/28/20                 Plan - 02/09/20 1019    Clinical Impression Statement Progressed with Gentle AAROM using UE ranger. Pt reports increased soreness after therex. Continued with PROM, he is guarded. STW performed to shoulder and PROM afterward was improved. Trial again of Vaso and warmer setting as pt reported it was too cold at previous sessions.    PT Next Visit Plan continue with STW and edema management if vaso tolerated well last session. caution with left wrist due to carpal tunnel surgery for gripping for wand/pulleys etc. (no wrist restrictions other than no liting for now) left shoulder PROM/stretching focus, continue/progress AAROM as noted, progress as tolerated per acromioplasty protocol, cryo/vaso prn    PT Home Exercise Plan Access code: [email or web address]           Patient will benefit from skilled therapeutic intervention in order to improve the following deficits and impairments:  Pain, Impaired UE functional use, Decreased strength, Decreased range of motion, Hypomobility  Visit Diagnosis: Incomplete tear of left rotator cuff, unspecified whether traumatic  Chronic left shoulder pain  Stiffness of  left shoulder, not elsewhere classified  Muscle weakness (generalized)     Problem  List Patient Active Problem List   Diagnosis Date Noted  . Left carpal tunnel syndrome 12/27/2019  . Adhesive capsulitis of left shoulder 12/27/2019  . Tendinopathy of left biceps 12/27/2019  . Hyperlipidemia with target LDL less than 100 06/23/2012  . Family history of heart disease in male family member before age 82 06/23/2012  . Current smoker 06/23/2012    Sherrie Mustache, PTA 02/09/2020, 10:43 AM  [facility] [address] [place], [place], [address] Phone: [phone]   Fax:  [phone]  Name: BEE HAMMERSCHMIDT MRN: [id number] Date of Birth: 17-Feb-1956

## 2020-02-12 ENCOUNTER — Encounter: Payer: Self-pay | Admitting: Physical Therapy

## 2020-02-12 ENCOUNTER — Ambulatory Visit: Payer: No Typology Code available for payment source | Admitting: Physical Therapy

## 2020-02-12 ENCOUNTER — Other Ambulatory Visit: Payer: Self-pay

## 2020-02-12 DIAGNOSIS — M25612 Stiffness of left shoulder, not elsewhere classified: Secondary | ICD-10-CM

## 2020-02-12 DIAGNOSIS — G8929 Other chronic pain: Secondary | ICD-10-CM

## 2020-02-12 DIAGNOSIS — M25512 Pain in left shoulder: Secondary | ICD-10-CM

## 2020-02-12 DIAGNOSIS — M75112 Incomplete rotator cuff tear or rupture of left shoulder, not specified as traumatic: Secondary | ICD-10-CM

## 2020-02-12 DIAGNOSIS — M6281 Muscle weakness (generalized): Secondary | ICD-10-CM

## 2020-02-12 NOTE — Therapy (Signed)
[facility] Outpatient Rehabilitation [facility] [address] [place], [place], [address] Phone: [phone]   Fax:  [phone]  Physical Therapy Treatment  Patient Details  Name: Oscar Myers MRN: [id number] Date of Birth: [birth year] Referring Provider (PT): Jari Sportsman, PA-C (surgery by Gershon Mussel, MD)   Encounter Date: 02/12/2020   PT End of Session - 02/12/20 0849    Visit Number 5    Number of Visits 15    Date for PT Re-Evaluation 03/28/20    Authorization Type [facility]    Authorization Time Period 11/03/19-05/01/20    Authorization - Visit Number 4    Authorization - Number of Visits 15           Past Medical History:  Diagnosis Date  . CTS (carpal tunnel syndrome)   . Dyslipidemia     Past Surgical History:  Procedure Laterality Date  . BACK SURGERY    . CARPAL TUNNEL RELEASE Left 04/30/2016   Procedure: LEFT CARPAL TUNNEL RELEASE ENDOSCOPIC;  Surgeon: Sheral Apley, MD;  Location: [facility];  Service: Orthopedics;  Laterality: Left;  . CARPAL TUNNEL RELEASE Left 01/10/2020   Procedure: LEFT CARPAL TUNNEL RELEASE;  Surgeon: Tarry Kos, MD;  Location: [facility];  Service: Orthopedics;  Laterality: Left;  . COLONOSCOPY  2012  . SHOULDER ARTHROSCOPY WITH ROTATOR CUFF REPAIR AND SUBACROMIAL DECOMPRESSION Left 01/10/2020   Procedure: LEFT SHOULDER ARTHROSCOPY WITH EXTENSIVE DEBRIDEMENT, SUBACROMIAL DECOMPRESSION, DISTAL CLAVICLE EXCISION, MANIPULATION UNDER ANESTHESIA;  Surgeon: Tarry Kos, MD;  [facility];  Service: Orthopedics;  Laterality: Left;    There were no vitals filed for this visit.   Subjective Assessment - 02/12/20 0848    Subjective Shoulder is sore, always sore.    Currently in Pain? Yes    Pain Score 7     Pain Location Shoulder    Pain Orientation Left    Pain Descriptors / Indicators Sore    Pain Type Chronic pain    Aggravating Factors  reaching and lifting    Pain  Relieving Factors ice,meds              [facility] PT Assessment - 02/12/20 0001      PROM   Left Shoulder Flexion 105 Degrees    Left Shoulder ABduction 80 Degrees    Left Shoulder External Rotation 40 Degrees                         [facility] Adult PT Treatment/Exercise - 02/12/20 0001      Shoulder Exercises: Standing   Extension Both;20 reps    Theraband Level (Shoulder Extension) Level 2 (Red)    Row 20 reps;Both;Theraband    Theraband Level (Shoulder Row) Level 2 (Red)    Other Standing Exercises Ue ranger for IR     Other Standing Exercises standing cane flexion, scaption x 10 each, ER x 10        Shoulder Exercises: Pulleys   Flexion 3 minutes      Vasopneumatic   Number Minutes Vasopneumatic  10 minutes    Vasopnuematic Location  Shoulder    Vasopneumatic Pressure Low    Vasopneumatic Temperature  40   2 snow flakes     Manual Therapy   Soft tissue mobilization left anterior, lateral and posterior shoulder      Passive ROM Left shoulder 4-way PROM  PT Short Term Goals - 02/01/20 1535      PT SHORT TERM GOAL #1   Title Independent with initial HEP    Baseline instructed at eval    Time 4    Period Weeks    Status New      PT SHORT TERM GOAL #2   Title Increase left shoulder AROM to at least 90 deg or greater to work towards improved reaching ability for ADLs such as grooming    Baseline 40 deg AROM, 30 deg PROM limited by pain and muscle guarding    Time 4    Period Weeks    Status New             PT Long Term Goals - 02/01/20 1536      PT LONG TERM GOAL #1   Title Improve FOTO outcome measure score to 37% or less impairment    Baseline 60% limited    Time 8    Period Weeks    Status New    Target Date 03/28/20      PT LONG TERM GOAL #2   Title Left shoulder AROM grossly WFL to dress and bathe with left UE use    Baseline left shoulder ROM grossly limited    Time 8    Period Weeks    Status New     Target Date 03/28/20      PT LONG TERM GOAL #3   Title Left shoulder strength grossly 5/5 to improve ability for lifting activities and to assist return to work pending MD clearance    Baseline MMTs not tested at eval    Time 8    Period Weeks    Status New    Target Date 03/28/20      PT LONG TERM GOAL #4   Title Perform reaching ADLs including dressing, bathing, chores as needed with left shoulder pain 3/10 or less    Baseline 7/10    Time 8    Period Weeks    Status New    Target Date 03/28/20                 Plan - 02/12/20 1003    Clinical Impression Statement Pt reports continued soreness. He reports min improvement with STW and Vaso last session. Continued with AAROM, PROM and STW. He is still guarded and has difficulty relaxing for PROM.    PT Next Visit Plan continue with STW and edema management if vaso tolerated well last session. caution with left wrist due to carpal tunnel surgery for gripping for wand/pulleys etc. (no wrist restrictions other than no liting for now) left shoulder PROM/stretching focus, continue/progress AAROM as noted, progress as tolerated per acromioplasty protocol, cryo/vaso prn    PT Home Exercise Plan Access code: [email or web address]           Patient will benefit from skilled therapeutic intervention in order to improve the following deficits and impairments:  Pain, Impaired UE functional use, Decreased strength, Decreased range of motion, Hypomobility  Visit Diagnosis: Incomplete tear of left rotator cuff, unspecified whether traumatic  Chronic left shoulder pain  Stiffness of left shoulder, not elsewhere classified  Muscle weakness (generalized)     Problem List Patient Active Problem List   Diagnosis Date Noted  . Left carpal tunnel syndrome 12/27/2019  . Adhesive capsulitis of left shoulder 12/27/2019  . Tendinopathy of left biceps 12/27/2019  . Hyperlipidemia with target LDL less than 100 06/23/2012  . Family history of  heart  disease in male family member before age 26 06/23/2012  . Current smoker 06/23/2012    Sherrie Mustache, PTA 02/12/2020, 10:37 AM  [facility] [address] [place], [place], [address] Phone: [phone]   Fax:  [phone]  Name: Oscar Myers MRN: [id number] Date of Birth: 1956/01/10

## 2020-02-14 ENCOUNTER — Telehealth: Payer: Self-pay | Admitting: Orthopaedic Surgery

## 2020-02-14 NOTE — Telephone Encounter (Signed)
Received call from patient stating [facility] did not receive [facility] that I faxed 10/7. I advised that I did receive confirmation and that I will refax. He will call them to follow up.

## 2020-02-20 ENCOUNTER — Ambulatory Visit: Payer: No Typology Code available for payment source | Admitting: Physical Therapy

## 2020-02-20 ENCOUNTER — Encounter: Payer: Self-pay | Admitting: Physical Therapy

## 2020-02-20 ENCOUNTER — Other Ambulatory Visit: Payer: Self-pay

## 2020-02-20 DIAGNOSIS — M25612 Stiffness of left shoulder, not elsewhere classified: Secondary | ICD-10-CM

## 2020-02-20 DIAGNOSIS — M6281 Muscle weakness (generalized): Secondary | ICD-10-CM

## 2020-02-20 DIAGNOSIS — M25512 Pain in left shoulder: Secondary | ICD-10-CM

## 2020-02-20 DIAGNOSIS — M75112 Incomplete rotator cuff tear or rupture of left shoulder, not specified as traumatic: Secondary | ICD-10-CM

## 2020-02-20 DIAGNOSIS — G8929 Other chronic pain: Secondary | ICD-10-CM

## 2020-02-20 NOTE — Therapy (Signed)
[facility] Outpatient Rehabilitation [facility] [address] [place], [place], [address] Phone: [phone]   Fax:  [phone]  Physical Therapy Treatment   Patient Details  Name: Oscar Myers MRN: [id number] Date of Birth: Sep 23, 1955 Referring Provider (PT): Jari Sportsman, PA-C (surgery by Gershon Mussel, MD)   Encounter Date: 02/20/2020   PT End of Session - 02/20/20 1358    Visit Number 6    Number of Visits 15    Date for PT Re-Evaluation 03/28/20    Authorization Type [facility]    Authorization Time Period 11/03/19-05/01/20    Authorization - Visit Number 6    Authorization - Number of Visits 15    PT Start Time 1330    PT Stop Time 1410    PT Time Calculation (min) 40 min    Activity Tolerance Patient limited by pain    Behavior During Therapy [facility] for tasks assessed/performed           Past Medical History:  Diagnosis Date  . CTS (carpal tunnel syndrome)   . Dyslipidemia     Past Surgical History:  Procedure Laterality Date  . BACK SURGERY    . CARPAL TUNNEL RELEASE Left 04/30/2016   Procedure: LEFT CARPAL TUNNEL RELEASE ENDOSCOPIC;  Surgeon: Sheral Apley, MD;  Location: [facility];  Service: Orthopedics;  Laterality: Left;  . CARPAL TUNNEL RELEASE Left 01/10/2020   Procedure: LEFT CARPAL TUNNEL RELEASE;  Surgeon: Tarry Kos, MD;  Location: [facility];  Service: Orthopedics;  Laterality: Left;  . COLONOSCOPY  2012  . SHOULDER ARTHROSCOPY WITH ROTATOR CUFF REPAIR AND SUBACROMIAL DECOMPRESSION Left 01/10/2020   Procedure: LEFT SHOULDER ARTHROSCOPY WITH EXTENSIVE DEBRIDEMENT, SUBACROMIAL DECOMPRESSION, DISTAL CLAVICLE EXCISION, MANIPULATION UNDER ANESTHESIA;  Surgeon: Tarry Kos, MD;  Location: [facility];  Service: Orthopedics;  Laterality: Left;    There were no vitals filed for this visit.   Subjective Assessment - 02/20/20 1335    Subjective Patient reports his shoulder is better. He reports it is  always sore after treatment.    Pertinent History remote history of prevoius left shoulder surgery in the late 1970s, pt. also has right shoulder pain issues as well    Limitations House hold activities;Lifting    Patient Stated Goals Get shoulder pain, return to work    Currently in Pain? Yes    Pain Score 5     Pain Location Shoulder    Pain Orientation Left    Pain Descriptors / Indicators Aching    Pain Type Acute pain    Pain Onset More than a month ago    Pain Frequency Constant    Aggravating Factors  reaching and lifting    Pain Relieving Factors ice and meds    Effect of Pain on Daily Activities limits ability to reach              [facility] PT Assessment - 02/20/20 0001      PROM   Left Shoulder Flexion 116 Degrees    Left Shoulder External Rotation 60 Degrees                         [facility] Adult PT Treatment/Exercise - 02/20/20 0001      Shoulder Exercises: Supine   External Rotation Limitations supine wand ER 2 x 10 reps    Other Supine Exercises wand flecxion 2x10       Shoulder Exercises: Prone   Retraction  Limitations prone row 2x15 2lb     Extension Limitations 2x15 2#       Shoulder Exercises: Standing   Extension Both;20 reps    Theraband Level (Shoulder Extension) Level 2 (Red)    Row 20 reps;Both;Theraband    Theraband Level (Shoulder Row) Level 2 (Red)    Other Standing Exercises Ue ranger for IR     Other Standing Exercises standing cane flexion, scaption x 10 each, ER x 10        Manual Therapy   Soft tissue mobilization left anterior, lateral and posterior shoulder      Passive ROM Left shoulder 4-way PROM                  PT Education - 02/20/20 1358    Education Details reviewed improtance of stretching for home    Person(s) Educated Patient    Methods Explanation;Demonstration;Verbal cues;Tactile cues    Comprehension Verbalized understanding;Returned demonstration;Verbal cues required;Tactile cues required             PT Short Term Goals - 02/01/20 1535      PT SHORT TERM GOAL #1   Title Independent with initial HEP    Baseline instructed at eval    Time 4    Period Weeks    Status New      PT SHORT TERM GOAL #2   Title Increase left shoulder AROM to at least 90 deg or greater to work towards improved reaching ability for ADLs such as grooming    Baseline 40 deg AROM, 30 deg PROM limited by pain and muscle guarding    Time 4    Period Weeks    Status New             PT Long Term Goals - 02/01/20 1536      PT LONG TERM GOAL #1   Title Improve FOTO outcome measure score to 37% or less impairment    Baseline 60% limited    Time 8    Period Weeks    Status New    Target Date 03/28/20      PT LONG TERM GOAL #2   Title Left shoulder AROM grossly WFL to dress and bathe with left UE use    Baseline left shoulder ROM grossly limited    Time 8    Period Weeks    Status New    Target Date 03/28/20      PT LONG TERM GOAL #3   Title Left shoulder strength grossly 5/5 to improve ability for lifting activities and to assist return to work pending MD clearance    Baseline MMTs not tested at eval    Time 8    Period Weeks    Status New    Target Date 03/28/20      PT LONG TERM GOAL #4   Title Perform reaching ADLs including dressing, bathing, chores as needed with left shoulder pain 3/10 or less    Baseline 7/10    Time 8    Period Weeks    Status New    Target Date 03/28/20                 Plan - 02/20/20 1359    Clinical Impression Statement Patient remains gaurded and limited in ER. He is able to perfrom all ther-ex but shows non-verbal signs of pain. He reports soreness after his treatment. Overall hie is makingprogress. He has shown significant improvement in ER and flexion  passive movement. Therapy attempted prone scap strengthening but the patient did not like lying on his stomach. Therapy will continue to advance exercises as tolerated. Therapy added light RTC  strengthening.    Personal Factors and Comorbidities Other    Examination-Activity Limitations Lift;Hygiene/Grooming;Bathing;Dressing;Sleep;Bed Mobility;Carry;Reach Overhead    Examination-Participation Restrictions Driving;Meal Prep;Laundry;Cleaning;Occupation    Stability/Clinical Decision Making Stable/Uncomplicated    Clinical Decision Making Low    Rehab Potential Good    PT Duration 8 weeks    PT Treatment/Interventions ADLs/Self Care Home Management;Cryotherapy;Electrical Stimulation;Moist Heat;Therapeutic activities;Therapeutic exercise;Patient/family education;Manual techniques;Passive range of motion;Dry needling;Neuromuscular re-education;Taping;Vasopneumatic Device    PT Next Visit Plan continue with STW and edema management if vaso tolerated well last session. caution with left wrist due to carpal tunnel surgery for gripping for wand/pulleys etc. (no wrist restrictions other than no liting for now) left shoulder PROM/stretching focus, continue/progress AAROM as noted, progress as tolerated per acromioplasty protocol, cryo/vaso prn    PT Home Exercise Plan Access code: [email or web address]    Consulted and Agree with Plan of Care Patient           Patient will benefit from skilled therapeutic intervention in order to improve the following deficits and impairments:  Pain, Impaired UE functional use, Decreased strength, Decreased range of motion, Hypomobility  Visit Diagnosis: Incomplete tear of left rotator cuff, unspecified whether traumatic  Chronic left shoulder pain  Stiffness of left shoulder, not elsewhere classified  Muscle weakness (generalized)     Problem List Patient Active Problem List   Diagnosis Date Noted  . Left carpal tunnel syndrome 12/27/2019  . Adhesive capsulitis of left shoulder 12/27/2019  . Tendinopathy of left biceps 12/27/2019  . Hyperlipidemia with target LDL less than 100 06/23/2012  . Family history of heart disease in male family member before age  51 06/23/2012  . Current smoker 06/23/2012    Dessie Coma PT DPT  02/20/2020, 4:40 PM  [facility] [address] [place], [place], [address] Phone: [phone]   Fax:  [phone]  Name: Oscar Myers MRN: [id number] Date of Birth: 1955/06/04

## 2020-02-21 ENCOUNTER — Ambulatory Visit (INDEPENDENT_AMBULATORY_CARE_PROVIDER_SITE_OTHER): Payer: No Typology Code available for payment source | Admitting: Orthopaedic Surgery

## 2020-02-21 ENCOUNTER — Encounter: Payer: Self-pay | Admitting: Orthopaedic Surgery

## 2020-02-21 DIAGNOSIS — Z9889 Other specified postprocedural states: Secondary | ICD-10-CM

## 2020-02-21 MED ORDER — HYDROCODONE-ACETAMINOPHEN 5-325 MG PO TABS
1.0000 | ORAL_TABLET | Freq: Every day | ORAL | 0 refills | Status: DC | PRN
Start: 2020-02-21 — End: 2020-04-06

## 2020-02-21 NOTE — Progress Notes (Signed)
Post-Op Visit Note   Patient: Oscar Myers           Date of Birth: 1955-06-19           MRN: [id number] Visit Date: 02/21/2020 PCP: Ronnald Nian, MD   Assessment & Plan:  Chief Complaint:  Chief Complaint  Patient presents with  . Left Shoulder - Pain  . Left Hand - Pain   Visit Diagnoses:  1. S/P carpal tunnel release   2. S/P arthroscopy of left shoulder     Plan: Oscar Myers is 6 weeks status post left shoulder arthroscopy shoulder manipulation left carpal tunnel release.  He comes in for routine follow-up.  He is doing well overall and progressing through physical therapy well.  Overall his symptoms have improved from the surgery.  He is able to make a fist with his left hand.  He has occasional shooting pain into his left hand but this is a significant improvement since the surgery.  His shoulder still a little sore.  His for flexion is 116 degrees per PT report yesterday.  Overall he is making great progress.  I am happy with his progress as well.  He should continue with his rehab.  Norco refill today.  Recheck in 6 weeks.  Follow-Up Instructions: Return in about 6 weeks (around 04/03/2020).   Orders:  No orders of the defined types were placed in this encounter.  Meds ordered this encounter  Medications  . HYDROcodone-acetaminophen (NORCO) 5-325 MG tablet    Sig: Take 1 tablet by mouth daily as needed.    Dispense:  20 tablet    Refill:  0    Imaging: No results found.  PMFS History: Patient Active Problem List   Diagnosis Date Noted  . Left carpal tunnel syndrome 12/27/2019  . Adhesive capsulitis of left shoulder 12/27/2019  . Tendinopathy of left biceps 12/27/2019  . Hyperlipidemia with target LDL less than 100 06/23/2012  . Family history of heart disease in male family member before age 102 06/23/2012  . Current smoker 06/23/2012   Past Medical History:  Diagnosis Date  . CTS (carpal tunnel syndrome)   . Dyslipidemia     History reviewed. No  pertinent family history.  Past Surgical History:  Procedure Laterality Date  . BACK SURGERY    . CARPAL TUNNEL RELEASE Left 04/30/2016   Procedure: LEFT CARPAL TUNNEL RELEASE ENDOSCOPIC;  Surgeon: Sheral Apley, MD;  Location: [facility];  Service: Orthopedics;  Laterality: Left;  . CARPAL TUNNEL RELEASE Left 01/10/2020   Procedure: LEFT CARPAL TUNNEL RELEASE;  Surgeon: Tarry Kos, MD;  Location: [facility];  Service: Orthopedics;  Laterality: Left;  . COLONOSCOPY  2012  . SHOULDER ARTHROSCOPY WITH ROTATOR CUFF REPAIR AND SUBACROMIAL DECOMPRESSION Left 01/10/2020   Procedure: LEFT SHOULDER ARTHROSCOPY WITH EXTENSIVE DEBRIDEMENT, SUBACROMIAL DECOMPRESSION, DISTAL CLAVICLE EXCISION, MANIPULATION UNDER ANESTHESIA;  Surgeon: Tarry Kos, MD;  [facility];  Service: Orthopedics;  Laterality: Left;   Social History   Occupational History  . Not on file  Tobacco Use  . Smoking status: Former Smoker    Packs/day: 0.50    Years: 25.00    Pack years: 12.50    Types: Cigarettes  . Smokeless tobacco: Never Used  . Tobacco comment: as of 5 9 2012  Vaping Use  . Vaping Use: Never used  Substance and Sexual Activity  . Alcohol use: Yes    Comment: socially- 5 per week  .  Drug use: No  . Sexual activity: Yes

## 2020-02-22 ENCOUNTER — Ambulatory Visit: Payer: No Typology Code available for payment source | Admitting: Physical Therapy

## 2020-02-27 ENCOUNTER — Other Ambulatory Visit: Payer: Self-pay

## 2020-02-27 ENCOUNTER — Encounter: Payer: Self-pay | Admitting: Physical Therapy

## 2020-02-27 ENCOUNTER — Ambulatory Visit: Payer: No Typology Code available for payment source | Admitting: Physical Therapy

## 2020-02-27 DIAGNOSIS — G8929 Other chronic pain: Secondary | ICD-10-CM

## 2020-02-27 DIAGNOSIS — M6281 Muscle weakness (generalized): Secondary | ICD-10-CM

## 2020-02-27 DIAGNOSIS — M75112 Incomplete rotator cuff tear or rupture of left shoulder, not specified as traumatic: Secondary | ICD-10-CM | POA: Diagnosis not present

## 2020-02-27 DIAGNOSIS — M25612 Stiffness of left shoulder, not elsewhere classified: Secondary | ICD-10-CM

## 2020-02-27 DIAGNOSIS — M25512 Pain in left shoulder: Secondary | ICD-10-CM

## 2020-02-27 NOTE — Therapy (Signed)
[facility] Outpatient Rehabilitation [facility] [address] [place], [place], [address] Phone: [phone]   Fax:  [phone]  Physical Therapy Treatment  Patient Details  Name: KARANVEER RAMAKRISHNAN MRN: [id number] Date of Birth: January 16, 1956 Referring Provider (PT): Jari Sportsman, PA-C (surgery by Gershon Mussel, MD)   Encounter Date: 02/27/2020   PT End of Session - 02/27/20 1620    Visit Number 7    Number of Visits 15    Date for PT Re-Evaluation 03/28/20    Authorization Type [facility]    Authorization Time Period 11/03/19-05/01/20    Authorization - Visit Number 7    Authorization - Number of Visits 15    PT Start Time 1550    PT Stop Time 1628    PT Time Calculation (min) 38 min    Activity Tolerance Patient tolerated treatment well    Behavior During Therapy [facility] for tasks assessed/performed           Past Medical History:  Diagnosis Date  . CTS (carpal tunnel syndrome)   . Dyslipidemia     Past Surgical History:  Procedure Laterality Date  . BACK SURGERY    . CARPAL TUNNEL RELEASE Left 04/30/2016   Procedure: LEFT CARPAL TUNNEL RELEASE ENDOSCOPIC;  Surgeon: Sheral Apley, MD;  Location: [facility];  Service: Orthopedics;  Laterality: Left;  . CARPAL TUNNEL RELEASE Left 01/10/2020   Procedure: LEFT CARPAL TUNNEL RELEASE;  Surgeon: Tarry Kos, MD;  Location: [facility];  Service: Orthopedics;  Laterality: Left;  . COLONOSCOPY  2012  . SHOULDER ARTHROSCOPY WITH ROTATOR CUFF REPAIR AND SUBACROMIAL DECOMPRESSION Left 01/10/2020   Procedure: LEFT SHOULDER ARTHROSCOPY WITH EXTENSIVE DEBRIDEMENT, SUBACROMIAL DECOMPRESSION, DISTAL CLAVICLE EXCISION, MANIPULATION UNDER ANESTHESIA;  Surgeon: Tarry Kos, MD;  Location: [facility];  Service: Orthopedics;  Laterality: Left;    There were no vitals filed for this visit.   Subjective Assessment - 02/27/20 1552    Subjective Pt. cancelled last session due to soreness  including after therapy session and with activity but pain not as severe today. He had MD follow up last week with good report.    Currently in Pain? Yes    Pain Score 5     Pain Location Shoulder    Pain Orientation Left    Pain Descriptors / Indicators Aching    Pain Type Acute pain    Pain Onset More than a month ago    Pain Frequency Constant    Aggravating Factors  reaching activities    Pain Relieving Factors ice and medication    Effect of Pain on Daily Activities limits ability reaching ADLs                             [facility] Adult PT Treatment/Exercise - 02/27/20 0001      Shoulder Exercises: Standing   Extension AROM;AAROM;Both;20 reps    Theraband Level (Shoulder Extension) Level 2 (Red)    Row 20 reps;Both;Theraband    Theraband Level (Shoulder Row) Level 2 (Red)    Other Standing Exercises P-ball flexion AAROM with 65 cm P-ball roll on inlcine wedge on high table x 20 reps      Shoulder Exercises: Pulleys   Flexion 2 minutes    Scaption 2 minutes      Modalities   Modalities --   pt. declined cryo     Manual Therapy   Joint Mobilization left GH  mobilization AP and caudal glides grade I-III    Soft tissue mobilization left posterior scapular region and subscapularis    Passive ROM Left shoulder 4-way PROM                    PT Short Term Goals - 02/01/20 1535      PT SHORT TERM GOAL #1   Title Independent with initial HEP    Baseline instructed at eval    Time 4    Period Weeks    Status New      PT SHORT TERM GOAL #2   Title Increase left shoulder AROM to at least 90 deg or greater to work towards improved reaching ability for ADLs such as grooming    Baseline 40 deg AROM, 30 deg PROM limited by pain and muscle guarding    Time 4    Period Weeks    Status New             PT Long Term Goals - 02/01/20 1536      PT LONG TERM GOAL #1   Title Improve FOTO outcome measure score to 37% or less impairment    Baseline 60%  limited    Time 8    Period Weeks    Status New    Target Date 03/28/20      PT LONG TERM GOAL #2   Title Left shoulder AROM grossly WFL to dress and bathe with left UE use    Baseline left shoulder ROM grossly limited    Time 8    Period Weeks    Status New    Target Date 03/28/20      PT LONG TERM GOAL #3   Title Left shoulder strength grossly 5/5 to improve ability for lifting activities and to assist return to work pending MD clearance    Baseline MMTs not tested at eval    Time 8    Period Weeks    Status New    Target Date 03/28/20      PT LONG TERM GOAL #4   Title Perform reaching ADLs including dressing, bathing, chores as needed with left shoulder pain 3/10 or less    Baseline 7/10    Time 8    Period Weeks    Status New    Target Date 03/28/20                 Plan - 02/27/20 1629    Clinical Impression Statement Initial stiffness limiting shoulder motion but eased during session to ROM levels on par with last visit. Focus manual therapy to address ROM limitations but continued previous exercise progression with good tolerance. Therapy goals still ongoing for progess.    Personal Factors and Comorbidities Other    Examination-Activity Limitations Lift;Hygiene/Grooming;Bathing;Dressing;Sleep;Bed Mobility;Carry;Reach Overhead    Examination-Participation Restrictions Driving;Meal Prep;Laundry;Cleaning;Occupation    Stability/Clinical Decision Making Stable/Uncomplicated    Clinical Decision Making Low    Rehab Potential Good    PT Frequency --   2-3x/week   PT Duration 8 weeks    PT Treatment/Interventions ADLs/Self Care Home Management;Cryotherapy;Electrical Stimulation;Moist Heat;Therapeutic activities;Therapeutic exercise;Patient/family education;Manual techniques;Passive range of motion;Dry needling;Neuromuscular re-education;Taping;Vasopneumatic Device    PT Next Visit Plan continue ROM emphasis with PROM, STM/joint mobs, progress as tolerated AAROM and  AROM/strengthening    PT Home Exercise Plan Access code: [email or web address]    Consulted and Agree with Plan of Care Patient           Patient will  benefit from skilled therapeutic intervention in order to improve the following deficits and impairments:  Pain, Impaired UE functional use, Decreased strength, Decreased range of motion, Hypomobility  Visit Diagnosis: Incomplete tear of left rotator cuff, unspecified whether traumatic  Chronic left shoulder pain  Stiffness of left shoulder, not elsewhere classified  Muscle weakness (generalized)     Problem List Patient Active Problem List   Diagnosis Date Noted  . Left carpal tunnel syndrome 12/27/2019  . Adhesive capsulitis of left shoulder 12/27/2019  . Tendinopathy of left biceps 12/27/2019  . Hyperlipidemia with target LDL less than 100 06/23/2012  . Family history of heart disease in male family member before age 26 06/23/2012  . Current smoker 06/23/2012    Lazarus Gowda, PT, DPT 02/27/20 4:42 PM  [facility] Outpatient Rehabilitation [facility] [address] [place], [place], [address] Phone: [phone]   Fax:  [phone]  Name: ALEXES LAMARQUE MRN: [id number] Date of Birth: 16-Sep-1955

## 2020-02-29 ENCOUNTER — Ambulatory Visit: Payer: No Typology Code available for payment source | Admitting: Physical Therapy

## 2020-03-01 ENCOUNTER — Other Ambulatory Visit: Payer: Self-pay

## 2020-03-01 ENCOUNTER — Encounter: Payer: Self-pay | Admitting: Physical Therapy

## 2020-03-01 ENCOUNTER — Ambulatory Visit: Payer: No Typology Code available for payment source | Admitting: Physical Therapy

## 2020-03-01 DIAGNOSIS — M75112 Incomplete rotator cuff tear or rupture of left shoulder, not specified as traumatic: Secondary | ICD-10-CM | POA: Diagnosis not present

## 2020-03-01 DIAGNOSIS — M25612 Stiffness of left shoulder, not elsewhere classified: Secondary | ICD-10-CM

## 2020-03-01 DIAGNOSIS — G8929 Other chronic pain: Secondary | ICD-10-CM

## 2020-03-01 DIAGNOSIS — M6281 Muscle weakness (generalized): Secondary | ICD-10-CM

## 2020-03-01 DIAGNOSIS — M25512 Pain in left shoulder: Secondary | ICD-10-CM

## 2020-03-01 NOTE — Therapy (Signed)
[facility] Outpatient Rehabilitation [facility] [address] [place], [place], [address] Phone: [phone]   Fax:  [phone]  Physical Therapy Treatment  Patient Details  Name: Oscar Myers MRN: [id number] Date of Birth: 1955-10-14 Referring Provider (PT): Jari Sportsman, PA-C (surgery by Gershon Mussel, MD)   Encounter Date: 03/01/2020   PT End of Session - 03/01/20 1102    Visit Number 8    Number of Visits 15    Date for PT Re-Evaluation 03/28/20    Authorization Type [place]    Authorization Time Period 11/03/19-05/01/20    Authorization - Visit Number 8    Authorization - Number of Visits 15    PT Start Time 1100    PT Stop Time 1139    PT Time Calculation (min) 39 min    Activity Tolerance Patient tolerated treatment well    Behavior During Therapy [facility] for tasks assessed/performed           Past Medical History:  Diagnosis Date  . CTS (carpal tunnel syndrome)   . Dyslipidemia     Past Surgical History:  Procedure Laterality Date  . BACK SURGERY    . CARPAL TUNNEL RELEASE Left 04/30/2016   Procedure: LEFT CARPAL TUNNEL RELEASE ENDOSCOPIC;  Surgeon: Sheral Apley, MD;  Location: [facility];  Service: Orthopedics;  Laterality: Left;  . CARPAL TUNNEL RELEASE Left 01/10/2020   Procedure: LEFT CARPAL TUNNEL RELEASE;  Surgeon: Tarry Kos, MD;  Location: [facility];  Service: Orthopedics;  Laterality: Left;  . COLONOSCOPY  2012  . SHOULDER ARTHROSCOPY WITH ROTATOR CUFF REPAIR AND SUBACROMIAL DECOMPRESSION Left 01/10/2020   Procedure: LEFT SHOULDER ARTHROSCOPY WITH EXTENSIVE DEBRIDEMENT, SUBACROMIAL DECOMPRESSION, DISTAL CLAVICLE EXCISION, MANIPULATION UNDER ANESTHESIA;  Surgeon: Tarry Kos, MD;  Location: [facility];  Service: Orthopedics;  Laterality: Left;    There were no vitals filed for this visit.   Subjective Assessment - 03/01/20 1100    Subjective Sore after last session. Left shoulder pain 6-7/10  this AM. ROM feels a little looser.    Currently in Pain? Yes    Pain Score --   6-7   Pain Location Shoulder    Pain Orientation Left    Pain Descriptors / Indicators Aching    Pain Type Acute pain    Pain Onset More than a month ago    Pain Frequency Constant    Aggravating Factors  reaching activities    Pain Relieving Factors ice medication    Effect of Pain on Daily Activities limits ability reaching ADLs              [facility] PT Assessment - 03/01/20 0001      PROM   Left Shoulder Flexion 116 Degrees    Left Shoulder Internal Rotation 45 Degrees    Left Shoulder External Rotation 64 Degrees                         [facility] Adult PT Treatment/Exercise - 03/01/20 0001      Shoulder Exercises: Supine   Other Supine Exercises supine wand "chest press" AAROM x 15 reps      Shoulder Exercises: Sidelying   External Rotation AROM;Left;15 reps      Shoulder Exercises: Standing   Extension AROM;Strengthening;Both;20 reps    Theraband Level (Shoulder Extension) Level 2 (Red)    Row 20 reps;Both;Theraband    Theraband Level (Shoulder Row) Level 2 (Red)  Other Standing Exercises UE ranger AAROM flexion and scaption x 10 ea.      Shoulder Exercises: Pulleys   Flexion 2 minutes    Scaption 2 minutes      Shoulder Exercises: Isometric Strengthening   Flexion Limitations 5 sec x 10 reps in sitting with right hand as resistance    External Rotation Limitations 5 sec x 10 in supine    Internal Rotation Limitations 5 sec x 10 reps in supine    ABduction Limitations 5 sec x 10 reps in supine      Manual Therapy   Joint Mobilization left GH mobilization distraction and  AP and caudal glides grade I-III    Soft tissue mobilization left posterior scapular region and subscapularis    Passive ROM Left shoulder 4-way PROM                    PT Short Term Goals - 02/01/20 1535      PT SHORT TERM GOAL #1   Title Independent with initial HEP    Baseline  instructed at eval    Time 4    Period Weeks    Status New      PT SHORT TERM GOAL #2   Title Increase left shoulder AROM to at least 90 deg or greater to work towards improved reaching ability for ADLs such as grooming    Baseline 40 deg AROM, 30 deg PROM limited by pain and muscle guarding    Time 4    Period Weeks    Status New             PT Long Term Goals - 02/01/20 1536      PT LONG TERM GOAL #1   Title Improve FOTO outcome measure score to 37% or less impairment    Baseline 60% limited    Time 8    Period Weeks    Status New    Target Date 03/28/20      PT LONG TERM GOAL #2   Title Left shoulder AROM grossly WFL to dress and bathe with left UE use    Baseline left shoulder ROM grossly limited    Time 8    Period Weeks    Status New    Target Date 03/28/20      PT LONG TERM GOAL #3   Title Left shoulder strength grossly 5/5 to improve ability for lifting activities and to assist return to work pending MD clearance    Baseline MMTs not tested at eval    Time 8    Period Weeks    Status New    Target Date 03/28/20      PT LONG TERM GOAL #4   Title Perform reaching ADLs including dressing, bathing, chores as needed with left shoulder pain 3/10 or less    Baseline 7/10    Time 8    Period Weeks    Status New    Target Date 03/28/20                 Plan - 03/01/20 1145    Clinical Impression Statement Pain is still limiting factor for shoulder AROM progression with limitations in flexion/abduction AROM. Still with stiffness but PROM still improved from baseline, making gradual gains with ROM otherwise.    Personal Factors and Comorbidities Other    Examination-Activity Limitations Lift;Hygiene/Grooming;Bathing;Dressing;Sleep;Bed Mobility;Carry;Reach Overhead    Examination-Participation Restrictions Driving;Meal Prep;Laundry;Cleaning;Occupation    Stability/Clinical Decision Making Stable/Uncomplicated  Clinical Decision Making Low    Rehab  Potential Good    PT Frequency --   2-3x/week   PT Duration 8 weeks    PT Treatment/Interventions ADLs/Self Care Home Management;Cryotherapy;Electrical Stimulation;Moist Heat;Therapeutic activities;Therapeutic exercise;Patient/family education;Manual techniques;Passive range of motion;Dry needling;Neuromuscular re-education;Taping;Vasopneumatic Device    PT Next Visit Plan continue ROM emphasis with PROM, STM/joint mobs, progress as tolerated AAROM and AROM/strengthening    PT Home Exercise Plan Access code: [email or web address]    Consulted and Agree with Plan of Care Patient           Patient will benefit from skilled therapeutic intervention in order to improve the following deficits and impairments:  Pain, Impaired UE functional use, Decreased strength, Decreased range of motion, Hypomobility  Visit Diagnosis: Incomplete tear of left rotator cuff, unspecified whether traumatic  Chronic left shoulder pain  Stiffness of left shoulder, not elsewhere classified  Muscle weakness (generalized)     Problem List Patient Active Problem List   Diagnosis Date Noted  . Left carpal tunnel syndrome 12/27/2019  . Adhesive capsulitis of left shoulder 12/27/2019  . Tendinopathy of left biceps 12/27/2019  . Hyperlipidemia with target LDL less than 100 06/23/2012  . Family history of heart disease in male family member before age 6 06/23/2012  . Current smoker 06/23/2012    Lazarus Gowda, PT, DPT 03/01/20 11:50 AM  [facility] [address] [place], [place], [address] Phone: [phone]   Fax:  [phone]  Name: Oscar Myers MRN: [id number] Date of Birth: 01-Jul-1955

## 2020-03-05 ENCOUNTER — Encounter: Payer: Self-pay | Admitting: Physical Therapy

## 2020-03-05 ENCOUNTER — Ambulatory Visit: Payer: No Typology Code available for payment source | Attending: Physician Assistant | Admitting: Physical Therapy

## 2020-03-05 ENCOUNTER — Other Ambulatory Visit: Payer: Self-pay

## 2020-03-05 DIAGNOSIS — M25612 Stiffness of left shoulder, not elsewhere classified: Secondary | ICD-10-CM | POA: Diagnosis present

## 2020-03-05 DIAGNOSIS — M75112 Incomplete rotator cuff tear or rupture of left shoulder, not specified as traumatic: Secondary | ICD-10-CM

## 2020-03-05 DIAGNOSIS — M25512 Pain in left shoulder: Secondary | ICD-10-CM | POA: Insufficient documentation

## 2020-03-05 DIAGNOSIS — G8929 Other chronic pain: Secondary | ICD-10-CM | POA: Diagnosis present

## 2020-03-05 DIAGNOSIS — M6281 Muscle weakness (generalized): Secondary | ICD-10-CM | POA: Diagnosis present

## 2020-03-05 NOTE — Therapy (Signed)
[facility] Outpatient Rehabilitation [facility] [address] [place], [place], [address] Phone: [phone]   Fax:  [phone]  Physical Therapy Treatment  Patient Details  Name: Oscar Myers MRN: [id number] Date of Birth: 15-Mar-1956 Referring Provider (PT): Jari Sportsman, PA-C (surgery by Gershon Mussel, MD)   Encounter Date: 03/05/2020   PT End of Session - 03/05/20 1521    Visit Number 9    Number of Visits 15    Date for PT Re-Evaluation 03/28/20    Authorization Type VA    Authorization Time Period 11/03/19-05/01/20    Authorization - Visit Number 9    Authorization - Number of Visits 15    PT Start Time 1453    PT Stop Time 1533    PT Time Calculation (min) 40 min    Activity Tolerance Patient limited by pain    Behavior During Therapy [facility] for tasks assessed/performed           Past Medical History:  Diagnosis Date  . CTS (carpal tunnel syndrome)   . Dyslipidemia     Past Surgical History:  Procedure Laterality Date  . BACK SURGERY    . CARPAL TUNNEL RELEASE Left 04/30/2016   Procedure: LEFT CARPAL TUNNEL RELEASE ENDOSCOPIC;  Surgeon: Sheral Apley, MD;  Location: [facility];  Service: Orthopedics;  Laterality: Left;  . CARPAL TUNNEL RELEASE Left 01/10/2020   Procedure: LEFT CARPAL TUNNEL RELEASE;  Surgeon: Tarry Kos, MD;  Location: [facility];  Service: Orthopedics;  Laterality: Left;  . COLONOSCOPY  2012  . SHOULDER ARTHROSCOPY WITH ROTATOR CUFF REPAIR AND SUBACROMIAL DECOMPRESSION Left 01/10/2020   Procedure: LEFT SHOULDER ARTHROSCOPY WITH EXTENSIVE DEBRIDEMENT, SUBACROMIAL DECOMPRESSION, DISTAL CLAVICLE EXCISION, MANIPULATION UNDER ANESTHESIA;  Surgeon: Tarry Kos, MD;  Location: [facility];  Service: Orthopedics;  Laterality: Left;    There were no vitals filed for this visit.   Subjective Assessment - 03/05/20 1535    Subjective Not as sore today with left shoulder pain down to a 5/10 but  has limited activity over the weekend.              [facility] PT Assessment - 03/05/20 0001      AROM   Left Shoulder Flexion 95 Degrees    Left Shoulder ABduction 80 Degrees    Left Shoulder Internal Rotation --   reach to sacrum   Left Shoulder External Rotation --   reahc to left upper trapezius region                        [facility] Adult PT Treatment/Exercise - 03/05/20 0001      Shoulder Exercises: Supine   Flexion Limitations short arc flexion punch x 15 reps      Shoulder Exercises: Sidelying   External Rotation AROM;Left;20 reps    ABduction Limitations left shoulder scaption AROM x 15 reps      Shoulder Exercises: Standing   Internal Rotation AROM;Strengthening;Left;20 reps    Theraband Level (Shoulder Internal Rotation) Level 2 (Red)    Extension AROM;Strengthening;Both;20 reps    Theraband Level (Shoulder Extension) Level 2 (Red)    Row AROM;Strengthening;Both;20 reps    Theraband Level (Shoulder Row) Level 3 (Green)      Shoulder Exercises: Pulleys   Flexion 2 minutes    Scaption Limitations 1.5 min       Shoulder Exercises: Isometric Strengthening   Flexion Limitations 15 reps x 3-5 sec holds  ABduction Limitations 15 reps x 3-5 sec holds      Manual Therapy   Joint Mobilization left GH mobilization distraction and  AP and caudal glides grade I-III    Soft tissue mobilization left subscapularis release    Passive ROM Left shoulder 4-way PROM                    PT Short Term Goals - 02/01/20 1535      PT SHORT TERM GOAL #1   Title Independent with initial HEP    Baseline instructed at eval    Time 4    Period Weeks    Status New      PT SHORT TERM GOAL #2   Title Increase left shoulder AROM to at least 90 deg or greater to work towards improved reaching ability for ADLs such as grooming    Baseline 40 deg AROM, 30 deg PROM limited by pain and muscle guarding    Time 4    Period Weeks    Status New             PT  Long Term Goals - 02/01/20 1536      PT LONG TERM GOAL #1   Title Improve FOTO outcome measure score to 37% or less impairment    Baseline 60% limited    Time 8    Period Weeks    Status New    Target Date 03/28/20      PT LONG TERM GOAL #2   Title Left shoulder AROM grossly WFL to dress and bathe with left UE use    Baseline left shoulder ROM grossly limited    Time 8    Period Weeks    Status New    Target Date 03/28/20      PT LONG TERM GOAL #3   Title Left shoulder strength grossly 5/5 to improve ability for lifting activities and to assist return to work pending MD clearance    Baseline MMTs not tested at eval    Time 8    Period Weeks    Status New    Target Date 03/28/20      PT LONG TERM GOAL #4   Title Perform reaching ADLs including dressing, bathing, chores as needed with left shoulder pain 3/10 or less    Baseline 7/10    Time 8    Period Weeks    Status New    Target Date 03/28/20                 Plan - 03/05/20 1535    Clinical Impression Statement Left shoulder AROM/PROM still limited with stiffness and muscle weakness but making gradual progress with ROM gains from previous status. ROM focus with PROM/manual but continued work on progressing AROM and strengthening within tolerated planes of motion with fair tolerance due to soreness.    Personal Factors and Comorbidities Other    Examination-Activity Limitations Lift;Hygiene/Grooming;Bathing;Dressing;Sleep;Bed Mobility;Carry;Reach Overhead    Examination-Participation Restrictions Driving;Meal Prep;Laundry;Cleaning;Occupation    Stability/Clinical Decision Making Stable/Uncomplicated    Clinical Decision Making Low    Rehab Potential Good    PT Frequency --   2-3x/week   PT Duration 8 weeks    PT Treatment/Interventions ADLs/Self Care Home Management;Cryotherapy;Electrical Stimulation;Moist Heat;Therapeutic activities;Therapeutic exercise;Patient/family education;Manual techniques;Passive range of  motion;Dry needling;Neuromuscular re-education;Taping;Vasopneumatic Device    PT Next Visit Plan Recheck FOTO next session, continue ROM emphasis, progress shoulder AROM and strengthening as tolerated pending pain    PT  Home Exercise Plan Access code: [email or web address]    Consulted and Agree with Plan of Care Patient           Patient will benefit from skilled therapeutic intervention in order to improve the following deficits and impairments:  Pain, Impaired UE functional use, Decreased strength, Decreased range of motion, Hypomobility  Visit Diagnosis: Incomplete tear of left rotator cuff, unspecified whether traumatic  Chronic left shoulder pain  Stiffness of left shoulder, not elsewhere classified  Muscle weakness (generalized)     Problem List Patient Active Problem List   Diagnosis Date Noted  . Left carpal tunnel syndrome 12/27/2019  . Adhesive capsulitis of left shoulder 12/27/2019  . Tendinopathy of left biceps 12/27/2019  . Hyperlipidemia with target LDL less than 100 06/23/2012  . Family history of heart disease in male family member before age 91 06/23/2012  . Current smoker 06/23/2012    Lazarus Gowda, PT, DPT 03/05/20 3:38 PM  [facility] [address] [place], [place], [address] Phone: [phone]   Fax:  [phone]  Name: Oscar Myers MRN: [id number] Date of Birth: [birth year]

## 2020-03-07 ENCOUNTER — Other Ambulatory Visit: Payer: Self-pay

## 2020-03-07 ENCOUNTER — Ambulatory Visit: Payer: No Typology Code available for payment source | Admitting: Physical Therapy

## 2020-03-07 ENCOUNTER — Encounter: Payer: Self-pay | Admitting: Physical Therapy

## 2020-03-07 DIAGNOSIS — M75112 Incomplete rotator cuff tear or rupture of left shoulder, not specified as traumatic: Secondary | ICD-10-CM

## 2020-03-07 DIAGNOSIS — M25612 Stiffness of left shoulder, not elsewhere classified: Secondary | ICD-10-CM

## 2020-03-07 DIAGNOSIS — M6281 Muscle weakness (generalized): Secondary | ICD-10-CM

## 2020-03-07 DIAGNOSIS — M25512 Pain in left shoulder: Secondary | ICD-10-CM

## 2020-03-07 DIAGNOSIS — G8929 Other chronic pain: Secondary | ICD-10-CM

## 2020-03-07 NOTE — Therapy (Signed)
[facility] Outpatient Rehabilitation [facility] [address] [place], [place], [address] Phone: [phone]   Fax:  [phone]  Physical Therapy Treatment  Patient Details  Name: Oscar Myers MRN: [id number] Date of Birth: Sep 05, 1955 Referring Provider (PT): Jari Sportsman, PA-C (surgery by Gershon Mussel, MD)   Encounter Date: 03/07/2020   PT End of Session - 03/07/20 1532    Visit Number 10    Number of Visits 15    Date for PT Re-Evaluation 03/28/20    Authorization Type [facility]    Authorization Time Period 11/03/19-05/01/20    Authorization - Visit Number 10    Authorization - Number of Visits 15    PT Start Time 1457    PT Stop Time 1539    PT Time Calculation (min) 42 min    Activity Tolerance Patient tolerated treatment well    Behavior During Therapy [facility] for tasks assessed/performed           Past Medical History:  Diagnosis Date  . CTS (carpal tunnel syndrome)   . Dyslipidemia     Past Surgical History:  Procedure Laterality Date  . BACK SURGERY    . CARPAL TUNNEL RELEASE Left 04/30/2016   Procedure: LEFT CARPAL TUNNEL RELEASE ENDOSCOPIC;  Surgeon: Sheral Apley, MD;  Location: [facility];  Service: Orthopedics;  Laterality: Left;  . CARPAL TUNNEL RELEASE Left 01/10/2020   Procedure: LEFT CARPAL TUNNEL RELEASE;  Surgeon: Tarry Kos, MD;  Location: [facility];  Service: Orthopedics;  Laterality: Left;  . COLONOSCOPY  2012  . SHOULDER ARTHROSCOPY WITH ROTATOR CUFF REPAIR AND SUBACROMIAL DECOMPRESSION Left 01/10/2020   Procedure: LEFT SHOULDER ARTHROSCOPY WITH EXTENSIVE DEBRIDEMENT, SUBACROMIAL DECOMPRESSION, DISTAL CLAVICLE EXCISION, MANIPULATION UNDER ANESTHESIA;  Surgeon: Tarry Kos, MD;  Location: [facility];  Service: Orthopedics;  Laterality: Left;    There were no vitals filed for this visit.   Subjective Assessment - 03/07/20 1500    Subjective No new complaints/concerns since last visit.     Currently in Pain? Yes    Pain Score 6     Pain Location Shoulder    Pain Orientation Left    Pain Descriptors / Indicators Aching    Pain Type Acute pain    Pain Onset More than a month ago    Pain Frequency Constant    Aggravating Factors  reaching activities, rest    Pain Relieving Factors ice and medication    Effect of Pain on Daily Activities limits ability for reaching ADLs              [facility] PT Assessment - 03/07/20 0001      Observation/Other Assessments   Focus on Therapeutic Outcomes (FOTO)  53% limited                         [facility] Adult PT Treatment/Exercise - 03/07/20 0001      Shoulder Exercises: Supine   Flexion Limitations short arc flexion punch with 1 lbs. 2x10, AROM with extended elbow x 10 reps no weight      Shoulder Exercises: Prone   Extension AROM;Strengthening;Left;20 reps    Extension Weight (lbs) 1    Other Prone Exercises row 2 lbs. 2x10      Shoulder Exercises: Sidelying   External Rotation AROM;Left;20 reps    External Rotation Weight (lbs) 1    ABduction Limitations left shoulder scaption AROM x 15 reps  Shoulder Exercises: Standing   External Rotation AROM;Strengthening;Left;15 reps    Theraband Level (Shoulder External Rotation) Level 1 (Yellow)    Internal Rotation AROM;Strengthening;Left;20 reps    Theraband Level (Shoulder Internal Rotation) Level 2 (Red)    Extension AROM;Strengthening;Both;20 reps    Theraband Level (Shoulder Extension) Level 2 (Red)    Row AROM;Strengthening;Both;20 reps    Theraband Level (Shoulder Row) Level 3 (Green)    Other Standing Exercises UE ranger AAROM left shoulder flexoin abd scaption x20 ea.      Shoulder Exercises: Pulleys   Flexion 2 minutes    Scaption 2 minutes      Manual Therapy   Joint Mobilization left GH mobilization distraction and  AP and caudal glides grade I-III    Passive ROM Left shoulder 4-way PROM                    PT Short Term Goals -  02/01/20 1535      PT SHORT TERM GOAL #1   Title Independent with initial HEP    Baseline instructed at eval    Time 4    Period Weeks    Status New      PT SHORT TERM GOAL #2   Title Increase left shoulder AROM to at least 90 deg or greater to work towards improved reaching ability for ADLs such as grooming    Baseline 40 deg AROM, 30 deg PROM limited by pain and muscle guarding    Time 4    Period Weeks    Status New             PT Long Term Goals - 03/07/20 1527      PT LONG TERM GOAL #1   Title Improve FOTO outcome measure score to 37% or less impairment    Baseline 53% limited    Time 8    Period Weeks    Status On-going      PT LONG TERM GOAL #2   Title Left shoulder AROM grossly WFL to dress and bathe with left UE use    Baseline ongoing    Time 8    Period Weeks    Status On-going      PT LONG TERM GOAL #3   Title Left shoulder strength grossly 5/5 to improve ability for lifting activities and to assist return to work pending MD clearance    Baseline ongoing    Time 8    Period Weeks    Status On-going      PT LONG TERM GOAL #4   Title Perform reaching ADLs including dressing, bathing, chores as needed with left shoulder pain 3/10 or less    Baseline 6/10    Time 8    Period Weeks    Status On-going                 Plan - 03/07/20 1534    Clinical Impression Statement Still with moderate to high levek of functional limitation per reassessment of FOTO today but notable decrease in shoulder tightness and able to continue exercise progression for strengthening as noted per flowsheet. Pt. would benefit from continued PT for further progress to address remaining functional limitations for reaching ADLs.    Personal Factors and Comorbidities Other    Examination-Activity Limitations Lift;Hygiene/Grooming;Bathing;Dressing;Sleep;Bed Mobility;Carry;Reach Overhead    Examination-Participation Restrictions Driving;Meal Prep;Laundry;Cleaning;Occupation     Stability/Clinical Decision Making Stable/Uncomplicated    Clinical Decision Making Low    Rehab Potential Good  PT Frequency --   2-3x/week   PT Duration 8 weeks    PT Treatment/Interventions ADLs/Self Care Home Management;Cryotherapy;Electrical Stimulation;Moist Heat;Therapeutic activities;Therapeutic exercise;Patient/family education;Manual techniques;Passive range of motion;Dry needling;Neuromuscular re-education;Taping;Vasopneumatic Device    PT Next Visit Plan continue ROM emphasis, progress shoulder AROM and strengthening as tolerated pending pain    PT Home Exercise Plan Access code: [email or web address]    Consulted and Agree with Plan of Care Patient           Patient will benefit from skilled therapeutic intervention in order to improve the following deficits and impairments:  Pain, Impaired UE functional use, Decreased strength, Decreased range of motion, Hypomobility  Visit Diagnosis: Incomplete tear of left rotator cuff, unspecified whether traumatic  Chronic left shoulder pain  Stiffness of left shoulder, not elsewhere classified  Muscle weakness (generalized)     Problem List Patient Active Problem List   Diagnosis Date Noted  . Left carpal tunnel syndrome 12/27/2019  . Adhesive capsulitis of left shoulder 12/27/2019  . Tendinopathy of left biceps 12/27/2019  . Hyperlipidemia with target LDL less than 100 06/23/2012  . Family history of heart disease in male family member before age 75 06/23/2012  . Current smoker 06/23/2012    Lazarus Gowda, PT, DPT 03/07/20 3:41 PM  [facility] Outpatient Rehabilitation [facility] [address] [place], [place], [address] Phone: [phone]   Fax:  [phone]  Name: Oscar Myers MRN: [id number] Date of Birth: [birth year]

## 2020-03-12 ENCOUNTER — Encounter: Payer: Self-pay | Admitting: Physical Therapy

## 2020-03-12 ENCOUNTER — Other Ambulatory Visit: Payer: Self-pay

## 2020-03-12 ENCOUNTER — Ambulatory Visit: Payer: No Typology Code available for payment source | Admitting: Physical Therapy

## 2020-03-12 DIAGNOSIS — M75112 Incomplete rotator cuff tear or rupture of left shoulder, not specified as traumatic: Secondary | ICD-10-CM | POA: Diagnosis not present

## 2020-03-12 DIAGNOSIS — M6281 Muscle weakness (generalized): Secondary | ICD-10-CM

## 2020-03-12 DIAGNOSIS — G8929 Other chronic pain: Secondary | ICD-10-CM

## 2020-03-12 DIAGNOSIS — M25612 Stiffness of left shoulder, not elsewhere classified: Secondary | ICD-10-CM

## 2020-03-12 DIAGNOSIS — M25512 Pain in left shoulder: Secondary | ICD-10-CM

## 2020-03-12 NOTE — Therapy (Signed)
[facility] [address] [place], [place], [address] Phone: [phone]   Fax:  [phone]  Physical Therapy Treatment  Patient Details  Name: Oscar Myers MRN: [id number] Date of Birth: 03/25/1956 Referring Provider (PT): Jari Sportsman, PA-C (surgery by Gershon Mussel, MD)   Encounter Date: 03/12/2020   PT End of Session - 03/12/20 1546    Visit Number 11    Number of Visits 15    Date for PT Re-Evaluation 03/28/20    Authorization Type [place]    Authorization Time Period 11/03/19-05/01/20    Authorization - Visit Number 11    Authorization - Number of Visits 15    PT Start Time 1500    PT Stop Time 1540    PT Time Calculation (min) 40 min    Activity Tolerance Patient limited by pain    Behavior During Therapy [facility] for tasks assessed/performed           Past Medical History:  Diagnosis Date  . CTS (carpal tunnel syndrome)   . Dyslipidemia     Past Surgical History:  Procedure Laterality Date  . BACK SURGERY    . CARPAL TUNNEL RELEASE Left 04/30/2016   Procedure: LEFT CARPAL TUNNEL RELEASE ENDOSCOPIC;  Surgeon: Sheral Apley, MD;  Location: [facility];  Service: Orthopedics;  Laterality: Left;  . CARPAL TUNNEL RELEASE Left 01/10/2020   Procedure: LEFT CARPAL TUNNEL RELEASE;  Surgeon: Tarry Kos, MD;  Location: [facility];  Service: Orthopedics;  Laterality: Left;  . COLONOSCOPY  2012  . SHOULDER ARTHROSCOPY WITH ROTATOR CUFF REPAIR AND SUBACROMIAL DECOMPRESSION Left 01/10/2020   Procedure: LEFT SHOULDER ARTHROSCOPY WITH EXTENSIVE DEBRIDEMENT, SUBACROMIAL DECOMPRESSION, DISTAL CLAVICLE EXCISION, MANIPULATION UNDER ANESTHESIA;  Surgeon: Tarry Kos, MD;  Location:  [facility];  Service: Orthopedics;  Laterality: Left;    There were no vitals filed for this visit.   Subjective Assessment - 03/12/20 1505    Subjective Left shoulder more sore today-pain 7/10 pain this PM. 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    Pertinent History remote history of prevoius left shoulder surgery in the late 1970s, pt. also has right shoulder pain issues as well    Currently in Pain? Yes    Pain Score 7     Pain Location Shoulder    Pain Orientation Left    Pain Descriptors / Indicators Aching    Pain Type Acute pain    Pain Onset More than a month ago    Pain Frequency Constant    Aggravating Factors  reaching, activity    Pain Relieving Factors ice and medication    Effect of Pain on Daily Activities limits ability for reaching ADLs                             [facility] Adult PT Treatment/Exercise - 03/12/20 0001      Shoulder Exercises: Supine   Flexion Limitations wand AAROM chest press 2x10    Other Supine Exercises supine wand ER 2x10      Shoulder Exercises: Sidelying   External Rotation AROM;Left;20 reps      Shoulder Exercises: Standing   Extension AROM;Strengthening;Both;20 reps    Theraband Level (Shoulder Extension) Level 2 (Red)    Row AROM;Strengthening;Both;20 reps    Theraband Level (Shoulder Row) Level 2 (Red)    Other Standing Exercises UE ranger AAROM flexion 2x10  Shoulder Exercises: Pulleys   Flexion 2 minutes    Scaption 2 minutes      Manual Therapy   Joint Mobilization left GH mobilization distraction and  AP and caudal glides grade I-III    Soft tissue mobilization left posterior rotator cuff and subscapularis    Passive ROM Left shoulder 4-way PROM                    PT Short Term Goals - 02/01/20 1535      PT SHORT TERM GOAL #1   Title Independent with initial HEP    Baseline instructed at eval    Time 4    Period Weeks    Status New      PT SHORT TERM GOAL #2   Title Increase left shoulder AROM to at least 90 deg or greater to work towards improved reaching ability for ADLs such as grooming    Baseline 40 deg AROM, 30 deg PROM limited by pain and muscle  guarding    Time 4    Period Weeks    Status New             PT Long Term Goals - 03/07/20 1527      PT LONG TERM GOAL #1   Title Improve FOTO outcome measure score to 37% or less impairment    Baseline 53% limited    Time 8    Period Weeks    Status On-going      PT LONG TERM GOAL #2   Title Left shoulder AROM grossly WFL to dress and bathe with left UE use    Baseline ongoing    Time 8    Period Weeks    Status On-going      PT LONG TERM GOAL #3   Title Left shoulder strength grossly 5/5 to improve ability for lifting activities and to assist return to work pending MD clearance    Baseline ongoing    Time 8    Period Weeks    Status On-going      PT LONG TERM GOAL #4   Title Perform reaching ADLs including dressing, bathing, chores as needed with left shoulder pain 3/10 or less    Baseline 6/10    Time 8    Period Weeks    Status On-going                 Plan - 03/12/20 1547    Clinical Impression Statement Backed off of tx. progression given higer pain level with more focus manual therapy and stretching to address shoulder stiffness. Fair progress with therapy with pain as limiting factor on therapy/protocol progression.    Personal Factors and Comorbidities Other    Examination-Activity Limitations Lift;Hygiene/Grooming;Bathing;Dressing;Sleep;Bed Mobility;Carry;Reach Overhead    Examination-Participation Restrictions Driving;Meal Prep;Laundry;Cleaning;Occupation    Stability/Clinical Decision Making Stable/Uncomplicated    Clinical Decision Making Low    Rehab Potential Good    PT Frequency --   2-3x/week   PT Duration 8 weeks    PT Treatment/Interventions ADLs/Self Care Home Management;Cryotherapy;Electrical Stimulation;Moist Heat;Therapeutic activities;Therapeutic exercise;Patient/family education;Manual techniques;Passive range of motion;Dry needling;Neuromuscular re-education;Taping;Vasopneumatic Device    PT Next Visit Plan continue ROM emphasis,  progress shoulder AROM and strengthening as tolerated pending pain    PT Home Exercise Plan Access code: [email or web address]    Consulted and Agree with Plan of Care Patient           Patient will benefit from skilled therapeutic intervention in order to improve  the following deficits and impairments:  Pain, Impaired UE functional use, Decreased strength, Decreased range of motion, Hypomobility  Visit Diagnosis: Incomplete tear of left rotator cuff, unspecified whether traumatic  Chronic left shoulder pain  Stiffness of left shoulder, not elsewhere classified  Muscle weakness (generalized)     Problem List Patient Active Problem List   Diagnosis Date Noted  . Left carpal tunnel syndrome 12/27/2019  . Adhesive capsulitis of left shoulder 12/27/2019  . Tendinopathy of left biceps 12/27/2019  . Hyperlipidemia with target LDL less than 100 06/23/2012  . Family history of heart disease in male family member before age 75 06/23/2012  . Current smoker 06/23/2012    Lazarus Gowda, PT, DPT 03/12/20 3:50 PM  [facility] [address] [place], [place], [address] Phone: [phone]   Fax:  [phone]  Name: Oscar Myers MRN: [id number] Date of Birth: Dec 05, 1955

## 2020-03-14 ENCOUNTER — Encounter: Payer: Self-pay | Admitting: Physical Therapy

## 2020-03-14 ENCOUNTER — Other Ambulatory Visit: Payer: Self-pay

## 2020-03-14 ENCOUNTER — Ambulatory Visit: Payer: No Typology Code available for payment source | Admitting: Physical Therapy

## 2020-03-14 DIAGNOSIS — M25512 Pain in left shoulder: Secondary | ICD-10-CM

## 2020-03-14 DIAGNOSIS — M25612 Stiffness of left shoulder, not elsewhere classified: Secondary | ICD-10-CM

## 2020-03-14 DIAGNOSIS — M6281 Muscle weakness (generalized): Secondary | ICD-10-CM

## 2020-03-14 DIAGNOSIS — M75112 Incomplete rotator cuff tear or rupture of left shoulder, not specified as traumatic: Secondary | ICD-10-CM

## 2020-03-14 DIAGNOSIS — G8929 Other chronic pain: Secondary | ICD-10-CM

## 2020-03-14 NOTE — Therapy (Signed)
[facility] [address] [place], [place], [address] Phone: [phone]   Fax:  [phone]  Physical Therapy Treatment  Patient Details  Name: Oscar Myers MRN: [id number] Date of Birth: 28-Jun-1955 Referring Provider (PT): Jari Sportsman, PA-C (surgery by Gershon Mussel, MD)   Encounter Date: 03/14/2020   PT End of Session - 03/14/20 1557    Visit Number 12    Number of Visits 15    Date for PT Re-Evaluation 03/28/20    Authorization Type VA    Authorization Time Period 11/03/19-05/01/20    Authorization - Visit Number 12    Authorization - Number of Visits 15    PT Start Time 1455    PT Stop Time 1542    PT Time Calculation (min) 47 min    Activity Tolerance Patient limited by pain    Behavior During Therapy [facility] for tasks assessed/performed           Past Medical History:  Diagnosis Date  . CTS (carpal tunnel syndrome)   . Dyslipidemia     Past Surgical History:  Procedure Laterality Date  . BACK SURGERY    . CARPAL TUNNEL RELEASE Left 04/30/2016   Procedure: LEFT CARPAL TUNNEL RELEASE ENDOSCOPIC;  Surgeon: Sheral Apley, MD;  Location: [facility];  Service: Orthopedics;  Laterality: Left;  . CARPAL TUNNEL RELEASE Left 01/10/2020   Procedure: LEFT CARPAL TUNNEL RELEASE;  Surgeon: Tarry Kos, MD;  Location: [facility];  Service: Orthopedics;  Laterality: Left;  . COLONOSCOPY  2012  . SHOULDER ARTHROSCOPY WITH ROTATOR CUFF REPAIR AND SUBACROMIAL DECOMPRESSION Left 01/10/2020   Procedure: LEFT SHOULDER ARTHROSCOPY WITH EXTENSIVE DEBRIDEMENT, SUBACROMIAL DECOMPRESSION, DISTAL CLAVICLE EXCISION, MANIPULATION UNDER ANESTHESIA;  Surgeon: Tarry Kos, MD;  Location: [facility];  Service: Orthopedics;  Laterality: Left;    There were no vitals filed for this visit.   Subjective Assessment - 03/14/20 1549    Subjective Pt. reports shoulder feels about the same as last  visit-still with pain and tightness (see objective measures) limited reaching ability with left arm. See assessment/plan.              [facility] PT Assessment - 03/14/20 0001      AROM   Left Shoulder Flexion 95 Degrees    Left Shoulder ABduction 80 Degrees    Left Shoulder Internal Rotation --   reach to sacrum   Left Shoulder External Rotation --   reach to upper trapezius region                        [facility] Adult PT Treatment/Exercise - 03/14/20 0001      Shoulder Exercises: Supine   Flexion AROM;Strengthening;Left;20 reps    Shoulder Flexion Weight (lbs) 1      Shoulder Exercises: Sidelying   External Rotation AROM;Left;20 reps    External Rotation Weight (lbs) 1    ABduction AROM;Strengthening;Left;20 reps    ABduction Weight (lbs) 1    ABduction Limitations scaption      Shoulder Exercises: Standing   External Rotation AROM;Strengthening;Left;20 reps    Theraband Level (Shoulder External Rotation) Level 2 (Red)    Internal Rotation AROM;Strengthening;Left;20 reps    Theraband Level (Shoulder Internal Rotation) Level 2 (Red)    Extension AROM;Strengthening;Both;20 reps    Theraband Level (Shoulder Extension) Level 3 (Green)    Row AROM;Strengthening;Both;20 reps    Theraband Level (Shoulder Row) Level 3 (Green)  Shoulder Exercises: Stretch   Cross Chest Stretch 3 reps;20 seconds    Other Shoulder Stretches sleeper stretch 20 sec x 3 left side      Manual Therapy   Joint Mobilization left GH mobilization distraction and  AP and caudal glides grade I-III    Soft tissue mobilization left posterior rotator cuff and subscapularis    Passive ROM Left shoulder 4-way PROM                  PT Education - 03/14/20 1555    Education Details HEP updates, POC, symptom etiology    Person(s) Educated Patient    Methods Explanation;Demonstration;Verbal cues;Handout    Comprehension Verbalized understanding;Returned demonstration            PT  Short Term Goals - 02/01/20 1535      PT SHORT TERM GOAL #1   Title Independent with initial HEP    Baseline instructed at eval    Time 4    Period Weeks    Status New      PT SHORT TERM GOAL #2   Title Increase left shoulder AROM to at least 90 deg or greater to work towards improved reaching ability for ADLs such as grooming    Baseline 40 deg AROM, 30 deg PROM limited by pain and muscle guarding    Time 4    Period Weeks    Status New             PT Long Term Goals - 03/07/20 1527      PT LONG TERM GOAL #1   Title Improve FOTO outcome measure score to 37% or less impairment    Baseline 53% limited    Time 8    Period Weeks    Status On-going      PT LONG TERM GOAL #2   Title Left shoulder AROM grossly WFL to dress and bathe with left UE use    Baseline ongoing    Time 8    Period Weeks    Status On-going      PT LONG TERM GOAL #3   Title Left shoulder strength grossly 5/5 to improve ability for lifting activities and to assist return to work pending MD clearance    Baseline ongoing    Time 8    Period Weeks    Status On-going      PT LONG TERM GOAL #4   Title Perform reaching ADLs including dressing, bathing, chores as needed with left shoulder pain 3/10 or less    Baseline 6/10    Time 8    Period Weeks    Status On-going                 Plan - 03/14/20 1515    Clinical Impression Statement Fair progress with therapy with continued shoulder pain and stiffness. Continued ROM emphasis with stretching/PROM and manual tx. to help address stiffness. For exercises more focus strengthening progression today to help promote tendon healing and remodeling given underlying tendinopathy. Pt. has beeen able to progresss strengthening exercises but pain has been a prohibiting factor for progression with therapy goals and objective status. Plan continue PT for further progress to help improve functional stauts for reaching ADLs.    Personal Factors and Comorbidities  Other    Examination-Activity Limitations Lift;Hygiene/Grooming;Bathing;Dressing;Sleep;Bed Mobility;Carry;Reach Overhead    Examination-Participation Restrictions Driving;Meal Prep;Laundry;Cleaning;Occupation    Stability/Clinical Decision Making Stable/Uncomplicated    Clinical Decision Making Low    Rehab Potential  Good    PT Frequency --   2-3x/week   PT Duration 8 weeks    PT Treatment/Interventions ADLs/Self Care Home Management;Cryotherapy;Electrical Stimulation;Moist Heat;Therapeutic activities;Therapeutic exercise;Patient/family education;Manual techniques;Passive range of motion;Dry needling;Neuromuscular re-education;Taping;Vasopneumatic Device    PT Next Visit Plan continue ROM emphasis, progress shoulder AROM and strengthening as tolerated pending pain    PT Home Exercise Plan Access code: [email or web address]    Consulted and Agree with Plan of Care Patient           Patient will benefit from skilled therapeutic intervention in order to improve the following deficits and impairments:  Pain, Impaired UE functional use, Decreased strength, Decreased range of motion, Hypomobility  Visit Diagnosis: Incomplete tear of left rotator cuff, unspecified whether traumatic  Chronic left shoulder pain  Stiffness of left shoulder, not elsewhere classified  Muscle weakness (generalized)     Problem List Patient Active Problem List   Diagnosis Date Noted  . Left carpal tunnel syndrome 12/27/2019  . Adhesive capsulitis of left shoulder 12/27/2019  . Tendinopathy of left biceps 12/27/2019  . Hyperlipidemia with target LDL less than 100 06/23/2012  . Family history of heart disease in male family member before age 85 06/23/2012  . Current smoker 06/23/2012    Lazarus Gowda, PT, DPT 03/14/20 5:12 PM  [facility] [address] [place], [place], [address] Phone: [phone]   Fax:  [phone]  Name: Oscar Myers MRN: [id number]  Date of Birth: 03-26-1956

## 2020-03-21 ENCOUNTER — Encounter: Payer: Self-pay | Admitting: Physical Therapy

## 2020-03-21 ENCOUNTER — Other Ambulatory Visit: Payer: Self-pay

## 2020-03-21 ENCOUNTER — Ambulatory Visit: Payer: No Typology Code available for payment source | Admitting: Physical Therapy

## 2020-03-21 DIAGNOSIS — M75112 Incomplete rotator cuff tear or rupture of left shoulder, not specified as traumatic: Secondary | ICD-10-CM | POA: Diagnosis not present

## 2020-03-21 DIAGNOSIS — M6281 Muscle weakness (generalized): Secondary | ICD-10-CM

## 2020-03-21 DIAGNOSIS — M25612 Stiffness of left shoulder, not elsewhere classified: Secondary | ICD-10-CM

## 2020-03-21 DIAGNOSIS — M25512 Pain in left shoulder: Secondary | ICD-10-CM

## 2020-03-21 DIAGNOSIS — G8929 Other chronic pain: Secondary | ICD-10-CM

## 2020-03-21 NOTE — Therapy (Signed)
[facility] [address] [place], [place], [address] Phone: [phone]   Fax:  [phone]  Physical Therapy Treatment  Patient Details  Name: Oscar Myers MRN: [id number] Date of Birth: [birth year] Referring Provider (PT): Jari Sportsman, PA-C (surgery by Gershon Mussel, MD)   Encounter Date: 03/21/2020   PT End of Session - 03/21/20 1111    Visit Number 13    Number of Visits 15    Authorization Type [facility]    Authorization Time Period 11/03/19-05/01/20    Authorization - Visit Number 13    Authorization - Number of Visits 15    PT Start Time 1057    PT Stop Time 1142    PT Time Calculation (min) 45 min    Activity Tolerance Patient tolerated treatment well    Behavior During Therapy [facility] for tasks assessed/performed           Past Medical History:  Diagnosis Date  . CTS (carpal tunnel syndrome)   . Dyslipidemia     Past Surgical History:  Procedure Laterality Date  . BACK SURGERY    . CARPAL TUNNEL RELEASE Left 04/30/2016   Procedure: LEFT CARPAL TUNNEL RELEASE ENDOSCOPIC;  Surgeon: Sheral Apley, MD;  Location: [facility];  Service: Orthopedics;  Laterality: Left;  . CARPAL TUNNEL RELEASE Left 01/10/2020   Procedure: LEFT CARPAL TUNNEL RELEASE;  Surgeon: Tarry Kos, MD;  Location: [facility];  Service: Orthopedics;  Laterality: Left;  . COLONOSCOPY  2012  . SHOULDER ARTHROSCOPY WITH ROTATOR CUFF REPAIR AND SUBACROMIAL DECOMPRESSION Left 01/10/2020   Procedure: LEFT SHOULDER ARTHROSCOPY WITH EXTENSIVE DEBRIDEMENT, SUBACROMIAL DECOMPRESSION, DISTAL CLAVICLE EXCISION, MANIPULATION UNDER ANESTHESIA;  Surgeon: Tarry Kos, MD;  Location: [facility];  Service: Orthopedics;  Laterality: Left;    There were no vitals filed for this visit.   Subjective Assessment - 03/21/20 1109    Subjective Left shoulder pain not too bad this AM compated with previous status. Still, however,  with moderate pain 5/10.    Currently in Pain? Yes    Pain Score 5     Pain Location Shoulder    Pain Orientation Left    Pain Descriptors / Indicators Aching    Pain Type Acute pain    Pain Onset More than a month ago    Pain Frequency Constant    Aggravating Factors  reaching, activity    Pain Relieving Factors ice and medication    Effect of Pain on Daily Activities limits ability for reaching ADLs              [facility] PT Assessment - 03/21/20 0001      AROM   Left Shoulder Flexion 110 Degrees    Left Shoulder ABduction 85 Degrees    Left Shoulder Internal Rotation --   reach to sacrum   Left Shoulder External Rotation --   reach to upper cervicals                        [facility] Adult PT Treatment/Exercise - 03/21/20 0001      Shoulder Exercises: Supine   Flexion AROM;Strengthening;Left;20 reps    Shoulder Flexion Weight (lbs) 1      Shoulder Exercises: Sidelying   External Rotation AROM;Left;20 reps    External Rotation Weight (lbs) 1    ABduction AROM;Strengthening;Left;20 reps    ABduction Weight (lbs) 1    ABduction Limitations scaption  Shoulder Exercises: Standing   External Rotation AROM;Strengthening;Left;20 reps    Theraband Level (Shoulder External Rotation) Level 1 (Yellow)    Internal Rotation AROM;Strengthening;Left;20 reps    Theraband Level (Shoulder Internal Rotation) Level 3 (Green)    Extension Limitations Freemotion cable extension 7 lbs. bilat. UE 2x10    Row Limitations Freemotion bilat. cable row 7 lbs. 3x10    Other Standing Exercises wall push ups 2x10      Shoulder Exercises: Pulleys   Flexion 2 minutes    Scaption 2 minutes      Shoulder Exercises: ROM/Strengthening   Nustep L2-4 x 5 min UE/LE      Manual Therapy   Joint Mobilization left GH mobilization distraction and  AP and caudal glides grade I-III    Soft tissue mobilization left posterior shoulder    Passive ROM Left shoulder 4-way PROM                     PT Short Term Goals - 02/01/20 1535      PT SHORT TERM GOAL #1   Title Independent with initial HEP    Baseline instructed at eval    Time 4    Period Weeks    Status New      PT SHORT TERM GOAL #2   Title Increase left shoulder AROM to at least 90 deg or greater to work towards improved reaching ability for ADLs such as grooming    Baseline 40 deg AROM, 30 deg PROM limited by pain and muscle guarding    Time 4    Period Weeks    Status New             PT Long Term Goals - 03/07/20 1527      PT LONG TERM GOAL #1   Title Improve FOTO outcome measure score to 37% or less impairment    Baseline 53% limited    Time 8    Period Weeks    Status On-going      PT LONG TERM GOAL #2   Title Left shoulder AROM grossly WFL to dress and bathe with left UE use    Baseline ongoing    Time 8    Period Weeks    Status On-going      PT LONG TERM GOAL #3   Title Left shoulder strength grossly 5/5 to improve ability for lifting activities and to assist return to work pending MD clearance    Baseline ongoing    Time 8    Period Weeks    Status On-going      PT LONG TERM GOAL #4   Title Perform reaching ADLs including dressing, bathing, chores as needed with left shoulder pain 3/10 or less    Baseline 6/10    Time 8    Period Weeks    Status On-going                 Plan - 03/21/20 1143    Clinical Impression Statement Still with shoulder stiffness and weakness but making mild gains with AROM from previous status (though IR reach behind back still limited to sacral region). Improving tolerance for exercises with ability to progress strengthening as noted per flowsheet. Plan continu PT for further progress to improve functional abilities for left UE reaching and lifting activities.    Personal Factors and Comorbidities Other    Examination-Activity Limitations Lift;Hygiene/Grooming;Bathing;Dressing;Sleep;Bed Mobility;Carry;Reach Overhead     Examination-Participation Restrictions Driving;Meal Prep;Laundry;Cleaning;Occupation  Stability/Clinical Decision Making Stable/Uncomplicated    Clinical Decision Making Low    Rehab Potential Good    PT Frequency --   2-3x/week   PT Duration 8 weeks    PT Treatment/Interventions ADLs/Self Care Home Management;Cryotherapy;Electrical Stimulation;Moist Heat;Therapeutic activities;Therapeutic exercise;Patient/family education;Manual techniques;Passive range of motion;Dry needling;Neuromuscular re-education;Taping;Vasopneumatic Device    PT Next Visit Plan continue ROM emphasis, progress shoulder AROM and strengthening as tolerated pending pain    PT Home Exercise Plan Access code: [email or web address]    Consulted and Agree with Plan of Care Patient           Patient will benefit from skilled therapeutic intervention in order to improve the following deficits and impairments:  Pain, Impaired UE functional use, Decreased strength, Decreased range of motion, Hypomobility  Visit Diagnosis: Incomplete tear of left rotator cuff, unspecified whether traumatic  Chronic left shoulder pain  Stiffness of left shoulder, not elsewhere classified  Muscle weakness (generalized)     Problem List Patient Active Problem List   Diagnosis Date Noted  . Left carpal tunnel syndrome 12/27/2019  . Adhesive capsulitis of left shoulder 12/27/2019  . Tendinopathy of left biceps 12/27/2019  . Hyperlipidemia with target LDL less than 100 06/23/2012  . Family history of heart disease in male family member before age 57 06/23/2012  . Current smoker 06/23/2012    Lazarus Gowda, PT, DPT 03/21/20 11:45 AM  [facility] [address] [place], [place], [address] Phone: [phone]   Fax:  [phone]  Name: Oscar Myers MRN: [id number] Date of Birth: [birth year]

## 2020-04-03 ENCOUNTER — Ambulatory Visit (INDEPENDENT_AMBULATORY_CARE_PROVIDER_SITE_OTHER): Payer: No Typology Code available for payment source | Admitting: Physician Assistant

## 2020-04-03 ENCOUNTER — Other Ambulatory Visit: Payer: Self-pay

## 2020-04-03 ENCOUNTER — Ambulatory Visit: Payer: Self-pay

## 2020-04-03 ENCOUNTER — Encounter: Payer: Self-pay | Admitting: Orthopaedic Surgery

## 2020-04-03 ENCOUNTER — Ambulatory Visit: Payer: No Typology Code available for payment source | Attending: Physician Assistant | Admitting: Physical Therapy

## 2020-04-03 VITALS — Wt 184.0 lb

## 2020-04-03 DIAGNOSIS — G8929 Other chronic pain: Secondary | ICD-10-CM

## 2020-04-03 DIAGNOSIS — Z9889 Other specified postprocedural states: Secondary | ICD-10-CM | POA: Diagnosis not present

## 2020-04-03 DIAGNOSIS — M75112 Incomplete rotator cuff tear or rupture of left shoulder, not specified as traumatic: Secondary | ICD-10-CM | POA: Insufficient documentation

## 2020-04-03 DIAGNOSIS — M6281 Muscle weakness (generalized): Secondary | ICD-10-CM | POA: Diagnosis present

## 2020-04-03 DIAGNOSIS — M25512 Pain in left shoulder: Secondary | ICD-10-CM | POA: Diagnosis not present

## 2020-04-03 DIAGNOSIS — M25612 Stiffness of left shoulder, not elsewhere classified: Secondary | ICD-10-CM | POA: Insufficient documentation

## 2020-04-03 NOTE — Progress Notes (Signed)
Subjective: Patient is here for ultrasound-guided intra-articular left glenohumeral injection.  Pain and stiffness s/p scope.  Objective:  Decreased overhead and behind the back reach.  Procedure: Ultrasound guided injection is preferred based studies that show increased duration, increased effect, greater accuracy, decreased procedural pain, increased response rate, and decreased cost with ultrasound guided versus blind injection.   Verbal informed consent obtained.  Time-out conducted.  Noted no overlying erythema, induration, or other signs of local infection. Ultrasound-guided left glenohumeral injection: After sterile prep with Betadine, injected 8 cc 1% lidocaine without epinephrine and 40 mg methylprednisolone using a 22-gauge spinal needle, passing the needle from posterior approach into the glenohumeral joint.  Injectate seen filling joint capsule.

## 2020-04-03 NOTE — Progress Notes (Signed)
Post-Op Visit Note   Patient: Oscar Myers           Date of Birth: 03-25-1956           MRN: [id number] Visit Date: 04/03/2020 PCP: Ronnald Nian, MD   Assessment & Plan:  Chief Complaint:  Chief Complaint  Patient presents with  . Left Shoulder - Pain   Visit Diagnoses:  1. S/P arthroscopy of left shoulder   2. S/P carpal tunnel release     Plan: Patient is a pleasant 64 year old gentleman who comes in today 12 weeks out left shoulder arthroscopic debridement, decompression and manipulation under anesthesia as well as left carpal tunnel release.  He has been doing okay.  He has been in physical therapy for his shoulder where he is making slow but steady progress.  He complains mostly of pain that he does not think will ever improve.  In regards to his left wrist, he still has soreness there.  No numbness, tingling or burning.  Examination of his left shoulder reveals forward flexion to about 120 degrees.  I am able to get him to about 160 degrees.  He has slight limitation with internal and external rotation.  Left wrist shows a fully healed surgical scar without complication.  Fingers are warm well perfused.  Is neurovascular intact distally.  In regards to his shoulder, we have referred him to Dr. Prince Rome for repeat glenohumeral cortisone injection.  He will continue to push things in physical therapy.  He will follow up with [place] in 3 months time for repeat evaluation and MMI. If he is currently working, we will then obtain an FCE.  Follow-Up Instructions: Return in about 3 months (around 07/02/2020).   Orders:  Orders Placed This Encounter  Procedures  . US Guided Needle Placement - No Linked Charges   No orders of the defined types were placed in this encounter.   Imaging: No new imaging  PMFS History: Patient Active Problem List   Diagnosis Date Noted  . Left carpal tunnel syndrome 12/27/2019  . Adhesive capsulitis of left shoulder 12/27/2019  . Tendinopathy of left  biceps 12/27/2019  . Hyperlipidemia with target LDL less than 100 06/23/2012  . Family history of heart disease in male family member before age 62 06/23/2012  . Current smoker 06/23/2012   Past Medical History:  Diagnosis Date  . CTS (carpal tunnel syndrome)   . Dyslipidemia     History reviewed. No pertinent family history.  Past Surgical History:  Procedure Laterality Date  . BACK SURGERY    . CARPAL TUNNEL RELEASE Left 04/30/2016   Procedure: LEFT CARPAL TUNNEL RELEASE ENDOSCOPIC;  Surgeon: Sheral Apley, MD;  Location: [facility];  Service: Orthopedics;  Laterality: Left;  . CARPAL TUNNEL RELEASE Left 01/10/2020   Procedure: LEFT CARPAL TUNNEL RELEASE;  Surgeon: Tarry Kos, MD;  Location: [facility];  Service: Orthopedics;  Laterality: Left;  . COLONOSCOPY  2012  . SHOULDER ARTHROSCOPY WITH ROTATOR CUFF REPAIR AND SUBACROMIAL DECOMPRESSION Left 01/10/2020   Procedure: LEFT SHOULDER ARTHROSCOPY WITH EXTENSIVE DEBRIDEMENT, SUBACROMIAL DECOMPRESSION, DISTAL CLAVICLE EXCISION, MANIPULATION UNDER ANESTHESIA;  Surgeon: Tarry Kos, MD;  Location: [facility];  Service: Orthopedics;  Laterality: Left;   Social History   Occupational History  . Not on file  Tobacco Use  . Smoking status: Former Smoker    Packs/day: 0.50    Years: 25.00    Pack years: 12.50    Types:  Cigarettes  . Smokeless tobacco: Never Used  . Tobacco comment: as of 5 9 2012  Vaping Use  . Vaping Use: Never used  Substance and Sexual Activity  . Alcohol use: Yes    Comment: socially- 5 per week  . Drug use: No  . Sexual activity: Yes

## 2020-04-04 ENCOUNTER — Encounter: Payer: Self-pay | Admitting: Physical Therapy

## 2020-04-04 NOTE — Therapy (Signed)
[facility] [address] [place], [place], [address] Phone: [phone]   Fax:  [phone]  Physical Therapy Treatment  Patient Details  Name: Oscar Myers MRN: [id number] Date of Birth: [birth year] Referring Provider (PT): Jari Sportsman, PA-C (surgery by Gershon Mussel, MD)   Encounter Date: 04/03/2020   PT End of Session - 04/03/20 1548    Visit Number 14    Number of Visits 15    Date for PT Re-Evaluation 03/28/20    Authorization Type [facility]    Authorization Time Period 11/03/19-05/01/20    Authorization - Visit Number 14    Authorization - Number of Visits 15    PT Start Time 1545    PT Stop Time 1625    PT Time Calculation (min) 40 min    Activity Tolerance Patient tolerated treatment well    Behavior During Therapy [facility] for tasks assessed/performed           Past Medical History:  Diagnosis Date  . CTS (carpal tunnel syndrome)   . Dyslipidemia     Past Surgical History:  Procedure Laterality Date  . BACK SURGERY    . CARPAL TUNNEL RELEASE Left 04/30/2016   Procedure: LEFT CARPAL TUNNEL RELEASE ENDOSCOPIC;  Surgeon: Sheral Apley, MD;  Location: [facility];  Service: Orthopedics;  Laterality: Left;  . CARPAL TUNNEL RELEASE Left 01/10/2020   Procedure: LEFT CARPAL TUNNEL RELEASE;  Surgeon: Tarry Kos, MD;  Location: [facility];  Service: Orthopedics;  Laterality: Left;  . COLONOSCOPY  2012  . SHOULDER ARTHROSCOPY WITH ROTATOR CUFF REPAIR AND SUBACROMIAL DECOMPRESSION Left 01/10/2020   Procedure: LEFT SHOULDER ARTHROSCOPY WITH EXTENSIVE DEBRIDEMENT, SUBACROMIAL DECOMPRESSION, DISTAL CLAVICLE EXCISION, MANIPULATION UNDER ANESTHESIA;  Surgeon: Tarry Kos, MD;  Location: [facility];  Service: Orthopedics;  Laterality: Left;    There were no vitals filed for this visit.   Subjective Assessment - 04/03/20 1552    Subjective Patient conntinues to have pain but it has  improved lsightly since his injection. He had an injection earleir today. He was not toled to hold on PT btu we will still not do anything too agressive.    Pertinent History remote history of prevoius left shoulder surgery in the late 1970s, pt. also has right shoulder pain issues as well    Limitations House hold activities;Lifting    Diagnostic tests X-rays    Patient Stated Goals Get shoulder pain, return to work    Currently in Pain? Yes    Pain Score 5     Pain Location Shoulder    Pain Orientation Left    Pain Descriptors / Indicators Aching    Pain Type Acute pain    Pain Onset More than a month ago    Pain Frequency Constant    Aggravating Factors  reaching activity    Pain Relieving Factors ice and medication    Effect of Pain on Daily Activities limits ability    Multiple Pain Sites No                             [facility] Adult PT Treatment/Exercise - 04/04/20 0001      Self-Care   Self-Care Other Self-Care Comments    Other Self-Care Comments  reviewed what to do over the weekend in regards to the shot then ramping exercises back up as tolerated. He reports he liekly over does it at time.  Shoulder Exercises: Pulleys   Flexion 2 minutes    Scaption 2 minutes      Manual Therapy   Joint Mobilization left GH mobilization distraction and  AP and caudal glides grade I-III    Soft tissue mobilization left posterior shoulder    Passive ROM Left shoulder 4-way PROM                  PT Education - 04/04/20 0806    Education Details reviewed HEP and symptom mangement    Person(s) Educated Patient    Methods Explanation;Demonstration;Tactile cues;Verbal cues    Comprehension Returned demonstration;Verbal cues required;Tactile cues required;Verbalized understanding            PT Short Term Goals - 02/01/20 1535      PT SHORT TERM GOAL #1   Title Independent with initial HEP    Baseline instructed at eval    Time 4    Period Weeks     Status New      PT SHORT TERM GOAL #2   Title Increase left shoulder AROM to at least 90 deg or greater to work towards improved reaching ability for ADLs such as grooming    Baseline 40 deg AROM, 30 deg PROM limited by pain and muscle guarding    Time 4    Period Weeks    Status New             PT Long Term Goals - 03/07/20 1527      PT LONG TERM GOAL #1   Title Improve FOTO outcome measure score to 37% or less impairment    Baseline 53% limited    Time 8    Period Weeks    Status On-going      PT LONG TERM GOAL #2   Title Left shoulder AROM grossly WFL to dress and bathe with left UE use    Baseline ongoing    Time 8    Period Weeks    Status On-going      PT LONG TERM GOAL #3   Title Left shoulder strength grossly 5/5 to improve ability for lifting activities and to assist return to work pending MD clearance    Baseline ongoing    Time 8    Period Weeks    Status On-going      PT LONG TERM GOAL #4   Title Perform reaching ADLs including dressing, bathing, chores as needed with left shoulder pain 3/10 or less    Baseline 6/10    Time 8    Period Weeks    Status On-going                 Plan - 04/03/20 1551    Clinical Impression Statement Therpay perfromed light passive ROM on the shoulder today 2nd to having shot this morning. MD did not give him any restrictions but generally we wait about 24-48 hours before therapy.He repmains limited. He reports that his pain decreased with PROM today. He was advised to take it easy over the next few days. He has 2 more visits scheduled and approved. We will likley have to get more visits approved.    Examination-Activity Limitations Lift;Hygiene/Grooming;Bathing;Dressing;Sleep;Bed Mobility;Carry;Reach Overhead    Examination-Participation Restrictions Driving;Meal Prep;Laundry;Cleaning;Occupation    Stability/Clinical Decision Making Stable/Uncomplicated    Clinical Decision Making Low    Rehab Potential Good    PT  Duration 8 weeks    PT Treatment/Interventions ADLs/Self Care Home Management;Cryotherapy;Electrical Stimulation;Moist Heat;Therapeutic activities;Therapeutic exercise;Patient/family education;Manual  techniques;Passive range of motion;Dry needling;Neuromuscular re-education;Taping;Vasopneumatic Device    PT Next Visit Plan continue ROM emphasis, progress shoulder AROM and strengthening as tolerated pending pain    PT Home Exercise Plan Access code: [email or web address]    Consulted and Agree with Plan of Care Patient           Patient will benefit from skilled therapeutic intervention in order to improve the following deficits and impairments:  Pain, Impaired UE functional use, Decreased strength, Decreased range of motion, Hypomobility  Visit Diagnosis: Incomplete tear of left rotator cuff, unspecified whether traumatic  Chronic left shoulder pain  Stiffness of left shoulder, not elsewhere classified  Muscle weakness (generalized)     Problem List Patient Active Problem List   Diagnosis Date Noted  . Left carpal tunnel syndrome 12/27/2019  . Adhesive capsulitis of left shoulder 12/27/2019  . Tendinopathy of left biceps 12/27/2019  . Hyperlipidemia with target LDL less than 100 06/23/2012  . Family history of heart disease in male family member before age 22 06/23/2012  . Current smoker 06/23/2012    Dessie Coma PT DPT  04/04/2020, 1:26 PM  [facility] [address] [place], [place], [address] Phone: [phone]   Fax:  [phone]  Name: Oscar Myers MRN: [id number] Date of Birth: [birth year]

## 2020-04-05 ENCOUNTER — Encounter (HOSPITAL_COMMUNITY): Payer: Self-pay

## 2020-04-05 ENCOUNTER — Other Ambulatory Visit: Payer: Self-pay

## 2020-04-05 ENCOUNTER — Emergency Department (HOSPITAL_COMMUNITY)
Admission: EM | Admit: 2020-04-05 | Discharge: 2020-04-06 | Disposition: A | Discharge status: Home / Self Care | Payer: No Typology Code available for payment source | Attending: Emergency Medicine | Admitting: Emergency Medicine

## 2020-04-05 DIAGNOSIS — R3589 Other polyuria: Secondary | ICD-10-CM | POA: Insufficient documentation

## 2020-04-05 DIAGNOSIS — Z7982 Long term (current) use of aspirin: Secondary | ICD-10-CM | POA: Diagnosis not present

## 2020-04-05 DIAGNOSIS — E860 Dehydration: Secondary | ICD-10-CM | POA: Insufficient documentation

## 2020-04-05 DIAGNOSIS — R739 Hyperglycemia, unspecified: Secondary | ICD-10-CM | POA: Diagnosis not present

## 2020-04-05 DIAGNOSIS — R634 Abnormal weight loss: Secondary | ICD-10-CM | POA: Diagnosis not present

## 2020-04-05 DIAGNOSIS — Z87891 Personal history of nicotine dependence: Secondary | ICD-10-CM | POA: Diagnosis not present

## 2020-04-05 LAB — BASIC METABOLIC PANEL
Anion gap: 10 (ref 5–15)
BUN: 13 mg/dL (ref 8–23)
CO2: 23 mmol/L (ref 22–32)
Calcium: 10.3 mg/dL (ref 8.9–10.3)
Chloride: 96 mmol/L — ABNORMAL LOW (ref 98–111)
Creatinine, Ser: 0.84 mg/dL (ref 0.61–1.24)
GFR, Estimated: >60 mL/min (ref >60)
Glucose, Bld: 556 mg/dL (ref 70–99)
Potassium: 4.2 mmol/L (ref 3.5–5.1)
Sodium: 129 mmol/L — ABNORMAL LOW (ref 135–145)

## 2020-04-05 LAB — CBC
HCT: 39.3 % (ref 39.0–52.0)
Hemoglobin: 13.3 g/dL (ref 13.0–17.0)
MCH: 29.9 pg (ref 26.0–34.0)
MCHC: 33.8 g/dL (ref 30.0–36.0)
MCV: 88.3 fL (ref 80.0–100.0)
Platelets: 138 10*3/uL — ABNORMAL LOW (ref 150–400)
RBC: 4.45 MIL/uL (ref 4.22–5.81)
RDW: 11.6 % (ref 11.5–15.5)
WBC: 4.6 10*3/uL (ref 4.0–10.5)
nRBC: 0 % (ref 0.0–0.2)

## 2020-04-05 LAB — CBG MONITORING, ED: Glucose-Capillary: 564 mg/dL (ref 70–99)

## 2020-04-05 MED ORDER — LACTATED RINGERS IV BOLUS
2000.0000 mL | Freq: Once | INTRAVENOUS | Status: AC
  Administered 2020-04-06: 2000 mL via INTRAVENOUS

## 2020-04-05 NOTE — ED Triage Notes (Signed)
Pt reports losing a significant amount of weight in the last month. Pt states his brother lost a lot of weight before he passed and he was a diabetic so he became worried and checked his blood glucose. Pt denies any physical symptoms at this time.

## 2020-04-06 LAB — URINALYSIS, ROUTINE W REFLEX MICROSCOPIC
Bacteria, UA: NONE SEEN
Bilirubin Urine: NEGATIVE
Glucose, UA: >=500 mg/dL — AB
Hgb urine dipstick: NEGATIVE
Ketones, ur: 5 mg/dL — AB
Leukocytes,Ua: NEGATIVE
Nitrite: NEGATIVE
Protein, ur: NEGATIVE mg/dL
Specific Gravity, Urine: 1.033 — ABNORMAL HIGH (ref 1.005–1.030)
pH: 6 (ref 5.0–8.0)

## 2020-04-06 LAB — CBG MONITORING, ED: Glucose-Capillary: 392 mg/dL — ABNORMAL HIGH (ref 70–99)

## 2020-04-06 NOTE — ED Provider Notes (Signed)
[facility]-EMERGENCY DEPT Provider Note   CSN: [id number] Arrival date & time: 04/05/20  2130     History Chief Complaint  Patient presents with  . Hyperglycemia    Oscar Myers is a 64 y.o. male.  The history is provided by the patient and the spouse.  Hyperglycemia Severity:  Moderate Onset quality:  Gradual Timing:  Constant Progression:  Worsening Chronicity:  New Diabetes status:  Non-diabetic Context: new diabetes diagnosis   Relieved by:  Nothing Associated symptoms: fatigue, increased appetite, increased thirst, polyuria and weight change   Associated symptoms: no abdominal pain, no blurred vision, no chest pain, no shortness of breath and no vomiting   Patient with history of dyslipidemia presents with fatigue and hyperglycemia.  He reports that about 11 pound weight loss in the past 2 weeks.  He reports polyuria polydipsia.  He reports he checked his glucose at home and it was elevated.  He reports he was told he had prediabetes several years ago but never started any medication for it.  He was instructed that time to start Metformin.      Past Medical History:  Diagnosis Date  . CTS (carpal tunnel syndrome)   . Dyslipidemia     Patient Active Problem List   Diagnosis Date Noted  . Left carpal tunnel syndrome 12/27/2019  . Adhesive capsulitis of left shoulder 12/27/2019  . Tendinopathy of left biceps 12/27/2019  . Hyperlipidemia with target LDL less than 100 06/23/2012  . Family history of heart disease in male family member before age 40 06/23/2012  . Current smoker 06/23/2012    Past Surgical History:  Procedure Laterality Date  . BACK SURGERY    . CARPAL TUNNEL RELEASE Left 04/30/2016   Procedure: LEFT CARPAL TUNNEL RELEASE ENDOSCOPIC;  Surgeon: Sheral Apley, MD;  Location: [facility];  Service: Orthopedics;  Laterality: Left;  . CARPAL TUNNEL RELEASE Left 01/10/2020   Procedure: LEFT CARPAL TUNNEL  RELEASE;  Surgeon: Tarry Kos, MD;  Location: [facility];  Service: Orthopedics;  Laterality: Left;  . COLONOSCOPY  2012  . SHOULDER ARTHROSCOPY WITH ROTATOR CUFF REPAIR AND SUBACROMIAL DECOMPRESSION Left 01/10/2020   Procedure: LEFT SHOULDER ARTHROSCOPY WITH EXTENSIVE DEBRIDEMENT, SUBACROMIAL DECOMPRESSION, DISTAL CLAVICLE EXCISION, MANIPULATION UNDER ANESTHESIA;  Surgeon: Tarry Kos, MD;  Location: [facility];  Service: Orthopedics;  Laterality: Left;       History reviewed. No pertinent family history.  Social History   Tobacco Use  . Smoking status: Former Smoker    Packs/day: 0.50    Years: 25.00    Pack years: 12.50    Types: Cigarettes  . Smokeless tobacco: Never Used  . Tobacco comment: as of 5 9 2012  Vaping Use  . Vaping Use: Never used  Substance Use Topics  . Alcohol use: Yes    Comment: socially- 5 per week  . Drug use: No    Home Medications Prior to Admission medications   Medication Sig Start Date End Date Taking? Authorizing Provider  aspirin EC 81 MG tablet Take 81 mg by mouth daily. Swallow whole.   Yes [provider]  atorvastatin (LIPITOR) 20 MG tablet Take 20 mg by mouth daily.    Yes [provider]  omeprazole (PRILOSEC) 40 MG capsule Take 40 mg by mouth daily.    Yes [provider]    Allergies    Patient has no known allergies.  Review of Systems   Review of  Systems  Constitutional: Positive for fatigue and unexpected weight change.  Eyes: Negative for blurred vision.  Respiratory: Negative for shortness of breath.   Cardiovascular: Negative for chest pain.  Gastrointestinal: Negative for abdominal pain and vomiting.  Endocrine: Positive for polydipsia and polyuria.  All other systems reviewed and are negative.   Physical Exam Updated Vital Signs BP 119/88   Pulse 91   Temp 98.7 F (37.1 C) (Oral)   Resp 17   Ht 1.778 m (5\' 10" )   Wt 83.5 kg   SpO2 95%   BMI 26.40 kg/m    Physical Exam CONSTITUTIONAL: Well developed/well nourished, no distress watching television HEAD: Normocephalic/atraumatic EYES: EOMI/PERRL NECK: supple no meningeal signs SPINE/BACK:entire spine nontender CV: S1/S2 noted, no murmurs/rubs/gallops noted LUNGS: Lungs are clear to auscultation bilaterally, no apparent distress ABDOMEN: soft, nontender, no rebound or guarding, bowel sounds noted throughout abdomen GU:no cva tenderness NEURO: Pt is awake/alert/appropriate, moves all extremitiesx4.  No facial droop.   EXTREMITIES: pulses normal/equal, full ROM SKIN: warm, color normal PSYCH: no abnormalities of mood noted, alert and oriented to situation  ED Results / Procedures / Treatments   Labs (all labs ordered are listed, but only abnormal results are displayed) Labs Reviewed  BASIC METABOLIC PANEL - Abnormal; Notable for the following components:      Result Value   Sodium 129 (*)    Chloride 96 (*)    Glucose, Bld 556 (*)    All other components within normal limits  CBC - Abnormal; Notable for the following components:   Platelets 138 (*)    All other components within normal limits  URINALYSIS, ROUTINE W REFLEX MICROSCOPIC - Abnormal; Notable for the following components:   Color, Urine STRAW (*)    Specific Gravity, Urine 1.033 (*)    Glucose, UA >=500 (*)    Ketones, ur 5 (*)    All other components within normal limits  CBG MONITORING, ED - Abnormal; Notable for the following components:   Glucose-Capillary 564 (*)    All other components within normal limits  CBG MONITORING, ED - Abnormal; Notable for the following components:   Glucose-Capillary 392 (*)    All other components within normal limits  CBG MONITORING, ED    EKG None  Radiology No results found.  Procedures Procedures (including critical care time)  Medications Ordered in ED Medications  lactated ringers bolus 2,000 mL (0 mLs Intravenous Stopped 04/06/20 0121)    ED Course  I have  reviewed the triage vital signs and the nursing notes.  Pertinent labs results that were available during my care of the patient were reviewed by me and considered in my medical decision making (see chart for details).    MDM Rules/Calculators/A&P                          12:18 AM Patient presents with fatigue and weight loss and polyuria, found to have hyperglycemia.  Patient is dehydrated but there is no anion gap. He has follow-up arranged already with his PCP in the next 2 to 3 weeks.  Plan will be to restart Metformin for patient and have him follow-up as an outpatient 1:53 AM Glucose improving.  Patient feels improved.  No acute distress. He will be discharged home.  He reports he already has a prescription for Metformin 500 mg at home.  He was start this medication twice daily.  He will follow-up soon with his PCP at the [facility]  Final Clinical Impression(s) / ED Diagnoses Final diagnoses:  Hyperglycemia  Dehydration    Rx / DC Orders ED Discharge Orders    None       Zadie Rhine, MD 04/06/20 [phone]

## 2020-04-09 ENCOUNTER — Ambulatory Visit: Payer: No Typology Code available for payment source | Admitting: Physical Therapy

## 2020-04-09 ENCOUNTER — Other Ambulatory Visit: Payer: Self-pay

## 2020-04-09 ENCOUNTER — Encounter: Payer: Self-pay | Admitting: Physical Therapy

## 2020-04-09 DIAGNOSIS — G8929 Other chronic pain: Secondary | ICD-10-CM

## 2020-04-09 DIAGNOSIS — M75112 Incomplete rotator cuff tear or rupture of left shoulder, not specified as traumatic: Secondary | ICD-10-CM | POA: Diagnosis not present

## 2020-04-09 DIAGNOSIS — M6281 Muscle weakness (generalized): Secondary | ICD-10-CM

## 2020-04-09 DIAGNOSIS — M25612 Stiffness of left shoulder, not elsewhere classified: Secondary | ICD-10-CM

## 2020-04-09 DIAGNOSIS — M25512 Pain in left shoulder: Secondary | ICD-10-CM

## 2020-04-09 NOTE — Therapy (Signed)
[place], [place], [address] Phone: [phone]   Fax:  [phone]  Physical Therapy Treatment/Recertification  Patient Details  Name: Oscar Myers MRN: [id number] Date of Birth: 12-03-1955 Referring Provider (PT): Dwana Melena, PA-C (surgery by Frankey Shown, MD)   Encounter Date: 04/09/2020   PT End of Session - 04/09/20 1410    Visit Number 15    Number of Visits 26    Date for PT Re-Evaluation 05/21/20    Authorization Type VA    Authorization Time Period 11/03/19-05/01/20    Authorization - Visit Number 15    Authorization - Number of Visits 15    PT Start Time 1409    PT Stop Time 1453    PT Time Calculation (min) 44 min    Activity Tolerance Patient tolerated treatment well    Behavior During Therapy [facility] for tasks assessed/performed           Past Medical History:  Diagnosis Date  . CTS (carpal tunnel syndrome)   . Diabetes mellitus without complication ([place])   . Dyslipidemia     Past Surgical History:  Procedure Laterality Date  . BACK SURGERY    . CARPAL TUNNEL RELEASE Left 04/30/2016   Procedure: LEFT CARPAL TUNNEL RELEASE ENDOSCOPIC;  Surgeon: Renette Butters, MD;  Location: [place];  Service: Orthopedics;  Laterality: Left;  . CARPAL TUNNEL RELEASE Left 01/10/2020   Procedure: LEFT CARPAL TUNNEL RELEASE;  Surgeon: Leandrew Koyanagi, MD;  Location: [place];  Service: Orthopedics;  Laterality: Left;  . COLONOSCOPY  2012  . SHOULDER ARTHROSCOPY WITH ROTATOR CUFF REPAIR AND SUBACROMIAL DECOMPRESSION Left 01/10/2020   Procedure: LEFT SHOULDER ARTHROSCOPY WITH EXTENSIVE DEBRIDEMENT, SUBACROMIAL DECOMPRESSION, DISTAL CLAVICLE EXCISION, MANIPULATION UNDER ANESTHESIA;  Surgeon: Leandrew Koyanagi, MD;  Location: [place];  Service: Orthopedics;  Laterality: Left;    There were no vitals filed for this visit.   Subjective Assessment - 04/09/20  1444    Subjective Left shoulder pain 5/10 pre-tx. Of note pt. went to ED 04/05/20 with hyperglycemia issues (was seen and discharged home) and was subsequently diagnosed with diabetes and prescribed Metformin by [facility] doctor. Improvement rated at about 35-40% from baseline with still noting shoulder pain and stiffness but making gradual improvements.    Pertinent History remote history of prevoius left shoulder surgery in the late 1970s, pt. also has right shoulder pain issues as well    Limitations House hold activities;Lifting    Diagnostic tests X-rays    Patient Stated Goals Get shoulder pain, return to work    Currently in Pain? Yes    Pain Score 5     Pain Location Shoulder    Pain Orientation Left    Pain Descriptors / Indicators Aching    Pain Type Acute pain    Pain Onset More than a month ago    Pain Frequency Constant    Aggravating Factors  reaching, ADLs    Pain Relieving Factors ice and medication    Effect of Pain on Daily Activities limits ability for reaching ADLs              [facility] PT Assessment - 04/09/20 0001      Observation/Other Assessments   Focus on Therapeutic Outcomes (FOTO)  45% limited      ROM / Strength   AROM / PROM / Strength PROM      AROM   Left Shoulder Flexion 110  Degrees    Left Shoulder ABduction 80 Degrees    Left Shoulder Internal Rotation --   reach to sacrum   Left Shoulder External Rotation --   reach to upper trapezius region     PROM   Overall PROM Comments ROM as noted below limited with firm, capsular endfeels    Left Shoulder Flexion 110 Degrees    Left Shoulder ABduction 90 Degrees    Left Shoulder Internal Rotation 45 Degrees    Left Shoulder External Rotation 55 Degrees      Strength   Left Shoulder Flexion 4/5    Left Shoulder ABduction 4-/5   within available ROM   Left Shoulder Internal Rotation 5/5    Left Shoulder External Rotation 4/5                         [facility] Adult PT Treatment/Exercise -  04/09/20 0001      Shoulder Exercises: Supine   Flexion AROM;Left;20 reps    Shoulder Flexion Weight (lbs) 1      Shoulder Exercises: Sidelying   External Rotation AROM;Strengthening;Left;20 reps    External Rotation Weight (lbs) 1    ABduction AROM;Left;20 reps    ABduction Limitations plane of scaption with full can hand position      Shoulder Exercises: Standing   External Rotation AROM;Strengthening;Left;20 reps    Theraband Level (Shoulder External Rotation) Level 2 (Red)    Internal Rotation AROM;Strengthening;Left;20 reps    Theraband Level (Shoulder Internal Rotation) Level 3 (Green)    Flexion Limitations [place] flexion red band 2x10 left side    Extension AROM;Strengthening;Both;20 reps      Shoulder Exercises: Pulleys   Flexion 2 minutes    Scaption 2 minutes      Manual Therapy   Joint Mobilization Left GH mobilization AP and caudal glides grade I-III    Soft tissue mobilization left posterior shoulder and subscapularis    Passive ROM Left shoulder 4-way PROM                    PT Short Term Goals - 04/09/20 1439      PT SHORT TERM GOAL #1   Title Independent with initial HEP    Baseline met    Time 4    Period Weeks    Status Achieved      PT SHORT TERM GOAL #2   Title Increase left shoulder AROM to at least 90 deg or greater to work towards improved reaching ability for ADLs such as grooming    Baseline flexion 110 deg, abduction 80 deg    Time 4    Period Weeks    Status Partially Met             PT Long Term Goals - 04/09/20 1440      PT LONG TERM GOAL #1   Title Improve FOTO outcome measure score to 37% or less impairment    Baseline 45% limited    Time 6    Period Weeks    Status On-going    Target Date 05/21/20      PT LONG TERM GOAL #2   Title Left shoulder AROM grossly WFL to dress and bathe with left UE use    Baseline ongoing-still limited by stiffness, see objective    Time 6    Period Weeks    Status On-going     Target Date 05/21/20      PT LONG  TERM GOAL #3   Title Left shoulder strength grossly 5/5 to improve ability for lifting activities and to assist return to work pending MD clearance    Baseline see objective-ongoing    Time 6    Period Weeks    Status On-going    Target Date 05/21/20      PT LONG TERM GOAL #4   Title Perform reaching ADLs including dressing, bathing, chores as needed with left shoulder pain 3/10 or less    Baseline 5/10 04/09/20    Time 6    Period Weeks    Status On-going    Target Date 05/21/20                 Plan - 04/09/20 1453    Clinical Impression Statement Pt. still with limitation of left shoulder ROM with associated capsular restriction but has made gradual progress from baseline. Left shoulder strength gradually improving as well from previous status and making functional improvements as evidenced by gradual improvements in FOTO score as well. Plan continue PT for further work on improving left shoulder ROM and strength to improve functional abilities for reaching activities and chores/lifting motions.    Personal Factors and Comorbidities Other    Examination-Activity Limitations Lift;Hygiene/Grooming;Bathing;Dressing;Sleep;Bed Mobility;Carry;Reach Overhead    Examination-Participation Restrictions Driving;Meal Prep;Laundry;Cleaning;Occupation    Stability/Clinical Decision Making Stable/Uncomplicated    Clinical Decision Making Low    Rehab Potential Good    PT Frequency --   2-3x/week   PT Duration 6 weeks    PT Treatment/Interventions ADLs/Self Care Home Management;Cryotherapy;Electrical Stimulation;Moist Heat;Therapeutic activities;Therapeutic exercise;Patient/family education;Manual techniques;Passive range of motion;Dry needling;Neuromuscular re-education;Taping;Vasopneumatic Device    PT Next Visit Plan continue ROM emphasis, progress shoulder AROM and strengthening as tolerated pending pain    PT Home Exercise Plan Access code: [email or web address]     Consulted and Agree with Plan of Care Patient           Patient will benefit from skilled therapeutic intervention in order to improve the following deficits and impairments:  Pain, Impaired UE functional use, Decreased strength, Decreased range of motion, Hypomobility  Visit Diagnosis: Incomplete tear of left rotator cuff, unspecified whether traumatic  Chronic left shoulder pain  Stiffness of left shoulder, not elsewhere classified  Muscle weakness (generalized)     Problem List Patient Active Problem List   Diagnosis Date Noted  . Left carpal tunnel syndrome 12/27/2019  . Adhesive capsulitis of left shoulder 12/27/2019  . Tendinopathy of left biceps 12/27/2019  . Hyperlipidemia with target LDL less than 100 06/23/2012  . Family history of heart disease in male family member before age 38 06/23/2012  . Current smoker 06/23/2012    Beaulah Dinning, PT, DPT 04/09/20 2:57 PM  [place] [facility] [address] [place], [place], [address] Phone: [phone]   Fax:  [phone]  Name: Oscar Myers MRN: [id number] Date of Birth: 05/28/1955

## 2020-04-11 ENCOUNTER — Encounter: Payer: Self-pay | Admitting: Physical Therapy

## 2020-04-11 ENCOUNTER — Other Ambulatory Visit: Payer: Self-pay

## 2020-04-11 ENCOUNTER — Ambulatory Visit: Payer: No Typology Code available for payment source | Admitting: Physical Therapy

## 2020-04-11 DIAGNOSIS — M75112 Incomplete rotator cuff tear or rupture of left shoulder, not specified as traumatic: Secondary | ICD-10-CM

## 2020-04-11 DIAGNOSIS — M6281 Muscle weakness (generalized): Secondary | ICD-10-CM

## 2020-04-11 DIAGNOSIS — M25612 Stiffness of left shoulder, not elsewhere classified: Secondary | ICD-10-CM

## 2020-04-11 DIAGNOSIS — G8929 Other chronic pain: Secondary | ICD-10-CM

## 2020-04-11 DIAGNOSIS — M25512 Pain in left shoulder: Secondary | ICD-10-CM

## 2020-04-11 NOTE — Therapy (Signed)
[place], [place], [address] Phone: [phone]   Fax:  [phone]  Physical Therapy Treatment  Patient Details  Name: Oscar Myers MRN: [id number] Date of Birth: 1955-06-27 Referring Provider (PT): Dwana Melena, PA-C (surgery by Frankey Shown, MD)   Encounter Date: 04/11/2020   PT End of Session - 04/11/20 1154    Visit Number 16    Number of Visits 26    Date for PT Re-Evaluation 05/21/20    Authorization Type VA    Authorization Time Period 11/03/19-05/01/20    Authorization - Visit Number 43   today is 15th treatment visit after eval   Authorization - Number of Visits 15   today is 15th treatment visit after eval   PT Start Time 1101    PT Stop Time 1145    PT Time Calculation (min) 44 min    Activity Tolerance Patient tolerated treatment well    Behavior During Therapy [facility] for tasks assessed/performed           Past Medical History:  Diagnosis Date  . CTS (carpal tunnel syndrome)   . Diabetes mellitus without complication ([place])   . Dyslipidemia     Past Surgical History:  Procedure Laterality Date  . BACK SURGERY    . CARPAL TUNNEL RELEASE Left 04/30/2016   Procedure: LEFT CARPAL TUNNEL RELEASE ENDOSCOPIC;  Surgeon: Renette Butters, MD;  Location: [place];  Service: Orthopedics;  Laterality: Left;  . CARPAL TUNNEL RELEASE Left 01/10/2020   Procedure: LEFT CARPAL TUNNEL RELEASE;  Surgeon: Leandrew Koyanagi, MD;  Location: [place];  Service: Orthopedics;  Laterality: Left;  . COLONOSCOPY  2012  . SHOULDER ARTHROSCOPY WITH ROTATOR CUFF REPAIR AND SUBACROMIAL DECOMPRESSION Left 01/10/2020   Procedure: LEFT SHOULDER ARTHROSCOPY WITH EXTENSIVE DEBRIDEMENT, SUBACROMIAL DECOMPRESSION, DISTAL CLAVICLE EXCISION, MANIPULATION UNDER ANESTHESIA;  Surgeon: Leandrew Koyanagi, MD;  Location: [place];  Service: Orthopedics;  Laterality: Left;    There were no  vitals filed for this visit.   Subjective Assessment - 04/11/20 1154    Subjective No new complaints/concerns since last visit. Waiting on [place] approval for further visits.                             [place] Adult PT Treatment/Exercise - 04/11/20 0001      Shoulder Exercises: Supine   Protraction Limitations left serratus punch 2 lbs. 2x10    Horizontal ABduction AROM;Strengthening;Both;20 reps    Theraband Level (Shoulder Horizontal ABduction) Level 3 (Green)      Shoulder Exercises: Standing   External Rotation AROM;Strengthening;Left;20 reps    Theraband Level (Shoulder External Rotation) Level 2 (Red)    Internal Rotation AROM;Strengthening;Left;20 reps    Theraband Level (Shoulder Internal Rotation) Level 3 (Green)    Flexion AROM;Left    ABduction Limitations left shoulder scaption AROM full can position to 90 deg 2x10    Extension AROM;Strengthening;Both;20 reps    Theraband Level (Shoulder Extension) Level 3 (Green)    Row Limitations left unilat. row iwth black cord 2x10    Other Standing Exercises wall push up with a plus    Other Standing Exercises small ball circles at wall x 15 ea. way clockwise/counterclockwise, wall wash flexion with eccentric AROM lowering x 10 reps      Manual Therapy   Joint Mobilization Left GH mobilization AP and caudal glides grade I-III  Soft tissue mobilization left posterior shoulder and subscapularis    Passive ROM Left shoulder 4-way PROM                  PT Education - 04/11/20 1154    Education Details HEP updates    Person(s) Educated Patient    Methods Explanation;Demonstration;Verbal cues    Comprehension Verbalized understanding;Returned demonstration            PT Short Term Goals - 04/09/20 1439      PT SHORT TERM GOAL #1   Title Independent with initial HEP    Baseline met    Time 4    Period Weeks    Status Achieved      PT SHORT TERM GOAL #2   Title Increase left shoulder AROM to at  least 90 deg or greater to work towards improved reaching ability for ADLs such as grooming    Baseline flexion 110 deg, abduction 80 deg    Time 4    Period Weeks    Status Partially Met             PT Long Term Goals - 04/09/20 1440      PT LONG TERM GOAL #1   Title Improve FOTO outcome measure score to 37% or less impairment    Baseline 45% limited    Time 6    Period Weeks    Status On-going    Target Date 05/21/20      PT LONG TERM GOAL #2   Title Left shoulder AROM grossly WFL to dress and bathe with left UE use    Baseline ongoing-still limited by stiffness, see objective    Time 6    Period Weeks    Status On-going    Target Date 05/21/20      PT LONG TERM GOAL #3   Title Left shoulder strength grossly 5/5 to improve ability for lifting activities and to assist return to work pending MD clearance    Baseline see objective-ongoing    Time 6    Period Weeks    Status On-going    Target Date 05/21/20      PT LONG TERM GOAL #4   Title Perform reaching ADLs including dressing, bathing, chores as needed with left shoulder pain 3/10 or less    Baseline 5/10 04/09/20    Time 6    Period Weeks    Status On-going    Target Date 05/21/20                 Plan - 04/11/20 1156    Clinical Impression Statement Decreased tightness noted today with left shoulder PROM and able to progress standing left shoulder AROM for flexion and scaption with good mechanics/minimal compensation for AROM to 90 deg. Progress otherwise still ongoing for remaining LTGs. Plan continue PT for further progress but awaiting further visit approval from [place] so pt. will be contacted to schedule when authorization received.    Personal Factors and Comorbidities Other    Examination-Activity Limitations Lift;Hygiene/Grooming;Bathing;Dressing;Sleep;Bed Mobility;Carry;Reach Overhead    Examination-Participation Restrictions Driving;Meal Prep;Laundry;Cleaning;Occupation    Stability/Clinical  Decision Making Stable/Uncomplicated    Clinical Decision Making Low    Rehab Potential Good    PT Frequency --   2-3x/week   PT Duration 6 weeks    PT Treatment/Interventions ADLs/Self Care Home Management;Cryotherapy;Electrical Stimulation;Moist Heat;Therapeutic activities;Therapeutic exercise;Patient/family education;Manual techniques;Passive range of motion;Dry needling;Neuromuscular re-education;Taping;Vasopneumatic Device    PT Next Visit Plan awaiting further visit approval  from [place], continue shoulder stretching/PROM, AROM and strengthening progression and manual prn    PT Home Exercise Plan Access code: [email or web address]    Consulted and Agree with Plan of Care Patient           Patient will benefit from skilled therapeutic intervention in order to improve the following deficits and impairments:  Pain,Impaired UE functional use,Decreased strength,Decreased range of motion,Hypomobility  Visit Diagnosis: Incomplete tear of left rotator cuff, unspecified whether traumatic  Chronic left shoulder pain  Stiffness of left shoulder, not elsewhere classified  Muscle weakness (generalized)     Problem List Patient Active Problem List   Diagnosis Date Noted  . Left carpal tunnel syndrome 12/27/2019  . Adhesive capsulitis of left shoulder 12/27/2019  . Tendinopathy of left biceps 12/27/2019  . Hyperlipidemia with target LDL less than 100 06/23/2012  . Family history of heart disease in male family member before age 40 06/23/2012  . Current smoker 06/23/2012    Beaulah Dinning, PT, DPT 04/11/20 11:59 AM  [facility] [address] [place], [place], [address] Phone: [phone]   Fax:  [phone]  Name: ZIAN DELAIR MRN: [id number] Date of Birth: 09-Feb-1956

## 2020-05-09 ENCOUNTER — Ambulatory Visit: Payer: No Typology Code available for payment source | Attending: Physician Assistant | Admitting: Physical Therapy

## 2020-05-09 ENCOUNTER — Encounter: Payer: Self-pay | Admitting: Physical Therapy

## 2020-05-09 ENCOUNTER — Other Ambulatory Visit: Payer: Self-pay

## 2020-05-09 DIAGNOSIS — M75112 Incomplete rotator cuff tear or rupture of left shoulder, not specified as traumatic: Secondary | ICD-10-CM | POA: Insufficient documentation

## 2020-05-09 DIAGNOSIS — G8929 Other chronic pain: Secondary | ICD-10-CM | POA: Insufficient documentation

## 2020-05-09 DIAGNOSIS — M25512 Pain in left shoulder: Secondary | ICD-10-CM | POA: Insufficient documentation

## 2020-05-09 DIAGNOSIS — M25612 Stiffness of left shoulder, not elsewhere classified: Secondary | ICD-10-CM | POA: Insufficient documentation

## 2020-05-09 DIAGNOSIS — M6281 Muscle weakness (generalized): Secondary | ICD-10-CM | POA: Insufficient documentation

## 2020-05-09 NOTE — Therapy (Signed)
[place], [place], [address] Phone: [phone]   Fax:  [phone]  Physical Therapy Treatment  Patient Details  Name: Oscar Myers MRN: [id number] Date of Birth: 24-May-1955 Referring Provider (PT): Dwana Melena, PA-C (surgery by Frankey Shown, MD)   Encounter Date: 05/09/2020   PT End of Session - 05/09/20 0938    Visit Number 17    Number of Visits 26    Date for PT Re-Evaluation 05/21/20    Authorization Type VA    Authorization Time Period 11/03/19-05/01/20, 04/30/20-08/29/19    Authorization - Visit Number 1    Authorization - Number of Visits 15    PT Start Time 0930    PT Stop Time 1015    PT Time Calculation (min) 45 min           Past Medical History:  Diagnosis Date  . CTS (carpal tunnel syndrome)   . Diabetes mellitus without complication ([place])   . Dyslipidemia     Past Surgical History:  Procedure Laterality Date  . BACK SURGERY    . CARPAL TUNNEL RELEASE Left 04/30/2016   Procedure: LEFT CARPAL TUNNEL RELEASE ENDOSCOPIC;  Surgeon: Renette Butters, MD;  Location: [place];  Service: Orthopedics;  Laterality: Left;  . CARPAL TUNNEL RELEASE Left 01/10/2020   Procedure: LEFT CARPAL TUNNEL RELEASE;  Surgeon: Leandrew Koyanagi, MD;  Location: [place];  Service: Orthopedics;  Laterality: Left;  . COLONOSCOPY  2012  . SHOULDER ARTHROSCOPY WITH ROTATOR CUFF REPAIR AND SUBACROMIAL DECOMPRESSION Left 01/10/2020   Procedure: LEFT SHOULDER ARTHROSCOPY WITH EXTENSIVE DEBRIDEMENT, SUBACROMIAL DECOMPRESSION, DISTAL CLAVICLE EXCISION, MANIPULATION UNDER ANESTHESIA;  Surgeon: Leandrew Koyanagi, MD;  Location: [place];  Service: Orthopedics;  Laterality: Left;    There were no vitals filed for this visit.   Subjective Assessment - 05/09/20 0935    Subjective Right now not moving 4-5/10 shoulder pain and increases to 6/10 with movement. I think the shoulder  is improving. Doing some HEP 2 x per week.    Currently in Pain? Yes    Pain Score 4     Pain Location Shoulder    Pain Orientation Left    Pain Descriptors / Indicators Dull;Constant    Pain Type Acute pain    Aggravating Factors  getting into refrigerator, reach behind back    Pain Relieving Factors stop moving              [facility] PT Assessment - 05/09/20 0001      AROM   Left Shoulder Flexion 116 Degrees    Left Shoulder ABduction 66 Degrees    Left Shoulder Internal Rotation --   reach to sacrum   Left Shoulder External Rotation --   reach left occiput     PROM   Left Shoulder Flexion 125 Degrees    Left Shoulder ABduction 115 Degrees    Left Shoulder External Rotation 65 Degrees                         [facility] Adult PT Treatment/Exercise - 05/09/20 0001      Shoulder Exercises: Standing   External Rotation AROM;Strengthening;Left;20 reps    Theraband Level (Shoulder External Rotation) Level 2 (Red);Level 3 (Green)    Internal Rotation AROM;Strengthening;Left;20 reps    Theraband Level (Shoulder Internal Rotation) Level 3 (Green)    Flexion Limitations [place] flexion green  band 2x10 left side  Extension AROM;Strengthening;Both;20 reps    Theraband Level (Shoulder Extension) Level 3 (Green)    Row AROM;Strengthening;Both;20 reps    Theraband Level (Shoulder Row) Level 3 (Green)    Other Standing Exercises wall slides with pillow case  into flexion x 20 , scaption x 10      Shoulder Exercises: Pulleys   Flexion 2 minutes    Scaption 2 minutes      Manual Therapy   Joint Mobilization Left GH mobilization AP and caudal glides grade I-III    Passive ROM Left shoulder 4-way PROM                    PT Short Term Goals - 05/09/20 1049      PT SHORT TERM GOAL #1   Title Independent with initial HEP    Time 4    Period Weeks    Status Achieved      PT SHORT TERM GOAL #2   Title Increase left shoulder AROM to at least 90 deg or greater  to work towards improved reaching ability for ADLs such as grooming    Baseline flexion 116 deg, abduction <90    Status Partially Met             PT Long Term Goals - 04/09/20 1440      PT LONG TERM GOAL #1   Title Improve FOTO outcome measure score to 37% or less impairment    Baseline 45% limited    Time 6    Period Weeks    Status On-going    Target Date 05/21/20      PT LONG TERM GOAL #2   Title Left shoulder AROM grossly WFL to dress and bathe with left UE use    Baseline ongoing-still limited by stiffness, see objective    Time 6    Period Weeks    Status On-going    Target Date 05/21/20      PT LONG TERM GOAL #3   Title Left shoulder strength grossly 5/5 to improve ability for lifting activities and to assist return to work pending MD clearance    Baseline see objective-ongoing    Time 6    Period Weeks    Status On-going    Target Date 05/21/20      PT LONG TERM GOAL #4   Title Perform reaching ADLs including dressing, bathing, chores as needed with left shoulder pain 3/10 or less    Baseline 5/10 04/09/20    Time 6    Period Weeks    Status On-going    Target Date 05/21/20                 Plan - 05/09/20 1008    Clinical Impression Statement Pt reports compliance with theraband portion of HEP 2 x per week. He is not stretching. AROM slightly improved for flexion. He reports pain and difficulty with reaching into refrigerator and with lifting arm for photography. Education provided on the need for continued stretching to maximize available ROM and strength. Reviewed Theraband HEP and performed wall slides and pulleys following manual joint mobs and PROM.    PT Next Visit Plan awaiting further visit approval from [place], continue shoulder stretching/PROM, AROM and strengthening progression and manual prn    PT Home Exercise Plan Access code: [email or web address]           Patient will benefit from skilled therapeutic intervention in order to improve the following  deficits and impairments:  Pain,Impaired UE functional use,Decreased strength,Decreased range of motion,Hypomobility  Visit Diagnosis: Incomplete tear of left rotator cuff, unspecified whether traumatic  Chronic left shoulder pain  Stiffness of left shoulder, not elsewhere classified  Muscle weakness (generalized)     Problem List Patient Active Problem List   Diagnosis Date Noted  . Left carpal tunnel syndrome 12/27/2019  . Adhesive capsulitis of left shoulder 12/27/2019  . Tendinopathy of left biceps 12/27/2019  . Hyperlipidemia with target LDL less than 100 06/23/2012  . Family history of heart disease in male family member before age 13 06/23/2012  . Current smoker 06/23/2012    Dorene Ar, PTA 05/09/2020, 11:03 AM  [facility] [address] [place], [place], [address] Phone: [phone]   Fax:  [phone]  Name: Oscar Myers MRN: [id number] Date of Birth: 1956-05-02

## 2020-05-28 ENCOUNTER — Encounter: Payer: Self-pay | Admitting: Physical Therapy

## 2020-05-28 ENCOUNTER — Ambulatory Visit: Payer: No Typology Code available for payment source | Admitting: Physical Therapy

## 2020-05-28 ENCOUNTER — Other Ambulatory Visit: Payer: Self-pay

## 2020-05-28 DIAGNOSIS — M75112 Incomplete rotator cuff tear or rupture of left shoulder, not specified as traumatic: Secondary | ICD-10-CM | POA: Diagnosis not present

## 2020-05-28 DIAGNOSIS — G8929 Other chronic pain: Secondary | ICD-10-CM

## 2020-05-28 DIAGNOSIS — M25512 Pain in left shoulder: Secondary | ICD-10-CM

## 2020-05-28 DIAGNOSIS — M25612 Stiffness of left shoulder, not elsewhere classified: Secondary | ICD-10-CM

## 2020-05-28 DIAGNOSIS — M6281 Muscle weakness (generalized): Secondary | ICD-10-CM

## 2020-05-28 NOTE — Therapy (Signed)
[place], [place], [address] Phone: [phone]   Fax:  [phone]  Physical Therapy Treatment/Recertification  Patient Details  Name: Oscar Myers MRN: [id number] Date of Birth: [birth year] Referring Provider (PT): Dwana Melena, PA-C (surgery by Frankey Shown, MD)   Encounter Date: 05/28/2020   PT End of Session - 05/28/20 0954    Visit Number 18    Number of Visits 30    Date for PT Re-Evaluation 07/09/20    Authorization Type [facility]    Authorization Time Period 11/03/19-05/01/20, 04/30/20-08/29/19    Authorization - Visit Number 2    Authorization - Number of Visits 15    PT Start Time 0844    PT Stop Time 0929    PT Time Calculation (min) 45 min    Activity Tolerance Patient tolerated treatment well    Behavior During Therapy [facility] for tasks assessed/performed           Past Medical History:  Diagnosis Date  . CTS (carpal tunnel syndrome)   . Diabetes mellitus without complication ([place])   . Dyslipidemia     Past Surgical History:  Procedure Laterality Date  . BACK SURGERY    . CARPAL TUNNEL RELEASE Left 04/30/2016   Procedure: LEFT CARPAL TUNNEL RELEASE ENDOSCOPIC;  Surgeon: Renette Butters, MD;  Location: [place];  Service: Orthopedics;  Laterality: Left;  . CARPAL TUNNEL RELEASE Left 01/10/2020   Procedure: LEFT CARPAL TUNNEL RELEASE;  Surgeon: Leandrew Koyanagi, MD;  Location: [place];  Service: Orthopedics;  Laterality: Left;  . COLONOSCOPY  2012  . SHOULDER ARTHROSCOPY WITH ROTATOR CUFF REPAIR AND SUBACROMIAL DECOMPRESSION Left 01/10/2020   Procedure: LEFT SHOULDER ARTHROSCOPY WITH EXTENSIVE DEBRIDEMENT, SUBACROMIAL DECOMPRESSION, DISTAL CLAVICLE EXCISION, MANIPULATION UNDER ANESTHESIA;  Surgeon: Leandrew Koyanagi, MD;  Location: [place];  Service: Orthopedics;  Laterality: Left;    There were no vitals filed for this visit.   Subjective  Assessment - 05/28/20 1051    Subjective Still having moderate level of shoulder pain as well as stiffness and weakness limiting overhead reaching with left UE in addition to tightness reaching behind back with left UE but does note gradual improvement from previous status. See assessment/plan.              [facility] PT Assessment - 05/28/20 0001      Observation/Other Assessments   Focus on Therapeutic Outcomes (FOTO)  43% limited      AROM   Left Shoulder Flexion 116 Degrees    Left Shoulder ABduction 80 Degrees    Left Shoulder Internal Rotation --   reach to T12   Left Shoulder External Rotation --   reach left occiput     PROM   Left Shoulder Flexion 125 Degrees    Left Shoulder ABduction 90 Degrees    Left Shoulder Internal Rotation 60 Degrees    Left Shoulder External Rotation 50 Degrees      Strength   Left Shoulder Flexion 4/5    Left Shoulder ABduction 4/5    Left Shoulder Internal Rotation 5/5    Left Shoulder External Rotation 4/5                         [facility] Adult PT Treatment/Exercise - 05/28/20 0001      Shoulder Exercises: Sidelying   ABduction AROM;Strengthening;Left;20 reps    ABduction Weight (lbs) 1    ABduction Limitations scaption  Shoulder Exercises: Standing   External Rotation AROM;Strengthening;Both;20 reps    Theraband Level (Shoulder External Rotation) Level 2 (Red)      Shoulder Exercises: ROM/Strengthening   UBE (Upper Arm Bike) L1 x 2 min ea. fw/rev      Manual Therapy   Soft tissue mobilization left posterior shoulder    Passive ROM Left shoulder 4-way PROM            Trigger Point Dry Needling - 05/28/20 0001    Consent Given? Yes    Education Handout Provided Yes    Muscles Treated Upper Quadrant Supraspinatus;Infraspinatus;Deltoid;Subscapularis;Teres minor    Dry Needling Comments subscapularis needled in supine otherwise other muscles noted needled for left shoulder in right sidelying position with 30-32  gauge 30 and 50 mm needles    Electrical Stimulation Performed with Dry Needling Yes    E-stim with Dry Needling Details TENS 2 pps x 10 minutes                PT Education - 05/28/20 1203    Education Details POC, status for ROM, dry needling    Person(s) Educated Patient    Methods Explanation;Handout    Comprehension Verbalized understanding            PT Short Term Goals - 05/28/20 1256      PT SHORT TERM GOAL #1   Title Independent with initial HEP    Baseline met    Time 4    Period Weeks    Status Achieved      PT SHORT TERM GOAL #2   Title Increase left shoulder AROM to at least 90 deg or greater to work towards improved reaching ability for ADLs such as grooming    Baseline flexion 116 deg, abduction <90    Time 4    Period Weeks    Status Partially Met             PT Long Term Goals - 05/28/20 1257      PT LONG TERM GOAL #1   Title Improve FOTO outcome measure score to 37% or less impairment    Baseline 43% limited    Time 6    Period Weeks    Status On-going    Target Date 07/09/20      PT LONG TERM GOAL #2   Title Left shoulder AROM grossly WFL to dress and bathe with left UE use    Baseline ongoing-still limited by stiffness, see objective    Time 6    Period Weeks    Status On-going    Target Date 07/09/20      PT LONG TERM GOAL #3   Title Left shoulder strength grossly 5/5 to improve ability for lifting activities and to assist return to work pending MD clearance    Baseline see objective-ongoing    Time 6    Period Weeks    Status On-going    Target Date 07/09/20      PT LONG TERM GOAL #4   Title Perform reaching ADLs including dressing, bathing, chores as needed with left shoulder pain 3/10 or less    Baseline ongoing, pain>3/10    Time 6    Period Weeks    Status On-going    Target Date 07/09/20                 Plan - 05/28/20 1204    Clinical Impression Statement Pt. continues to make gradual progress with  therapy with  recent return after absence from therapy while awaiting further visit approval for insurance. Still stiff reaching behind back with left UE but ROM for this improving from previous status (improved from reaching to sacrum to reaching to T12). Motion into shoulder abduction remains the most limited with a combination of muscle weakness with stiffness limiting both AROM/PROM. Today included a trial of dry needling to help address myofascial component of symptoms with muscle pain and tightness which was well-tolerated but will await further tx. response to assess potential benefit. Pt. would benefit from continued PT for further progress to address functional limitations associated with left UE use.    Personal Factors and Comorbidities Other    Examination-Activity Limitations Lift;Hygiene/Grooming;Bathing;Dressing;Sleep;Bed Mobility;Carry;Reach Overhead    Examination-Participation Restrictions Driving;Meal Prep;Laundry;Cleaning;Occupation    Stability/Clinical Decision Making Stable/Uncomplicated    Clinical Decision Making Low    Rehab Potential Good    PT Frequency --   2-3x/week   PT Duration 6 weeks    PT Treatment/Interventions ADLs/Self Care Home Management;Cryotherapy;Electrical Stimulation;Moist Heat;Therapeutic activities;Therapeutic exercise;Patient/family education;Manual techniques;Passive range of motion;Dry needling;Neuromuscular re-education;Taping;Vasopneumatic Device    PT Next Visit Plan Check response dry needling, continue manual for shoulder tightness as needed otherwise progess AROM and strengthening as tolerated    PT Home Exercise Plan Access code: [email or web address]    Consulted and Agree with Plan of Care Patient           Patient will benefit from skilled therapeutic intervention in order to improve the following deficits and impairments:  Pain,Impaired UE functional use,Decreased strength,Decreased range of motion,Hypomobility  Visit Diagnosis: Incomplete tear of  left rotator cuff, unspecified whether traumatic  Chronic left shoulder pain  Stiffness of left shoulder, not elsewhere classified  Muscle weakness (generalized)     Problem List Patient Active Problem List   Diagnosis Date Noted  . Left carpal tunnel syndrome 12/27/2019  . Adhesive capsulitis of left shoulder 12/27/2019  . Tendinopathy of left biceps 12/27/2019  . Hyperlipidemia with target LDL less than 100 06/23/2012  . Family history of heart disease in male family member before age 35 06/23/2012  . Current smoker 06/23/2012    Beaulah Dinning, PT, DPT 05/28/20 12:59 PM  [facility] [address] [place], [place], [address] Phone: [phone]   Fax:  [phone]  Name: Oscar Myers MRN: [id number] Date of Birth: 1956-02-08

## 2020-05-28 NOTE — Patient Instructions (Signed)

## 2020-05-30 ENCOUNTER — Ambulatory Visit: Payer: No Typology Code available for payment source | Admitting: Physical Therapy

## 2020-05-30 ENCOUNTER — Encounter: Payer: Self-pay | Admitting: Physical Therapy

## 2020-05-30 ENCOUNTER — Other Ambulatory Visit: Payer: Self-pay

## 2020-05-30 DIAGNOSIS — M75112 Incomplete rotator cuff tear or rupture of left shoulder, not specified as traumatic: Secondary | ICD-10-CM | POA: Diagnosis not present

## 2020-05-30 DIAGNOSIS — M6281 Muscle weakness (generalized): Secondary | ICD-10-CM

## 2020-05-30 DIAGNOSIS — M25512 Pain in left shoulder: Secondary | ICD-10-CM

## 2020-05-30 DIAGNOSIS — M25612 Stiffness of left shoulder, not elsewhere classified: Secondary | ICD-10-CM

## 2020-05-30 DIAGNOSIS — G8929 Other chronic pain: Secondary | ICD-10-CM

## 2020-05-31 ENCOUNTER — Encounter: Payer: Self-pay | Admitting: Physical Therapy

## 2020-05-31 NOTE — Therapy (Signed)
[place], [place], [address] Phone: [phone]   Fax:  [phone]  Physical Therapy Treatment  Patient Details  Name: Oscar Myers MRN: [id number]  Date of Birth: 30-Jan-1956 Referring Provider (PT): Dwana Melena, PA-C (surgery by Frankey Shown, MD)   Encounter Date: 05/30/2020   PT End of Session - 05/30/20 1327    Visit Number 19    Number of Visits 30    Date for PT Re-Evaluation 07/09/20    Authorization Type VA    Authorization Time Period 11/03/19-05/01/20, 04/30/20-08/29/19    Authorization - Visit Number 3    Authorization - Number of Visits 15    PT Start Time 9242    PT Stop Time 1056    PT Time Calculation (min) 41 min    Activity Tolerance Patient tolerated treatment well    Behavior During Therapy [facility] for tasks assessed/performed           Past Medical History:  Diagnosis Date  . CTS (carpal tunnel syndrome)   . Diabetes mellitus without complication ([place])   . Dyslipidemia     Past Surgical History:  Procedure Laterality Date  . BACK SURGERY    . CARPAL TUNNEL RELEASE Left 04/30/2016   Procedure: LEFT CARPAL TUNNEL RELEASE ENDOSCOPIC;  Surgeon: Renette Butters, MD;  Location: [place];  Service: Orthopedics;  Laterality: Left;  . CARPAL TUNNEL RELEASE Left 01/10/2020   Procedure: LEFT CARPAL TUNNEL RELEASE;  Surgeon: Leandrew Koyanagi, MD;  Location: [place];  Service: Orthopedics;  Laterality: Left;  . COLONOSCOPY  2012  . SHOULDER ARTHROSCOPY WITH ROTATOR CUFF REPAIR AND SUBACROMIAL DECOMPRESSION Left 01/10/2020   Procedure: LEFT SHOULDER ARTHROSCOPY WITH EXTENSIVE DEBRIDEMENT, SUBACROMIAL DECOMPRESSION, DISTAL CLAVICLE EXCISION, MANIPULATION UNDER ANESTHESIA;  Surgeon: Leandrew Koyanagi, MD;  Location: [place];  Service: Orthopedics;  Laterality: Left;    There were no vitals filed for this visit.   Subjective Assessment - 05/30/20  1016    Subjective Patient reports the pain has been better. He has had mild pain while using it but for the most part it has been better.    Pertinent History remote history of prevoius left shoulder surgery in the late 1970s, pt. also has right shoulder pain issues as well    Limitations House hold activities;Lifting    Diagnostic tests X-rays    Patient Stated Goals Get shoulder pain, return to work    Currently in Pain? Yes    Pain Score 4     Pain Location Shoulder    Pain Orientation Left    Pain Descriptors / Indicators Aching    Pain Type Chronic pain    Pain Onset More than a month ago    Pain Frequency Constant    Aggravating Factors  reaching    Pain Relieving Factors not reaching    Effect of Pain on Daily Activities limits ability to reach                             [facility] Adult PT Treatment/Exercise - 05/31/20 0001      Shoulder Exercises: Sidelying   External Rotation Limitations 20 2lb      Shoulder Exercises: Standing   External Rotation AROM;Strengthening;Both;20 reps    Theraband Level (Shoulder External Rotation) Level 2 (Red)    Internal Rotation AROM;Strengthening;Left;20 reps    Theraband Level (Shoulder Internal Rotation) Level  3 (Green)    Other Standing Exercises ex-ball wall walkx20; UE ranger into end range x20      Shoulder Exercises: ROM/Strengthening   UBE (Upper Arm Bike) L1 x 2 min ea. fw/rev      Shoulder Exercises: [employer] Limitations x30 each handle 20 lbs and 25 lbs    Other [employer] Exercises Lat pull down x30 25 lbs      Manual Therapy   Joint Mobilization Left GH mobilization AP and caudal glides grade I-III    Soft tissue mobilization left posterior shoulder    Passive ROM Left shoulder 4-way PROM                  PT Education - 05/30/20 1018    Education Details HEP and symptom mangement    Person(s) Educated Patient    Methods Explanation;Demonstration;Tactile cues;Verbal cues     Comprehension Verbalized understanding;Returned demonstration;Verbal cues required;Tactile cues required            PT Short Term Goals - 05/28/20 1256      PT SHORT TERM GOAL #1   Title Independent with initial HEP    Baseline met    Time 4    Period Weeks    Status Achieved      PT SHORT TERM GOAL #2   Title Increase left shoulder AROM to at least 90 deg or greater to work towards improved reaching ability for ADLs such as grooming    Baseline flexion 116 deg, abduction <90    Time 4    Period Weeks    Status Partially Met             PT Long Term Goals - 05/28/20 1257      PT LONG TERM GOAL #1   Title Improve FOTO outcome measure score to 37% or less impairment    Baseline 43% limited    Time 6    Period Weeks    Status On-going    Target Date 07/09/20      PT LONG TERM GOAL #2   Title Left shoulder AROM grossly WFL to dress and bathe with left UE use    Baseline ongoing-still limited by stiffness, see objective    Time 6    Period Weeks    Status On-going    Target Date 07/09/20      PT LONG TERM GOAL #3   Title Left shoulder strength grossly 5/5 to improve ability for lifting activities and to assist return to work pending MD clearance    Baseline see objective-ongoing    Time 6    Period Weeks    Status On-going    Target Date 07/09/20      PT LONG TERM GOAL #4   Title Perform reaching ADLs including dressing, bathing, chores as needed with left shoulder pain 3/10 or less    Baseline ongoing, pain>3/10    Time 6    Period Weeks    Status On-going    Target Date 07/09/20                 Plan - 05/31/20 0800    Clinical Impression Statement Patient is making progress. He got good benefit from the needling last visit. Therapy was able to advance him to light gym activity. He tolerated well. Therapy also added functional flexion with a cabinet reach. he fatiggued quickly but had no pain. His range improved with manual therapy. We will continue  to advance needling and fucntional strength as tolerated.    Personal Factors and Comorbidities Other    Examination-Activity Limitations Lift;Hygiene/Grooming;Bathing;Dressing;Sleep;Bed Mobility;Carry;Reach Overhead    Examination-Participation Restrictions Driving;Meal Prep;Laundry;Cleaning;Occupation    Stability/Clinical Decision Making Stable/Uncomplicated    Clinical Decision Making Low    Rehab Potential Good    PT Duration 6 weeks    PT Treatment/Interventions ADLs/Self Care Home Management;Cryotherapy;Electrical Stimulation;Moist Heat;Therapeutic activities;Therapeutic exercise;Patient/family education;Manual techniques;Passive range of motion;Dry needling;Neuromuscular re-education;Taping;Vasopneumatic Device    PT Next Visit Plan Check response dry needling, continue manual for shoulder tightness as needed otherwise progess AROM and strengthening as tolerated    PT Home Exercise Plan Access code: [email or web address]    Consulted and Agree with Plan of Care Patient           Patient will benefit from skilled therapeutic intervention in order to improve the following deficits and impairments:  Pain,Impaired UE functional use,Decreased strength,Decreased range of motion,Hypomobility  Visit Diagnosis: Incomplete tear of left rotator cuff, unspecified whether traumatic  Chronic left shoulder pain  Stiffness of left shoulder, not elsewhere classified  Muscle weakness (generalized)     Problem List Patient Active Problem List   Diagnosis Date Noted  . Left carpal tunnel syndrome 12/27/2019  . Adhesive capsulitis of left shoulder 12/27/2019  . Tendinopathy of left biceps 12/27/2019  . Hyperlipidemia with target LDL less than 100 06/23/2012  . Family history of heart disease in male family member before age 2 06/23/2012  . Current smoker 06/23/2012    Carney Living PT DPT  05/31/2020, 8:03 AM  [facility] [address] [place], [place], [address] Phone: [phone]   Fax:  [phone]  Name: Oscar Myers MRN: [id number] Date of Birth: [birth year]

## 2020-06-04 ENCOUNTER — Encounter: Payer: Self-pay | Admitting: Physical Therapy

## 2020-06-04 ENCOUNTER — Ambulatory Visit: Payer: No Typology Code available for payment source | Attending: Physician Assistant | Admitting: Physical Therapy

## 2020-06-04 ENCOUNTER — Other Ambulatory Visit: Payer: Self-pay

## 2020-06-04 DIAGNOSIS — M25612 Stiffness of left shoulder, not elsewhere classified: Secondary | ICD-10-CM | POA: Diagnosis present

## 2020-06-04 DIAGNOSIS — M75112 Incomplete rotator cuff tear or rupture of left shoulder, not specified as traumatic: Secondary | ICD-10-CM | POA: Insufficient documentation

## 2020-06-04 DIAGNOSIS — M25512 Pain in left shoulder: Secondary | ICD-10-CM | POA: Insufficient documentation

## 2020-06-04 DIAGNOSIS — G8929 Other chronic pain: Secondary | ICD-10-CM | POA: Insufficient documentation

## 2020-06-04 DIAGNOSIS — M6281 Muscle weakness (generalized): Secondary | ICD-10-CM | POA: Insufficient documentation

## 2020-06-04 NOTE — Therapy (Signed)
[place], [place], [address] Phone: [phone]   Fax:  [phone]  Physical Therapy Treatment  Patient Details  Name: Oscar Myers MRN: [id number] Date of Birth: [birth year] Referring Provider (PT): Dwana Melena, PA-C (surgery by Frankey Shown, MD)   Encounter Date: 06/04/2020   PT End of Session - 06/04/20 1035    Visit Number 20    Number of Visits 30    Date for PT Re-Evaluation 07/09/20    Authorization Time Period 11/03/19-05/01/20, 04/30/20-08/29/19    Authorization - Visit Number 4    Authorization - Number of Visits 15    PT Start Time 0934    PT Stop Time 1015    PT Time Calculation (min) 41 min    Activity Tolerance Patient tolerated treatment well    Behavior During Therapy [facility] for tasks assessed/performed           Past Medical History:  Diagnosis Date  . CTS (carpal tunnel syndrome)   . Diabetes mellitus without complication ([place])   . Dyslipidemia     Past Surgical History:  Procedure Laterality Date  . BACK SURGERY    . CARPAL TUNNEL RELEASE Left 04/30/2016   Procedure: LEFT CARPAL TUNNEL RELEASE ENDOSCOPIC;  Surgeon: Renette Butters, MD;  Location: [place];  Service: Orthopedics;  Laterality: Left;  . CARPAL TUNNEL RELEASE Left 01/10/2020   Procedure: LEFT CARPAL TUNNEL RELEASE;  Surgeon: Leandrew Koyanagi, MD;  Location: [place];  Service: Orthopedics;  Laterality: Left;  . COLONOSCOPY  2012  . SHOULDER ARTHROSCOPY WITH ROTATOR CUFF REPAIR AND SUBACROMIAL DECOMPRESSION Left 01/10/2020   Procedure: LEFT SHOULDER ARTHROSCOPY WITH EXTENSIVE DEBRIDEMENT, SUBACROMIAL DECOMPRESSION, DISTAL CLAVICLE EXCISION, MANIPULATION UNDER ANESTHESIA;  Surgeon: Leandrew Koyanagi, MD;  Location: [place];  Service: Orthopedics;  Laterality: Left;    There were no vitals filed for this visit.   Subjective Assessment - 06/04/20 0939    Subjective Had some  upper posterior shoulder pain yesterday but no significant pain this AM.    Currently in Pain? No/denies                             [facility] Adult PT Treatment/Exercise - 06/04/20 0001      Shoulder Exercises: Sidelying   ABduction Left;20 reps    ABduction Limitations scaption AROM      Shoulder Exercises: Standing   External Rotation AROM;Strengthening;20 reps;Left    Theraband Level (Shoulder External Rotation) Level 2 (Red)    Internal Rotation AROM;Strengthening;Left;20 reps    Theraband Level (Shoulder Internal Rotation) Level 3 (Green)    Flexion AROM;20 reps;Left    Shoulder Flexion Weight (lbs) 1    Flexion Limitations flexion to 90 deg 2x10      Shoulder Exercises: ROM/Strengthening   UBE (Upper Arm Bike) L4 x 2 min ea. fw/rev    Lat Pull Limitations 2x10 with 35 lbs. in standing    Cybex Row Limitations vertical grips 2x10 with 35 lbs.      Manual Therapy   Soft tissue mobilization left posterior shoulder            Trigger Point Dry Needling - 06/04/20 0001    Consent Given? Yes    Muscles Treated Upper Quadrant Supraspinatus;Infraspinatus;Deltoid;Teres minor    Dry Needling Comments needling in right sidelying with 32 gauge 50 mm needles    Electrical Stimulation Performed with  Dry Needling Yes    E-stim with Dry Needling Details TENS 2 pps x 10 minutes                  PT Short Term Goals - 05/28/20 1256      PT SHORT TERM GOAL #1   Title Independent with initial HEP    Baseline met    Time 4    Period Weeks    Status Achieved      PT SHORT TERM GOAL #2   Title Increase left shoulder AROM to at least 90 deg or greater to work towards improved reaching ability for ADLs such as grooming    Baseline flexion 116 deg, abduction <90    Time 4    Period Weeks    Status Partially Met             PT Long Term Goals - 05/28/20 1257      PT LONG TERM GOAL #1   Title Improve FOTO outcome measure score to 37% or less impairment     Baseline 43% limited    Time 6    Period Weeks    Status On-going    Target Date 07/09/20      PT LONG TERM GOAL #2   Title Left shoulder AROM grossly WFL to dress and bathe with left UE use    Baseline ongoing-still limited by stiffness, see objective    Time 6    Period Weeks    Status On-going    Target Date 07/09/20      PT LONG TERM GOAL #3   Title Left shoulder strength grossly 5/5 to improve ability for lifting activities and to assist return to work pending MD clearance    Baseline see objective-ongoing    Time 6    Period Weeks    Status On-going    Target Date 07/09/20      PT LONG TERM GOAL #4   Title Perform reaching ADLs including dressing, bathing, chores as needed with left shoulder pain 3/10 or less    Baseline ongoing, pain>3/10    Time 6    Period Weeks    Status On-going    Target Date 07/09/20                 Plan - 06/04/20 1035    Clinical Impression Statement Still most weak in abduction and ER motion but continues to make gradual progress with ROM and strength gains and improving activity tolerance for ADLs with left UE use. Continued exercise progressoin with light "gym" activity aling with continued Theraband and AROM exercises aling with dry needling given benefit noted from last performance of this with good tolerance excepting some soreness in abduction. Progress with LTGs still ongoing.    Personal Factors and Comorbidities Other    Examination-Activity Limitations Lift;Hygiene/Grooming;Bathing;Dressing;Sleep;Bed Mobility;Carry;Reach Overhead    Examination-Participation Restrictions Driving;Meal Prep;Laundry;Cleaning;Occupation    Stability/Clinical Decision Making Stable/Uncomplicated    Clinical Decision Making Low    Rehab Potential Good    PT Frequency --   2-3x/week   PT Duration 6 weeks    PT Next Visit Plan Continued exercise progression, further manual for PROM/joint mobs/STM as needed, furtther dry needling prn    PT Home  Exercise Plan Access code: [email or web address]    Consulted and Agree with Plan of Care Patient           Patient will benefit from skilled therapeutic intervention in order to improve the following deficits  and impairments:  Pain,Impaired UE functional use,Decreased strength,Decreased range of motion,Hypomobility  Visit Diagnosis: Incomplete tear of left rotator cuff, unspecified whether traumatic  Chronic left shoulder pain  Stiffness of left shoulder, not elsewhere classified  Muscle weakness (generalized)     Problem List Patient Active Problem List   Diagnosis Date Noted  . Left carpal tunnel syndrome 12/27/2019  . Adhesive capsulitis of left shoulder 12/27/2019  . Tendinopathy of left biceps 12/27/2019  . Hyperlipidemia with target LDL less than 100 06/23/2012  . Family history of heart disease in male family member before age 70 06/23/2012  . Current smoker 06/23/2012    Beaulah Dinning, PT, DPT 06/04/20 12:00 PM  [facility] [address] [place], [place], [address] Phone: [phone]   Fax:  [phone]  Name: Oscar Myers MRN: [id number] Date of Birth: 08/09/1955

## 2020-06-06 ENCOUNTER — Ambulatory Visit: Payer: No Typology Code available for payment source | Admitting: Physical Therapy

## 2020-06-06 ENCOUNTER — Encounter: Payer: Self-pay | Admitting: Physical Therapy

## 2020-06-06 ENCOUNTER — Other Ambulatory Visit: Payer: Self-pay

## 2020-06-06 DIAGNOSIS — M25612 Stiffness of left shoulder, not elsewhere classified: Secondary | ICD-10-CM

## 2020-06-06 DIAGNOSIS — G8929 Other chronic pain: Secondary | ICD-10-CM

## 2020-06-06 DIAGNOSIS — M6281 Muscle weakness (generalized): Secondary | ICD-10-CM

## 2020-06-06 DIAGNOSIS — M25512 Pain in left shoulder: Secondary | ICD-10-CM

## 2020-06-06 DIAGNOSIS — M75112 Incomplete rotator cuff tear or rupture of left shoulder, not specified as traumatic: Secondary | ICD-10-CM | POA: Diagnosis not present

## 2020-06-06 NOTE — Therapy (Signed)
[place], [place], [address] Phone: [phone]   Fax:  [phone]  Physical Therapy Treatment  Patient Details  Name: Oscar Myers MRN: [id number] Date of Birth: [birth year] Referring Provider (PT): Dwana Melena, PA-C (surgery by Frankey Shown, MD)   Encounter Date: 06/06/2020   PT End of Session - 06/06/20 0934    Visit Number 21    Number of Visits 30    Date for PT Re-Evaluation 07/09/20    Authorization Type [place]    Authorization Time Period 11/03/19-05/01/20, 04/30/20-08/29/19    Authorization - Visit Number 5    Authorization - Number of Visits 15    PT Start Time 0932    PT Stop Time 1011    PT Time Calculation (min) 39 min    Activity Tolerance Patient tolerated treatment well    Behavior During Therapy [facility] for tasks assessed/performed           Past Medical History:  Diagnosis Date  . CTS (carpal tunnel syndrome)   . Diabetes mellitus without complication ([place])   . Dyslipidemia     Past Surgical History:  Procedure Laterality Date  . BACK SURGERY    . CARPAL TUNNEL RELEASE Left 04/30/2016   Procedure: LEFT CARPAL TUNNEL RELEASE ENDOSCOPIC;  Surgeon: Renette Butters, MD;  Location: [place];  Service: Orthopedics;  Laterality: Left;  . CARPAL TUNNEL RELEASE Left 01/10/2020   Procedure: LEFT CARPAL TUNNEL RELEASE;  Surgeon: Leandrew Koyanagi, MD;  Location: [place];  Service: Orthopedics;  Laterality: Left;  . COLONOSCOPY  2012  . SHOULDER ARTHROSCOPY WITH ROTATOR CUFF REPAIR AND SUBACROMIAL DECOMPRESSION Left 01/10/2020   Procedure: LEFT SHOULDER ARTHROSCOPY WITH EXTENSIVE DEBRIDEMENT, SUBACROMIAL DECOMPRESSION, DISTAL CLAVICLE EXCISION, MANIPULATION UNDER ANESTHESIA;  Surgeon: Leandrew Koyanagi, MD;  Location: [place];  Service: Orthopedics;  Laterality: Left;    There were no vitals filed for this visit.   Subjective Assessment - 06/06/20  0934    Subjective A little sore after tx. earlier this week. No pain pre-tx. today.    Currently in Pain? No/denies                             [facility] Adult PT Treatment/Exercise - 06/06/20 0001      Shoulder Exercises: Supine   Horizontal ABduction AROM;Strengthening;Both;20 reps    Theraband Level (Shoulder Horizontal ABduction) Level 2 (Red)    Other Supine Exercises thythmic stabilization at 90 deg flexion 20 sec x 3      Shoulder Exercises: Sidelying   External Rotation AROM;Strengthening;Left;20 reps    External Rotation Weight (lbs) 1    ABduction Left;20 reps    ABduction Limitations scaption AROM      Shoulder Exercises: Standing   External Rotation AROM;Strengthening;20 reps;Left    Theraband Level (Shoulder External Rotation) Level 2 (Red)    Internal Rotation AROM;Strengthening;Left;20 reps    Theraband Level (Shoulder Internal Rotation) Level 3 (Green)    Flexion AROM;20 reps;Left    Shoulder Flexion Weight (lbs) 1    Flexion Limitations flexion to 90 deg 2x10    Other Standing Exercises wall push up with a plus    Other Standing Exercises small ball circles at wall CW/CCW x 15 ea.      Shoulder Exercises: ROM/Strengthening   UBE (Upper Arm Bike) L4 x 2 min ea. fw/rev    Lat Pull Limitations 2x10  with 35 lbs. in standing   underhand grip   Cybex Row Limitations horizontal   grips with 35 lbs. 2x10   also x20 with vertical grips     Manual Therapy   Joint Mobilization Grade I-III GH mobilization AP and caudal glides    Soft tissue mobilization left posterior shoulder and subscapularis    Passive ROM left shoulder 4-way PROM                    PT Short Term Goals - 05/28/20 1256      PT SHORT TERM GOAL #1   Title Independent with initial HEP    Baseline met    Time 4    Period Weeks    Status Achieved      PT SHORT TERM GOAL #2   Title Increase left shoulder AROM to at least 90 deg or greater to work towards improved reaching  ability for ADLs such as grooming    Baseline flexion 116 deg, abduction <90    Time 4    Period Weeks    Status Partially Met             PT Long Term Goals - 05/28/20 1257      PT LONG TERM GOAL #1   Title Improve FOTO outcome measure score to 37% or less impairment    Baseline 43% limited    Time 6    Period Weeks    Status On-going    Target Date 07/09/20      PT LONG TERM GOAL #2   Title Left shoulder AROM grossly WFL to dress and bathe with left UE use    Baseline ongoing-still limited by stiffness, see objective    Time 6    Period Weeks    Status On-going    Target Date 07/09/20      PT LONG TERM GOAL #3   Title Left shoulder strength grossly 5/5 to improve ability for lifting activities and to assist return to work pending MD clearance    Baseline see objective-ongoing    Time 6    Period Weeks    Status On-going    Target Date 07/09/20      PT LONG TERM GOAL #4   Title Perform reaching ADLs including dressing, bathing, chores as needed with left shoulder pain 3/10 or less    Baseline ongoing, pain>3/10    Time 6    Period Weeks    Status On-going    Target Date 07/09/20                 Plan - 06/06/20 1517    Clinical Impression Statement More exercise progression focus today with additional scapular stabilization added. Some muscle fatigue and soreness but overall session well-tolerated. As previously pt. continues with gradual progress for left shoulder ROM and strength gains with LTG progress ongoing.    Personal Factors and Comorbidities Other    Examination-Activity Limitations Lift;Hygiene/Grooming;Bathing;Dressing;Sleep;Bed Mobility;Carry;Reach Overhead    Examination-Participation Restrictions Driving;Meal Prep;Laundry;Cleaning;Occupation    Stability/Clinical Decision Making Stable/Uncomplicated    Clinical Decision Making Low    Rehab Potential Good    PT Frequency --   2-3x/week   PT Duration 6 weeks    PT Treatment/Interventions  ADLs/Self Care Home Management;Cryotherapy;Electrical Stimulation;Moist Heat;Therapeutic activities;Therapeutic exercise;Patient/family education;Manual techniques;Passive range of motion;Dry needling;Neuromuscular re-education;Taping;Vasopneumatic Device    PT Next Visit Plan Continued exercise progression, further manual for PROM/joint mobs/STM as needed, furtther dry needling prn  PT Home Exercise Plan Access code: [email or web address]    Consulted and Agree with Plan of Care Patient           Patient will benefit from skilled therapeutic intervention in order to improve the following deficits and impairments:  Pain,Impaired UE functional use,Decreased strength,Decreased range of motion,Hypomobility  Visit Diagnosis: Incomplete tear of left rotator cuff, unspecified whether traumatic  Chronic left shoulder pain  Stiffness of left shoulder, not elsewhere classified  Muscle weakness (generalized)     Problem List Patient Active Problem List   Diagnosis Date Noted  . Left carpal tunnel syndrome 12/27/2019  . Adhesive capsulitis of left shoulder 12/27/2019  . Tendinopathy of left biceps 12/27/2019  . Hyperlipidemia with target LDL less than 100 06/23/2012  . Family history of heart disease in male family member before age 61 06/23/2012  . Current smoker 06/23/2012   Beaulah Dinning, PT, DPT 06/06/20 10:13 AM  [facility] [address] [place], [place], [address] Phone: [phone]   Fax:  [phone]  Name: Oscar Myers MRN: [id number] Date of Birth: 04/30/1956

## 2020-06-11 ENCOUNTER — Encounter: Payer: Self-pay | Admitting: Physical Therapy

## 2020-06-11 ENCOUNTER — Other Ambulatory Visit: Payer: Self-pay

## 2020-06-11 ENCOUNTER — Ambulatory Visit: Payer: No Typology Code available for payment source | Admitting: Physical Therapy

## 2020-06-11 DIAGNOSIS — M25512 Pain in left shoulder: Secondary | ICD-10-CM

## 2020-06-11 DIAGNOSIS — G8929 Other chronic pain: Secondary | ICD-10-CM

## 2020-06-11 DIAGNOSIS — M6281 Muscle weakness (generalized): Secondary | ICD-10-CM

## 2020-06-11 DIAGNOSIS — M75112 Incomplete rotator cuff tear or rupture of left shoulder, not specified as traumatic: Secondary | ICD-10-CM

## 2020-06-11 DIAGNOSIS — M25612 Stiffness of left shoulder, not elsewhere classified: Secondary | ICD-10-CM

## 2020-06-11 NOTE — Therapy (Signed)
[place], [place], [address] Phone: [phone]   Fax:  [phone]  Physical Therapy Treatment  Patient Details  Name: Oscar Myers MRN: [id number] Date of Birth: 09/03/1955 Referring Provider (PT): Dwana Melena, PA-C (surgery by Frankey Shown, MD)   Encounter Date: 06/11/2020   PT End of Session - 06/11/20 0928    Visit Number 22    Number of Visits 30    Date for PT Re-Evaluation 07/09/20    Authorization Type VA    Authorization Time Period 11/03/19-05/01/20, 04/30/20-08/29/19    Authorization - Visit Number 6    Authorization - Number of Visits 15    PT Start Time 0924    PT Stop Time 1008    PT Time Calculation (min) 44 min    Activity Tolerance Patient tolerated treatment well    Behavior During Therapy [facility] for tasks assessed/performed           Past Medical History:  Diagnosis Date  . CTS (carpal tunnel syndrome)   . Diabetes mellitus without complication ([place])   . Dyslipidemia     Past Surgical History:  Procedure Laterality Date  . BACK SURGERY    . CARPAL TUNNEL RELEASE Left 04/30/2016   Procedure: LEFT CARPAL TUNNEL RELEASE ENDOSCOPIC;  Surgeon: Renette Butters, MD;  Location: [place];  Service: Orthopedics;  Laterality: Left;  . CARPAL TUNNEL RELEASE Left 01/10/2020   Procedure: LEFT CARPAL TUNNEL RELEASE;  Surgeon: Leandrew Koyanagi, MD;  Location: [place];  Service: Orthopedics;  Laterality: Left;  . COLONOSCOPY  2012  . SHOULDER ARTHROSCOPY WITH ROTATOR CUFF REPAIR AND SUBACROMIAL DECOMPRESSION Left 01/10/2020   Procedure: LEFT SHOULDER ARTHROSCOPY WITH EXTENSIVE DEBRIDEMENT, SUBACROMIAL DECOMPRESSION, DISTAL CLAVICLE EXCISION, MANIPULATION UNDER ANESTHESIA;  Surgeon: Leandrew Koyanagi, MD;  Location: [place];  Service: Orthopedics;  Laterality: Left;    There were no vitals filed for this visit.   Subjective Assessment - 06/11/20  0926    Subjective Left shoulder pain 5/10 today. Some continued soreness extending from left posterior scapular region around into anterolateral shoulder which has been ongoing. Of note pt. also reports has been having issues with headaches/neck pain and is potentially interested in therapy for this-discussed he would mention at MD follow up and check on getting therapy referral for this.    Currently in Pain? Yes    Pain Score 5     Pain Location Shoulder    Pain Orientation Left    Pain Descriptors / Indicators Sharp    Pain Type Chronic pain    Pain Onset More than a month ago    Pain Frequency Constant    Aggravating Factors  no aggs/eases noted    Effect of Pain on Daily Activities impacts ability reaching ADLs              [facility] PT Assessment - 06/11/20 0001      Special Tests   Other special tests cervical flexion rotation test (+) for headache                         [facility] Adult PT Treatment/Exercise - 06/11/20 0001      Shoulder Exercises: Supine   Protraction Limitations serratuspunch 2 lbs. 2x10    Horizontal ABduction AROM;Strengthening;Both;20 reps    Theraband Level (Shoulder Horizontal ABduction) Level 2 (Red)    Flexion AROM;Strengthening;Left;20 reps    Shoulder Flexion Weight (  lbs) 1    Other Supine Exercises supine rhythmic stabilization at 90 deg flexion 3x20 sec      Shoulder Exercises: Sidelying   External Rotation AROM;Strengthening;Left;20 reps    External Rotation Weight (lbs) 1    ABduction Limitations 3x10 left scaption with cues for hand position and angle ROM      Shoulder Exercises: Standing   External Rotation Limitations Freemtoin cable 3 lbs. 2x10    Internal Rotation Limitations 2x10 Freemotion cable with 7 lbs.      Shoulder Exercises: ROM/Strengthening   Nustep L5 x 5 min UE/LE   UBE in use   Lat Pull Limitations 2x10 with 35 lbs. in standing   underhand grip   Cybex Row Limitations horizontal   grips with 35 lbs. 2x10    also x20 with vertical grips     Manual Therapy   Joint Mobilization Grade I-III GH mobilization AP and caudal glides    Soft tissue mobilization left posterior shoulder and subscapularis    Passive ROM left shoulder 4-way PROM                    PT Short Term Goals - 05/28/20 1256      PT SHORT TERM GOAL #1   Title Independent with initial HEP    Baseline met    Time 4    Period Weeks    Status Achieved      PT SHORT TERM GOAL #2   Title Increase left shoulder AROM to at least 90 deg or greater to work towards improved reaching ability for ADLs such as grooming    Baseline flexion 116 deg, abduction <90    Time 4    Period Weeks    Status Partially Met             PT Long Term Goals - 05/28/20 1257      PT LONG TERM GOAL #1   Title Improve FOTO outcome measure score to 37% or less impairment    Baseline 43% limited    Time 6    Period Weeks    Status On-going    Target Date 07/09/20      PT LONG TERM GOAL #2   Title Left shoulder AROM grossly WFL to dress and bathe with left UE use    Baseline ongoing-still limited by stiffness, see objective    Time 6    Period Weeks    Status On-going    Target Date 07/09/20      PT LONG TERM GOAL #3   Title Left shoulder strength grossly 5/5 to improve ability for lifting activities and to assist return to work pending MD clearance    Baseline see objective-ongoing    Time 6    Period Weeks    Status On-going    Target Date 07/09/20      PT LONG TERM GOAL #4   Title Perform reaching ADLs including dressing, bathing, chores as needed with left shoulder pain 3/10 or less    Baseline ongoing, pain>3/10    Time 6    Period Weeks    Status On-going    Target Date 07/09/20                 Plan - 06/11/20 1010    Clinical Impression Statement Pt. deferred dry needling today so continued more exercise focus for shoulder strengthening-still most weak in abduction>flexion but gradually improving with  strength/ROM. Checked cervical flexion rotation test to  assess for potential cervicogenic headache which was (+) and will await PT referral from next MD follow up/visit approval for this.    Personal Factors and Comorbidities Other    Examination-Activity Limitations Lift;Hygiene/Grooming;Bathing;Dressing;Sleep;Bed Mobility;Carry;Reach Overhead    Examination-Participation Restrictions Driving;Meal Prep;Laundry;Cleaning;Occupation    Stability/Clinical Decision Making Stable/Uncomplicated    Clinical Decision Making Moderate    Rehab Potential Good    PT Frequency --   2-3x/week   PT Duration 6 weeks    PT Treatment/Interventions ADLs/Self Care Home Management;Cryotherapy;Electrical Stimulation;Moist Heat;Therapeutic activities;Therapeutic exercise;Patient/family education;Manual techniques;Passive range of motion;Dry needling;Neuromuscular re-education;Taping;Vasopneumatic Device    PT Next Visit Plan Continued exercise progression, further manual for PROM/joint mobs/STM as needed, furtther dry needling prn    PT Home Exercise Plan Access code: [email or web address]    Consulted and Agree with Plan of Care Patient           Patient will benefit from skilled therapeutic intervention in order to improve the following deficits and impairments:  Pain,Impaired UE functional use,Decreased strength,Decreased range of motion,Hypomobility  Visit Diagnosis: Incomplete tear of left rotator cuff, unspecified whether traumatic  Chronic left shoulder pain  Stiffness of left shoulder, not elsewhere classified  Muscle weakness (generalized)     Problem List Patient Active Problem List   Diagnosis Date Noted  . Left carpal tunnel syndrome 12/27/2019  . Adhesive capsulitis of left shoulder 12/27/2019  . Tendinopathy of left biceps 12/27/2019  . Hyperlipidemia with target LDL less than 100 06/23/2012  . Family history of heart disease in male family member before age 88 06/23/2012  . Current smoker  06/23/2012    Beaulah Dinning, PT, DPT 06/11/20 10:13 AM  [facility] [address] [place], [place], [address] Phone: [phone]   Fax:  [phone]  Name: Oscar Myers MRN: [id number] Date of Birth: 08-Jul-1955

## 2020-06-13 ENCOUNTER — Other Ambulatory Visit: Payer: Self-pay

## 2020-06-13 ENCOUNTER — Encounter: Payer: Self-pay | Admitting: Physical Therapy

## 2020-06-13 ENCOUNTER — Ambulatory Visit: Payer: No Typology Code available for payment source | Admitting: Physical Therapy

## 2020-06-13 DIAGNOSIS — M75112 Incomplete rotator cuff tear or rupture of left shoulder, not specified as traumatic: Secondary | ICD-10-CM

## 2020-06-13 DIAGNOSIS — M6281 Muscle weakness (generalized): Secondary | ICD-10-CM

## 2020-06-13 DIAGNOSIS — G8929 Other chronic pain: Secondary | ICD-10-CM

## 2020-06-13 DIAGNOSIS — M25612 Stiffness of left shoulder, not elsewhere classified: Secondary | ICD-10-CM

## 2020-06-13 DIAGNOSIS — M25512 Pain in left shoulder: Secondary | ICD-10-CM

## 2020-06-13 NOTE — Therapy (Signed)
[place], [place], [address] Phone: [phone]   Fax:  [phone]  Physical Therapy Treatment  Patient Details  Name: Oscar Myers MRN: [id number] Date of Birth: 02/18/1956 Referring Provider (PT): Dwana Melena, PA-C (surgery by Frankey Shown, MD)   Encounter Date: 06/13/2020   PT End of Session - 06/13/20 0933    Visit Number 23    Number of Visits 30    Date for PT Re-Evaluation 07/09/20    Authorization Type VA    Authorization Time Period 11/03/19-05/01/20, 04/30/20-08/29/19    Authorization - Visit Number 6    Authorization - Number of Visits 15    PT Start Time 0928    PT Stop Time 1010    PT Time Calculation (min) 42 min    Activity Tolerance Patient tolerated treatment well    Behavior During Therapy [facility] for tasks assessed/performed           Past Medical History:  Diagnosis Date  . CTS (carpal tunnel syndrome)   . Diabetes mellitus without complication ([place])   . Dyslipidemia     Past Surgical History:  Procedure Laterality Date  . BACK SURGERY    . CARPAL TUNNEL RELEASE Left 04/30/2016   Procedure: LEFT CARPAL TUNNEL RELEASE ENDOSCOPIC;  Surgeon: Renette Butters, MD;  Location: [place];  Service: Orthopedics;  Laterality: Left;  . CARPAL TUNNEL RELEASE Left 01/10/2020   Procedure: LEFT CARPAL TUNNEL RELEASE;  Surgeon: Leandrew Koyanagi, MD;  Location: [place];  Service: Orthopedics;  Laterality: Left;  . COLONOSCOPY  2012  . SHOULDER ARTHROSCOPY WITH ROTATOR CUFF REPAIR AND SUBACROMIAL DECOMPRESSION Left 01/10/2020   Procedure: LEFT SHOULDER ARTHROSCOPY WITH EXTENSIVE DEBRIDEMENT, SUBACROMIAL DECOMPRESSION, DISTAL CLAVICLE EXCISION, MANIPULATION UNDER ANESTHESIA;  Surgeon: Leandrew Koyanagi, MD;  Location: [place];  Service: Orthopedics;  Laterality: Left;    There were no vitals filed for this visit.   Subjective Assessment - 06/13/20  0931    Subjective Patiet's shoulder pain is about a 5/10 today. He can not recall a reason for the pain. he feels like some days it just hurts. he reports at this point its about 3-4 days a week when he has pain. his neck has been a little better.    Pertinent History remote history of prevoius left shoulder surgery in the late 1970s, pt. also has right shoulder pain issues as well    Limitations House hold activities;Lifting    Diagnostic tests X-rays    Patient Stated Goals Get shoulder pain, return to work    Currently in Pain? Yes    Pain Score 5     Pain Location Shoulder    Pain Orientation Left    Pain Descriptors / Indicators Aching    Pain Type Chronic pain    Pain Onset More than a month ago    Pain Frequency Constant    Aggravating Factors  no agressive PT noted    Pain Relieving Factors not reaching    Multiple Pain Sites No                             [facility] Adult PT Treatment/Exercise - 06/13/20 0001      Shoulder Exercises: Supine   Protraction Limitations serratuspunch 2 lbs. 2x10    Flexion AROM;Strengthening;Left;20 reps    Shoulder Flexion Weight (lbs) 1    Other Supine Exercises supine  rhythmic stabilization at 90 deg flexion 3x20 sec      Shoulder Exercises: Sidelying   External Rotation AROM;Strengthening;Left;20 reps    External Rotation Weight (lbs) 1    ABduction Limitations 3x10 left scaption with cues for hand position and angle ROM                   PT Education - 06/13/20 0933    Education Details reviewed stretches for home and ther-ex    Person(s) Educated Patient    Methods Explanation;Demonstration;Tactile cues;Verbal cues    Comprehension Verbalized understanding;Returned demonstration;Verbal cues required;Tactile cues required            PT Short Term Goals - 05/28/20 1256      PT SHORT TERM GOAL #1   Title Independent with initial HEP    Baseline met    Time 4    Period Weeks    Status Achieved      PT  SHORT TERM GOAL #2   Title Increase left shoulder AROM to at least 90 deg or greater to work towards improved reaching ability for ADLs such as grooming    Baseline flexion 116 deg, abduction <90    Time 4    Period Weeks    Status Partially Met             PT Long Term Goals - 05/28/20 1257      PT LONG TERM GOAL #1   Title Improve FOTO outcome measure score to 37% or less impairment    Baseline 43% limited    Time 6    Period Weeks    Status On-going    Target Date 07/09/20      PT LONG TERM GOAL #2   Title Left shoulder AROM grossly WFL to dress and bathe with left UE use    Baseline ongoing-still limited by stiffness, see objective    Time 6    Period Weeks    Status On-going    Target Date 07/09/20      PT LONG TERM GOAL #3   Title Left shoulder strength grossly 5/5 to improve ability for lifting activities and to assist return to work pending MD clearance    Baseline see objective-ongoing    Time 6    Period Weeks    Status On-going    Target Date 07/09/20      PT LONG TERM GOAL #4   Title Perform reaching ADLs including dressing, bathing, chores as needed with left shoulder pain 3/10 or less    Baseline ongoing, pain>3/10    Time 6    Period Weeks    Status On-going    Target Date 07/09/20                 Plan - 06/13/20 1637    Clinical Impression Statement Patient continues to have pain with overhead activity but he is able to work through it. Therapy continues to progress exercises as tolerated. His flexion was limited when he first started but improved with manual therapy.    Personal Factors and Comorbidities Other    Examination-Activity Limitations Lift;Hygiene/Grooming;Bathing;Dressing;Sleep;Bed Mobility;Carry;Reach Overhead    Examination-Participation Restrictions Driving;Meal Prep;Laundry;Cleaning;Occupation    Stability/Clinical Decision Making Stable/Uncomplicated    Clinical Decision Making Moderate    Rehab Potential Good    PT  Duration 6 weeks    PT Treatment/Interventions ADLs/Self Care Home Management;Cryotherapy;Electrical Stimulation;Moist Heat;Therapeutic activities;Therapeutic exercise;Patient/family education;Manual techniques;Passive range of motion;Dry needling;Neuromuscular re-education;Taping;Vasopneumatic Device    PT  Next Visit Plan Continued exercise progression, further manual for PROM/joint mobs/STM as needed, furtther dry needling prn    PT Home Exercise Plan Access code: [email or web address]    Consulted and Agree with Plan of Care Patient           Patient will benefit from skilled therapeutic intervention in order to improve the following deficits and impairments:  Pain,Impaired UE functional use,Decreased strength,Decreased range of motion,Hypomobility  Visit Diagnosis: Incomplete tear of left rotator cuff, unspecified whether traumatic  Chronic left shoulder pain  Stiffness of left shoulder, not elsewhere classified  Muscle weakness (generalized)     Problem List Patient Active Problem List   Diagnosis Date Noted  . Left carpal tunnel syndrome 12/27/2019  . Adhesive capsulitis of left shoulder 12/27/2019  . Tendinopathy of left biceps 12/27/2019  . Hyperlipidemia with target LDL less than 100 06/23/2012  . Family history of heart disease in male family member before age 48 06/23/2012  . Current smoker 06/23/2012    Carney Living PT DPT  06/13/2020, 4:42 PM  [facility] [address] [place], [place], [address] Phone: [phone]   Fax:  [phone]  Name: Oscar Myers MRN: [id number] Date of Birth: Jul 25, 1955

## 2020-07-04 ENCOUNTER — Ambulatory Visit: Payer: No Typology Code available for payment source | Attending: Physician Assistant | Admitting: Physical Therapy

## 2020-07-04 ENCOUNTER — Other Ambulatory Visit: Payer: Self-pay

## 2020-07-04 ENCOUNTER — Encounter: Payer: Self-pay | Admitting: Physical Therapy

## 2020-07-04 DIAGNOSIS — M6281 Muscle weakness (generalized): Secondary | ICD-10-CM | POA: Insufficient documentation

## 2020-07-04 DIAGNOSIS — G8929 Other chronic pain: Secondary | ICD-10-CM | POA: Diagnosis present

## 2020-07-04 DIAGNOSIS — M25512 Pain in left shoulder: Secondary | ICD-10-CM | POA: Diagnosis present

## 2020-07-04 DIAGNOSIS — M25612 Stiffness of left shoulder, not elsewhere classified: Secondary | ICD-10-CM | POA: Diagnosis present

## 2020-07-04 DIAGNOSIS — M75112 Incomplete rotator cuff tear or rupture of left shoulder, not specified as traumatic: Secondary | ICD-10-CM | POA: Insufficient documentation

## 2020-07-04 NOTE — Therapy (Signed)
[place], [place], [address] Phone: [phone]   Fax:  [phone]  Physical Therapy Treatment  Patient Details  Name: Oscar Myers MRN: [id number] Date of Birth: 1955-08-17 Referring Provider (PT): Dwana Melena, PA-C (surgery by Frankey Shown, MD)   Encounter Date: 07/04/2020   PT End of Session - 07/04/20 1053    Visit Number 24    Number of Visits 30    Date for PT Re-Evaluation 07/09/20    Authorization Type [place]    Authorization Time Period 11/03/19-05/01/20, 04/30/20-08/29/19    Authorization - Visit Number 7    Authorization - Number of Visits 15    PT Start Time 0930    PT Stop Time 1010    PT Time Calculation (min) 40 min           Past Medical History:  Diagnosis Date  . CTS (carpal tunnel syndrome)   . Diabetes mellitus without complication ([place])   . Dyslipidemia     Past Surgical History:  Procedure Laterality Date  . BACK SURGERY    . CARPAL TUNNEL RELEASE Left 04/30/2016   Procedure: LEFT CARPAL TUNNEL RELEASE ENDOSCOPIC;  Surgeon: Renette Butters, MD;  Location: [facility];  Service: Orthopedics;  Laterality: Left;  . CARPAL TUNNEL RELEASE Left 01/10/2020   Procedure: LEFT CARPAL TUNNEL RELEASE;  Surgeon: Leandrew Koyanagi, MD;  Location: [facility];  Service: Orthopedics;  Laterality: Left;  . COLONOSCOPY  2012  . SHOULDER ARTHROSCOPY WITH ROTATOR CUFF REPAIR AND SUBACROMIAL DECOMPRESSION Left 01/10/2020   Procedure: LEFT SHOULDER ARTHROSCOPY WITH EXTENSIVE DEBRIDEMENT, SUBACROMIAL DECOMPRESSION, DISTAL CLAVICLE EXCISION, MANIPULATION UNDER ANESTHESIA;  Surgeon: Leandrew Koyanagi, MD;  Location: [facility];  Service: Orthopedics;  Laterality: Left;    There were no vitals filed for this visit.   Subjective Assessment - 07/04/20 0935    Subjective Pt reports 3/10 shoulder pain.    Currently in Pain? Yes    Pain Score 3     Pain Location  Shoulder    Pain Orientation Left    Pain Descriptors / Indicators Aching    Pain Type Chronic pain    Aggravating Factors  reaching behind back, putting on a shirt    Pain Relieving Factors avoid painful motions              [facility] PT Assessment - 07/04/20 0001      AROM   Left Shoulder Flexion 112 Degrees    Left Shoulder ABduction 80 Degrees    Left Shoulder Internal Rotation --   reaches L5   Left Shoulder External Rotation --   reaches back of neck     PROM   Left Shoulder Flexion 115 Degrees    Left Shoulder ABduction 95 Degrees    Left Shoulder Internal Rotation 65 Degrees    Left Shoulder External Rotation 50 Degrees                         [facility] Adult PT Treatment/Exercise - 07/04/20 0001      Shoulder Exercises: Standing   External Rotation Limitations Freemtoin cable 3 lbs. 2x10    Internal Rotation Limitations 2x10 Freemotion cable with 7 lbs.    Flexion AROM;20 reps;Left    Shoulder Flexion Weight (lbs) 2    Flexion Limitations flexion to 90 deg 2x10    Other Standing Exercises scaption 2# 2 x 10 to 90  Other Standing Exercises small ball circles at wall 30 sec each way      Shoulder Exercises: ROM/Strengthening   UBE (Upper Arm Bike) L2 3 min each eay    Lat Pull Limitations 3x10 with 35 lbs. in standing   underhand grip   Cybex Row Limitations horizontal   grips with 35 lbs. 3x10 Vertical grips 3x10                    PT Short Term Goals - 05/28/20 1256      PT SHORT TERM GOAL #1   Title Independent with initial HEP    Baseline met    Time 4    Period Weeks    Status Achieved      PT SHORT TERM GOAL #2   Title Increase left shoulder AROM to at least 90 deg or greater to work towards improved reaching ability for ADLs such as grooming    Baseline flexion 116 deg, abduction <90    Time 4    Period Weeks    Status Partially Met             PT Long Term Goals - 05/28/20 1257      PT LONG TERM GOAL #1   Title  Improve FOTO outcome measure score to 37% or less impairment    Baseline 43% limited    Time 6    Period Weeks    Status On-going    Target Date 07/09/20      PT LONG TERM GOAL #2   Title Left shoulder AROM grossly WFL to dress and bathe with left UE use    Baseline ongoing-still limited by stiffness, see objective    Time 6    Period Weeks    Status On-going    Target Date 07/09/20      PT LONG TERM GOAL #3   Title Left shoulder strength grossly 5/5 to improve ability for lifting activities and to assist return to work pending MD clearance    Baseline see objective-ongoing    Time 6    Period Weeks    Status On-going    Target Date 07/09/20      PT LONG TERM GOAL #4   Title Perform reaching ADLs including dressing, bathing, chores as needed with left shoulder pain 3/10 or less    Baseline ongoing, pain>3/10    Time 6    Period Weeks    Status On-going    Target Date 07/09/20                 Plan - 07/04/20 1024    Clinical Impression Statement Pt reports he stretches 2 x per week. Continued with strength and ROM as tolerated. Increased pain with end range PROM all planes. He tolerated therex with intermittent increased pain.    PT Next Visit Plan Continued exercise progression, further manual for PROM/joint mobs/STM as needed, furtther dry needling prn           Patient will benefit from skilled therapeutic intervention in order to improve the following deficits and impairments:  Pain,Impaired UE functional use,Decreased strength,Decreased range of motion,Hypomobility  Visit Diagnosis: Incomplete tear of left rotator cuff, unspecified whether traumatic  Chronic left shoulder pain  Stiffness of left shoulder, not elsewhere classified  Muscle weakness (generalized)     Problem List Patient Active Problem List   Diagnosis Date Noted  . Left carpal tunnel syndrome 12/27/2019  . Adhesive capsulitis of left shoulder 12/27/2019  .  Tendinopathy of left  biceps 12/27/2019  . Hyperlipidemia with target LDL less than 100 06/23/2012  . Family history of heart disease in male family member before age 72 06/23/2012  . Current smoker 06/23/2012    Dorene Ar, PTA 07/04/2020, 10:55 AM  [facility] [address] [place], [place], [address] Phone: [phone]   Fax:  [phone]  Name: Oscar Myers MRN: [id number] Date of Birth: 08/17/1955

## 2020-07-08 ENCOUNTER — Telehealth: Payer: Self-pay | Admitting: Physical Therapy

## 2020-07-08 ENCOUNTER — Ambulatory Visit: Payer: No Typology Code available for payment source | Admitting: Physical Therapy

## 2020-07-08 NOTE — Telephone Encounter (Signed)
Contacted patient regarding missed visit. Patient reports he forgot. He will be at his next visit.

## 2020-07-10 ENCOUNTER — Other Ambulatory Visit: Payer: Self-pay

## 2020-07-10 ENCOUNTER — Ambulatory Visit: Payer: No Typology Code available for payment source | Admitting: Physical Therapy

## 2020-07-10 ENCOUNTER — Encounter: Payer: Self-pay | Admitting: Physical Therapy

## 2020-07-10 DIAGNOSIS — M25512 Pain in left shoulder: Secondary | ICD-10-CM

## 2020-07-10 DIAGNOSIS — M75112 Incomplete rotator cuff tear or rupture of left shoulder, not specified as traumatic: Secondary | ICD-10-CM

## 2020-07-10 DIAGNOSIS — G8929 Other chronic pain: Secondary | ICD-10-CM

## 2020-07-10 DIAGNOSIS — M25612 Stiffness of left shoulder, not elsewhere classified: Secondary | ICD-10-CM

## 2020-07-10 DIAGNOSIS — M6281 Muscle weakness (generalized): Secondary | ICD-10-CM

## 2020-07-10 NOTE — Therapy (Signed)
[place], [place], [address] Phone: [phone]   Fax:  [phone]  Physical Therapy Treatment/Recertification  Patient Details  Name: Oscar Myers MRN: [id number] Date of Birth: Jun 23, 1955 Referring Provider (PT): Dwana Melena, PA-C (surgery by Frankey Shown, MD)   Encounter Date: 07/10/2020   PT End of Session - 07/10/20 0936    Visit Number 25    Number of Visits 30    Date for PT Re-Evaluation 08/21/20    Authorization Type VA    Authorization Time Period 11/03/19-05/01/20, 04/30/20-08/29/19    Authorization - Visit Number 8    Authorization - Number of Visits 15    PT Start Time 0926    PT Stop Time 1013    PT Time Calculation (min) 47 min    Activity Tolerance Patient tolerated treatment well    Behavior During Therapy [facility] for tasks assessed/performed           Past Medical History:  Diagnosis Date  . CTS (carpal tunnel syndrome)   . Diabetes mellitus without complication ([place])   . Dyslipidemia     Past Surgical History:  Procedure Laterality Date  . BACK SURGERY    . CARPAL TUNNEL RELEASE Left 04/30/2016   Procedure: LEFT CARPAL TUNNEL RELEASE ENDOSCOPIC;  Surgeon: Renette Butters, MD;  Location: [facility];  Service: Orthopedics;  Laterality: Left;  . CARPAL TUNNEL RELEASE Left 01/10/2020   Procedure: LEFT CARPAL TUNNEL RELEASE;  Surgeon: Leandrew Koyanagi, MD;  Location: [facility];  Service: Orthopedics;  Laterality: Left;  . COLONOSCOPY  2012  . SHOULDER ARTHROSCOPY WITH ROTATOR CUFF REPAIR AND SUBACROMIAL DECOMPRESSION Left 01/10/2020   Procedure: LEFT SHOULDER ARTHROSCOPY WITH EXTENSIVE DEBRIDEMENT, SUBACROMIAL DECOMPRESSION, DISTAL CLAVICLE EXCISION, MANIPULATION UNDER ANESTHESIA;  Surgeon: Leandrew Koyanagi, MD;  Location: [facility];  Service: Orthopedics;  Laterality: Left;    There were no vitals filed for this visit.   Subjective  Assessment - 07/10/20 1007    Subjective 3/10 left shoulder pain pre-tx. which increases with activity. Pt. does note some improvement with less pain and improving tolerance day to day activities but still limited in functional use for left shoulder with reaching and lifting activities-see assessment/plan.    Limitations House hold activities;Lifting    Diagnostic tests X-rays    Patient Stated Goals Get shoulder pain, return to work    Currently in Pain? Yes    Pain Score 3     Pain Location Shoulder    Pain Orientation Left    Pain Descriptors / Indicators Dull;Sharp    Pain Type Chronic pain    Pain Onset More than a month ago    Pain Frequency Constant    Aggravating Factors  activity    Pain Relieving Factors rest    Effect of Pain on Daily Activities impacts ability reaching and lifting activites              [facility] PT Assessment - 07/10/20 0001      Assessment   Medical Diagnosis s/p left shoulder arthroscopic, SAD, DCE, debridement of rotator cuff and labrum, biceps tenotomy, manipulation under anesthesia, also had left carpal tunnel release along with left shoulder surgery    Referring Provider (PT) Dwana Melena, PA-C   surgery by Frankey Shown, MD   Onset Date/Surgical Date 01/10/20    Hand Dominance Right      Observation/Other Assessments   Focus on Therapeutic Outcomes (FOTO)  50%  function      AROM   Left Shoulder Flexion 98 Degrees    Left Shoulder ABduction 80 Degrees    Left Shoulder Internal Rotation --   reach to sacrum   Left Shoulder External Rotation --   reach to lateral neck     PROM   Left Shoulder Flexion 110 Degrees    Left Shoulder ABduction 100 Degrees    Left Shoulder Internal Rotation 65 Degrees    Left Shoulder External Rotation 43 Degrees      Strength   Left Shoulder Extension 4+/5    Left Shoulder ABduction 4/5    Left Shoulder Internal Rotation 5/5    Left Shoulder External Rotation 4/5      Palpation   Palpation comment  TTP/localized pain at right [facility] joint, trigger point right anterior deltoid                         [facility] Adult PT Treatment/Exercise - 07/10/20 0001      Shoulder Exercises: Supine   Flexion AROM;Strengthening;Left;20 reps    Shoulder Flexion Weight (lbs) 2    Flexion Limitations 2x10      Shoulder Exercises: Sidelying   ABduction Limitations left scaption 3x10 with 2 lbs.      Shoulder Exercises: Standing   External Rotation Limitations Freemtion cable 3 lbs. 2x10    Internal Rotation Limitations 2x10 Freemotion cable with 7 lbs.    Shoulder Flexion Weight (lbs) --    Flexion Limitations flexion to 90 deg 2x10   held weight due to muscle fatigue after other exercises     Shoulder Exercises: Pulleys   Flexion 2 minutes    Scaption 2 minutes      Shoulder Exercises: ROM/Strengthening   UBE (Upper Arm Bike) L2 x 5 min with 2.5 min ea. fw/rev    Lat Pull Limitations 3x10 with 35 lbs. in standing   underhand grip   Cybex Row Limitations horizontal   grips with 35 lbs. 3x10 Vertical grips 3x10      Manual Therapy   Joint Mobilization grade III-IV GH mobs caudal glides and AP performed at end-ranges PROM    Soft tissue mobilization left posterior shoulder and anterior deltoid    Passive ROM left shoulder 4-way                  PT Education - 07/10/20 1106    Education Details FOTO/current status, HEP-stretches, POC    Person(s) Educated Patient    Methods Explanation    Comprehension Verbalized understanding            PT Short Term Goals - 07/10/20 1111      PT SHORT TERM GOAL #1   Title Independent with initial HEP    Baseline met    Time 4    Period Weeks    Status Achieved      PT SHORT TERM GOAL #2   Title Increase left shoulder AROM to at least 90 deg or greater to work towards improved reaching ability for ADLs such as grooming    Baseline flexion 98 deg, abd 80 deg 07/10/20    Time 4    Period Weeks    Status Partially Met              PT Long Term Goals - 07/10/20 1112      PT LONG TERM GOAL #1   Title Improve FOTO outcome measure score to 37% or  less impairment    Baseline 50% 07/10/20    Time 6    Period Weeks    Status On-going    Target Date 08/21/20      PT LONG TERM GOAL #2   Title Left shoulder AROM grossly WFL to dress and bathe with left UE use    Baseline ongoing-still limited by stiffness, see objective    Time 6    Period Weeks    Status On-going    Target Date 08/21/20      PT LONG TERM GOAL #3   Title Left shoulder strength grossly 5/5 to improve ability for lifting activities    Baseline see objective-ongoing    Time 6    Period Weeks    Status On-going    Target Date 08/21/20      PT LONG TERM GOAL #4   Title Perform reaching ADLs including dressing, bathing, chores as needed with left shoulder pain 3/10 or less    Baseline 3/10 pre-tx. but increased with activity, goal ongoing    Time 6    Period Weeks    Status On-going    Target Date 08/21/20                 Plan - 07/10/20 1107    Clinical Impression Statement Pt. now 6 months s/p left shoulder surgery today showing some increased stiffness with ROM compared with status last progress note. Mild gains in left shoulder flexion strength and subjectively notes improving ability ADLs and less shoulder pain but functional status for reaching ADLs still limited with left shoulder stiffness and weakness. Plan continue PT for further progress to work on improving left shoulder ROM and strength for improved functional status.    Personal Factors and Comorbidities Other    Examination-Activity Limitations Lift;Hygiene/Grooming;Bathing;Dressing;Sleep;Bed Mobility;Carry;Reach Overhead    Examination-Participation Restrictions Driving;Meal Prep;Laundry;Cleaning;Occupation    Stability/Clinical Decision Making Stable/Uncomplicated    Clinical Decision Making Moderate    Rehab Potential Good    PT Frequency --   2-3x/week   PT  Duration 6 weeks    PT Treatment/Interventions ADLs/Self Care Home Management;Cryotherapy;Electrical Stimulation;Moist Heat;Therapeutic activities;Therapeutic exercise;Patient/family education;Manual techniques;Passive range of motion;Dry needling;Neuromuscular re-education;Taping;Vasopneumatic Device    PT Next Visit Plan Continued exercise progression, stretches, further manual for PROM/joint mobs/STM as needed, further dry needling prn    PT Home Exercise Plan Access code: [email or web address]    Consulted and Agree with Plan of Care Patient           Patient will benefit from skilled therapeutic intervention in order to improve the following deficits and impairments:  Pain,Impaired UE functional use,Decreased strength,Decreased range of motion,Hypomobility  Visit Diagnosis: Incomplete tear of left rotator cuff, unspecified whether traumatic  Chronic left shoulder pain  Stiffness of left shoulder, not elsewhere classified  Muscle weakness (generalized)     Problem List Patient Active Problem List   Diagnosis Date Noted  . Left carpal tunnel syndrome 12/27/2019  . Adhesive capsulitis of left shoulder 12/27/2019  . Tendinopathy of left biceps 12/27/2019  . Hyperlipidemia with target LDL less than 100 06/23/2012  . Family history of heart disease in male family member before age 24 06/23/2012  . Current smoker 06/23/2012   Beaulah Dinning, PT, DPT 07/10/20 11:14 AM  [facility] [address] [place], [place], [address] Phone: [phone]   Fax:  [phone]  Name: Oscar Myers MRN: [id number] Date of Birth: Jul 23, 1955

## 2020-07-15 ENCOUNTER — Ambulatory Visit: Payer: No Typology Code available for payment source | Admitting: Physical Therapy

## 2020-07-15 ENCOUNTER — Encounter: Payer: Self-pay | Admitting: Physical Therapy

## 2020-07-15 ENCOUNTER — Other Ambulatory Visit: Payer: Self-pay

## 2020-07-15 DIAGNOSIS — M25512 Pain in left shoulder: Secondary | ICD-10-CM

## 2020-07-15 DIAGNOSIS — G8929 Other chronic pain: Secondary | ICD-10-CM

## 2020-07-15 DIAGNOSIS — M75112 Incomplete rotator cuff tear or rupture of left shoulder, not specified as traumatic: Secondary | ICD-10-CM | POA: Diagnosis not present

## 2020-07-15 DIAGNOSIS — M25612 Stiffness of left shoulder, not elsewhere classified: Secondary | ICD-10-CM

## 2020-07-15 DIAGNOSIS — M6281 Muscle weakness (generalized): Secondary | ICD-10-CM

## 2020-07-15 NOTE — Therapy (Signed)
[place], [place], [address] Phone: [phone]   Fax:  [phone]  Physical Therapy Treatment  Patient Details  Name: Oscar Myers MRN: [id number] Date of Birth: 02-Jan-1956 Referring Provider (PT): Dwana Melena, PA-C (surgery by Frankey Shown, MD)   Encounter Date: 07/15/2020   PT End of Session - 07/15/20 0936    Visit Number 26    Number of Visits 30    Date for PT Re-Evaluation 08/21/20    Authorization Type VA    Authorization Time Period 11/03/19-05/01/20, 04/30/20-08/29/19    Authorization - Visit Number 8    Authorization - Number of Visits 15    PT Start Time 0930    PT Stop Time 1009    PT Time Calculation (min) 39 min    Activity Tolerance Patient tolerated treatment well    Behavior During Therapy [facility] for tasks assessed/performed           Past Medical History:  Diagnosis Date  . CTS (carpal tunnel syndrome)   . Diabetes mellitus without complication ([place])   . Dyslipidemia     Past Surgical History:  Procedure Laterality Date  . BACK SURGERY    . CARPAL TUNNEL RELEASE Left 04/30/2016   Procedure: LEFT CARPAL TUNNEL RELEASE ENDOSCOPIC;  Surgeon: Renette Butters, MD;  Location: [facility];  Service: Orthopedics;  Laterality: Left;  . CARPAL TUNNEL RELEASE Left 01/10/2020   Procedure: LEFT CARPAL TUNNEL RELEASE;  Surgeon: Leandrew Koyanagi, MD;  Location: [place];  Service: Orthopedics;  Laterality: Left;  . COLONOSCOPY  2012  . SHOULDER ARTHROSCOPY WITH ROTATOR CUFF REPAIR AND SUBACROMIAL DECOMPRESSION Left 01/10/2020   Procedure: LEFT SHOULDER ARTHROSCOPY WITH EXTENSIVE DEBRIDEMENT, SUBACROMIAL DECOMPRESSION, DISTAL CLAVICLE EXCISION, MANIPULATION UNDER ANESTHESIA;  Surgeon: Leandrew Koyanagi, MD;  Location: [place];  Service: Orthopedics;  Laterality: Left;    There were no vitals filed for this visit.   Subjective Assessment - 07/15/20  0935    Subjective Patient reports the shoulder has felt proetty good. He continues to have some pain but it is not to bad this morning. the pain goes up as the day goes on.    Pertinent History remote history of prevoius left shoulder surgery in the late 1970s, pt. also has right shoulder pain issues as well    Limitations House hold activities;Lifting    Diagnostic tests X-rays    Patient Stated Goals Get shoulder pain, return to work    Currently in Pain? Yes    Pain Score 3     Pain Location Shoulder    Pain Orientation Left    Pain Descriptors / Indicators Aching    Pain Type Chronic pain    Pain Onset More than a month ago    Pain Frequency Constant    Aggravating Factors  activity    Pain Relieving Factors rest    Effect of Pain on Daily Activities ability to reach up    Multiple Pain Sites No                             [facility] Adult PT Treatment/Exercise - 07/15/20 0001      Shoulder Exercises: Supine   Flexion AROM;Strengthening;Left;20 reps    Shoulder Flexion Weight (lbs) 2    Flexion Limitations 2x10      Shoulder Exercises: Sidelying   External Rotation Limitations x20 2lb  Shoulder Exercises: Standing   External Rotation Limitations Freemtion cable 3 lbs. 2x10    Internal Rotation Limitations 2x10 Freemotion cable with 7 lbs.    Flexion Limitations flexion to 90 deg 2x10   held weight due to muscle fatigue after other exercises     Shoulder Exercises: ROM/Strengthening   UBE (Upper Arm Bike) L2 x 4 min with 2. min ea. fw/rev    Lat Pull Limitations 3x10 with 35 lbs. in standing   underhand grip   Cybex Row Limitations horizontal   grips with 35 lbs. 3x10 Vertical grips 3x10      Shoulder Exercises: Stretch   Cross Chest Stretch 3 reps;20 seconds      Manual Therapy   Joint Mobilization grade III-IV GH mobs caudal glides and AP performed at end-ranges PROM    Soft tissue mobilization left posterior shoulder and anterior deltoid     Passive ROM left shoulder 4-way                  PT Education - 07/15/20 1022    Education Details HEP and symptom mangement    Person(s) Educated Patient    Methods Explanation;Demonstration;Tactile cues;Verbal cues    Comprehension Verbalized understanding;Verbal cues required;Returned demonstration;Tactile cues required            PT Short Term Goals - 07/10/20 1111      PT SHORT TERM GOAL #1   Title Independent with initial HEP    Baseline met    Time 4    Period Weeks    Status Achieved      PT SHORT TERM GOAL #2   Title Increase left shoulder AROM to at least 90 deg or greater to work towards improved reaching ability for ADLs such as grooming    Baseline flexion 98 deg, abd 80 deg 07/10/20    Time 4    Period Weeks    Status Partially Met             PT Long Term Goals - 07/10/20 1112      PT LONG TERM GOAL #1   Title Improve FOTO outcome measure score to 37% or less impairment    Baseline 50% 07/10/20    Time 6    Period Weeks    Status On-going    Target Date 08/21/20      PT LONG TERM GOAL #2   Title Left shoulder AROM grossly WFL to dress and bathe with left UE use    Baseline ongoing-still limited by stiffness, see objective    Time 6    Period Weeks    Status On-going    Target Date 08/21/20      PT LONG TERM GOAL #3   Title Left shoulder strength grossly 5/5 to improve ability for lifting activities    Baseline see objective-ongoing    Time 6    Period Weeks    Status On-going    Target Date 08/21/20      PT LONG TERM GOAL #4   Title Perform reaching ADLs including dressing, bathing, chores as needed with left shoulder pain 3/10 or less    Baseline 3/10 pre-tx. but increased with activity, goal ongoing    Time 6    Period Weeks    Status On-going    Target Date 08/21/20                 Plan - 07/15/20 1042    Clinical Impression Statement Patient had an  increase in pain to 5-6/10 with exercises. His flexion and er  remains limited. He was able to complete gym exercises. therapy will continue to progress as tolerated.    Personal Factors and Comorbidities Other    Examination-Activity Limitations Lift;Hygiene/Grooming;Bathing;Dressing;Sleep;Bed Mobility;Carry;Reach Overhead    Examination-Participation Restrictions Driving;Meal Prep;Laundry;Cleaning;Occupation    Stability/Clinical Decision Making Stable/Uncomplicated    Clinical Decision Making Moderate    Rehab Potential Good    PT Duration 6 weeks    PT Treatment/Interventions ADLs/Self Care Home Management;Cryotherapy;Electrical Stimulation;Moist Heat;Therapeutic activities;Therapeutic exercise;Patient/family education;Manual techniques;Passive range of motion;Dry needling;Neuromuscular re-education;Taping;Vasopneumatic Device    PT Next Visit Plan Continued exercise progression, stretches, further manual for PROM/joint mobs/STM as needed, further dry needling prn    PT Home Exercise Plan Access code: [email or web address]    Consulted and Agree with Plan of Care Patient           Patient will benefit from skilled therapeutic intervention in order to improve the following deficits and impairments:  Pain,Impaired UE functional use,Decreased strength,Decreased range of motion,Hypomobility  Visit Diagnosis: Incomplete tear of left rotator cuff, unspecified whether traumatic  Chronic left shoulder pain  Stiffness of left shoulder, not elsewhere classified  Muscle weakness (generalized)     Problem List Patient Active Problem List   Diagnosis Date Noted  . Left carpal tunnel syndrome 12/27/2019  . Adhesive capsulitis of left shoulder 12/27/2019  . Tendinopathy of left biceps 12/27/2019  . Hyperlipidemia with target LDL less than 100 06/23/2012  . Family history of heart disease in male family member before age 88 06/23/2012  . Current smoker 06/23/2012    Carney Living PT DPT  07/15/2020, 12:50 PM  [facility] [address] [place], [place], [address] Phone: [phone]   Fax:  [phone]  Name: Oscar Myers MRN: [id number] Date of Birth: 1956-04-16

## 2020-07-17 ENCOUNTER — Ambulatory Visit: Payer: No Typology Code available for payment source | Admitting: Physical Therapy

## 2020-07-17 ENCOUNTER — Encounter: Payer: Self-pay | Admitting: Physical Therapy

## 2020-07-17 ENCOUNTER — Other Ambulatory Visit: Payer: Self-pay

## 2020-07-17 DIAGNOSIS — M25612 Stiffness of left shoulder, not elsewhere classified: Secondary | ICD-10-CM

## 2020-07-17 DIAGNOSIS — G8929 Other chronic pain: Secondary | ICD-10-CM

## 2020-07-17 DIAGNOSIS — M75112 Incomplete rotator cuff tear or rupture of left shoulder, not specified as traumatic: Secondary | ICD-10-CM | POA: Diagnosis not present

## 2020-07-17 DIAGNOSIS — M6281 Muscle weakness (generalized): Secondary | ICD-10-CM

## 2020-07-17 NOTE — Therapy (Signed)
[place], [place], [address] Phone: [phone]   Fax:  [phone]  Physical Therapy Treatment  Patient Details  Name: Oscar Myers MRN: [id number] Date of Birth: [birth year] Referring Provider (PT): Dwana Melena, PA-C (surgery by Frankey Shown, MD)   Encounter Date: 07/17/2020   PT End of Session - 07/17/20 0938    Visit Number 27    Number of Visits 30    Date for PT Re-Evaluation 08/21/20    Authorization Type [facility]    Authorization Time Period 11/03/19-05/01/20, 04/30/20-08/29/19    Authorization - Visit Number 8    Authorization - Number of Visits 15    PT Start Time 0932    PT Stop Time 1015    PT Time Calculation (min) 43 min    Activity Tolerance Patient tolerated treatment well    Behavior During Therapy [facility] for tasks assessed/performed           Past Medical History:  Diagnosis Date  . CTS (carpal tunnel syndrome)   . Diabetes mellitus without complication ([place])   . Dyslipidemia     Past Surgical History:  Procedure Laterality Date  . BACK SURGERY    . CARPAL TUNNEL RELEASE Left 04/30/2016   Procedure: LEFT CARPAL TUNNEL RELEASE ENDOSCOPIC;  Surgeon: Renette Butters, MD;  Location: [place];  Service: Orthopedics;  Laterality: Left;  . CARPAL TUNNEL RELEASE Left 01/10/2020   Procedure: LEFT CARPAL TUNNEL RELEASE;  Surgeon: Leandrew Koyanagi, MD;  Location: [place];  Service: Orthopedics;  Laterality: Left;  . COLONOSCOPY  2012  . SHOULDER ARTHROSCOPY WITH ROTATOR CUFF REPAIR AND SUBACROMIAL DECOMPRESSION Left 01/10/2020   Procedure: LEFT SHOULDER ARTHROSCOPY WITH EXTENSIVE DEBRIDEMENT, SUBACROMIAL DECOMPRESSION, DISTAL CLAVICLE EXCISION, MANIPULATION UNDER ANESTHESIA;  Surgeon: Leandrew Koyanagi, MD;  Location: [place];  Service: Orthopedics;  Laterality: Left;    There were no vitals filed for this visit.   Subjective Assessment - 07/17/20  0936    Subjective Patient reports his pain is doing OK today. he reports the pain was pretty bad yesterday. He was sore after the last visit. He can not think of anything he did yesteray.    Pertinent History remote history of prevoius left shoulder surgery in the late 1970s, pt. also has right shoulder pain issues as well    Limitations House hold activities;Lifting    Diagnostic tests X-rays    Patient Stated Goals Get shoulder pain, return to work    Currently in Pain? Yes    Pain Score 4     Pain Location Shoulder    Pain Orientation Left    Pain Descriptors / Indicators Aching    Pain Type Chronic pain    Pain Onset More than a month ago    Pain Frequency Constant    Aggravating Factors  use of the shoulder    Pain Relieving Factors rest    Effect of Pain on Daily Activities ability to reach up    Multiple Pain Sites No                             [facility] Adult PT Treatment/Exercise - 07/17/20 0001      Shoulder Exercises: Supine   Shoulder Flexion Weight (lbs) 2    Flexion Limitations 2x10      Shoulder Exercises: Sidelying   External Rotation Limitations x20 2lb  Shoulder Exercises: Standing   Other Standing Exercises scaption 2# 2 x 10 to 90    Other Standing Exercises chops 2x15 3 plates      Shoulder Exercises: ROM/Strengthening   Nustep L5 x 5 min UE/LE    Lat Pull Limitations 3x10 with 35 lbs. in standing   underhand grip   Cybex Row Limitations horizontal   grips with 35 lbs. 3x10 Vertical grips 3x10                  PT Education - 07/17/20 0937    Education Details reviewed HEP and symptom mangement    Person(s) Educated Patient    Methods Explanation;Tactile cues;Verbal cues;Demonstration    Comprehension Tactile cues required;Verbal cues required;Returned demonstration;Verbalized understanding            PT Short Term Goals - 07/10/20 1111      PT SHORT TERM GOAL #1   Title Independent with initial HEP    Baseline met     Time 4    Period Weeks    Status Achieved      PT SHORT TERM GOAL #2   Title Increase left shoulder AROM to at least 90 deg or greater to work towards improved reaching ability for ADLs such as grooming    Baseline flexion 98 deg, abd 80 deg 07/10/20    Time 4    Period Weeks    Status Partially Met             PT Long Term Goals - 07/10/20 1112      PT LONG TERM GOAL #1   Title Improve FOTO outcome measure score to 37% or less impairment    Baseline 50% 07/10/20    Time 6    Period Weeks    Status On-going    Target Date 08/21/20      PT LONG TERM GOAL #2   Title Left shoulder AROM grossly WFL to dress and bathe with left UE use    Baseline ongoing-still limited by stiffness, see objective    Time 6    Period Weeks    Status On-going    Target Date 08/21/20      PT LONG TERM GOAL #3   Title Left shoulder strength grossly 5/5 to improve ability for lifting activities    Baseline see objective-ongoing    Time 6    Period Weeks    Status On-going    Target Date 08/21/20      PT LONG TERM GOAL #4   Title Perform reaching ADLs including dressing, bathing, chores as needed with left shoulder pain 3/10 or less    Baseline 3/10 pre-tx. but increased with activity, goal ongoing    Time 6    Period Weeks    Status On-going    Target Date 08/21/20                 Plan - 07/17/20 0940    Clinical Impression Statement Therapy worked on agressive ROM today. his ROm was better today then las tvisit nbut still limited. We worked on [employer] release.    Examination-Activity Limitations Lift;Hygiene/Grooming;Bathing;Dressing;Sleep;Bed Mobility;Carry;Reach Overhead    Examination-Participation Restrictions Driving;Meal Prep;Laundry;Cleaning;Occupation    Stability/Clinical Decision Making Stable/Uncomplicated    Clinical Decision Making Moderate    Rehab Potential Good    PT Duration 6 weeks    PT Treatment/Interventions ADLs/Self Care Home  Management;Cryotherapy;Electrical Stimulation;Moist Heat;Therapeutic activities;Therapeutic exercise;Patient/family education;Manual techniques;Passive range of motion;Dry needling;Neuromuscular re-education;Taping;Vasopneumatic Device  PT Next Visit Plan Continued exercise progression, stretches, further manual for PROM/joint mobs/STM as needed, further dry needling prn    PT Home Exercise Plan Access code: [email or web address]    Consulted and Agree with Plan of Care Patient           Patient will benefit from skilled therapeutic intervention in order to improve the following deficits and impairments:  Pain,Impaired UE functional use,Decreased strength,Decreased range of motion,Hypomobility  Visit Diagnosis: Incomplete tear of left rotator cuff, unspecified whether traumatic  Chronic left shoulder pain  Stiffness of left shoulder, not elsewhere classified  Muscle weakness (generalized)     Problem List Patient Active Problem List   Diagnosis Date Noted  . Left carpal tunnel syndrome 12/27/2019  . Adhesive capsulitis of left shoulder 12/27/2019  . Tendinopathy of left biceps 12/27/2019  . Hyperlipidemia with target LDL less than 100 06/23/2012  . Family history of heart disease in male family member before age 30 06/23/2012  . Current smoker 06/23/2012    Carney Living PT DPT  07/17/2020, 12:33 PM  [facility] [address] [place], [place], [address] Phone: [phone]   Fax:  [phone]  Name: MIHCAEL LEDEE MRN: [id number] Date of Birth: 1955-12-28

## 2020-07-22 ENCOUNTER — Ambulatory Visit: Payer: No Typology Code available for payment source | Admitting: Physical Therapy

## 2020-07-22 ENCOUNTER — Encounter: Payer: Self-pay | Admitting: Physical Therapy

## 2020-07-22 ENCOUNTER — Other Ambulatory Visit: Payer: Self-pay

## 2020-07-22 DIAGNOSIS — M25512 Pain in left shoulder: Secondary | ICD-10-CM

## 2020-07-22 DIAGNOSIS — M6281 Muscle weakness (generalized): Secondary | ICD-10-CM

## 2020-07-22 DIAGNOSIS — M25612 Stiffness of left shoulder, not elsewhere classified: Secondary | ICD-10-CM

## 2020-07-22 DIAGNOSIS — G8929 Other chronic pain: Secondary | ICD-10-CM

## 2020-07-22 DIAGNOSIS — M75112 Incomplete rotator cuff tear or rupture of left shoulder, not specified as traumatic: Secondary | ICD-10-CM | POA: Diagnosis not present

## 2020-07-22 NOTE — Therapy (Signed)
[place], [place], [address] Phone: [phone]   Fax:  [phone]  Physical Therapy Treatment  Patient Details  Name: Oscar Myers MRN: [id number] Date of Birth: 1955-06-05 Referring Provider (PT): Dwana Melena, PA-C (surgery by Frankey Shown, MD)   Encounter Date: 07/22/2020   PT End of Session - 07/22/20 0934    Visit Number 28    Number of Visits 30    Date for PT Re-Evaluation 08/21/20    Authorization Type VA    Authorization Time Period 11/03/19-05/01/20, 04/30/20-08/29/19    Authorization - Visit Number 9    Authorization - Number of Visits 15    PT Start Time 0932    PT Stop Time 1015    PT Time Calculation (min) 43 min           Past Medical History:  Diagnosis Date  . CTS (carpal tunnel syndrome)   . Diabetes mellitus without complication ([place])   . Dyslipidemia     Past Surgical History:  Procedure Laterality Date  . BACK SURGERY    . CARPAL TUNNEL RELEASE Left 04/30/2016   Procedure: LEFT CARPAL TUNNEL RELEASE ENDOSCOPIC;  Surgeon: Renette Butters, MD;  Location: [place];  Service: Orthopedics;  Laterality: Left;  . CARPAL TUNNEL RELEASE Left 01/10/2020   Procedure: LEFT CARPAL TUNNEL RELEASE;  Surgeon: Leandrew Koyanagi, MD;  Location: [place];  Service: Orthopedics;  Laterality: Left;  . COLONOSCOPY  2012  . SHOULDER ARTHROSCOPY WITH ROTATOR CUFF REPAIR AND SUBACROMIAL DECOMPRESSION Left 01/10/2020   Procedure: LEFT SHOULDER ARTHROSCOPY WITH EXTENSIVE DEBRIDEMENT, SUBACROMIAL DECOMPRESSION, DISTAL CLAVICLE EXCISION, MANIPULATION UNDER ANESTHESIA;  Surgeon: Leandrew Koyanagi, MD;  Location: [place];  Service: Orthopedics;  Laterality: Left;    There were no vitals filed for this visit.   Subjective Assessment - 07/22/20 0933    Subjective Pt reports no pain and he was sore for a couple of days after last session. Shoulde is about the  same.    Currently in Pain? No/denies              [facility] PT Assessment - 07/22/20 0001      PROM   Left Shoulder Flexion 110 Degrees   after mobs and shoulder PROM   Left Shoulder ABduction 105 Degrees    Left Shoulder Internal Rotation 65 Degrees    Left Shoulder External Rotation 45 Degrees                         [facility] Adult PT Treatment/Exercise - 07/22/20 0001      Shoulder Exercises: Supine   Protraction Limitations serratus punch 10 x 2 no wt , c/o pulling posterior shoulder   completed after shouder PROM, did not have supine ROM to 90 prior to PROM stretching   Shoulder Flexion Weight (lbs) 2    Flexion Limitations 3x10   up to about 75 degrees, pt reports cannot get to 90     Shoulder Exercises: Sidelying   External Rotation Limitations --    ABduction Limitations --      Shoulder Exercises: Standing   External Rotation Limitations Freemtion cable 3 lbs. 3x10    Internal Rotation Limitations 3x10 Freemotion cable with 7 lbs.    Other Standing Exercises scaption 2# 3 x 10 to 90    Other Standing Exercises chops 3 x 10 3 plates   free motion  Shoulder Exercises: Pulleys   Flexion 2 minutes   approx 100   Scaption 2 minutes   approx 90 degrees     Shoulder Exercises: ROM/Strengthening   UBE (Upper Arm Bike) L3 3 minutes each way    Lat Pull Limitations 3x10 with 35 lbs. in standing   underhand grip   Cybex Row Limitations horizontal   grips with 35 lbs. 3x10 Vertical grips 3x10      Manual Therapy   Joint Mobilization A/p and inferior glides to increase ROM    Passive ROM left shoulder 4-way                    PT Short Term Goals - 07/10/20 1111      PT SHORT TERM GOAL #1   Title Independent with initial HEP    Baseline met    Time 4    Period Weeks    Status Achieved      PT SHORT TERM GOAL #2   Title Increase left shoulder AROM to at least 90 deg or greater to work towards improved reaching ability for ADLs such as  grooming    Baseline flexion 98 deg, abd 80 deg 07/10/20    Time 4    Period Weeks    Status Partially Met             PT Long Term Goals - 07/10/20 1112      PT LONG TERM GOAL #1   Title Improve FOTO outcome measure score to 37% or less impairment    Baseline 50% 07/10/20    Time 6    Period Weeks    Status On-going    Target Date 08/21/20      PT LONG TERM GOAL #2   Title Left shoulder AROM grossly WFL to dress and bathe with left UE use    Baseline ongoing-still limited by stiffness, see objective    Time 6    Period Weeks    Status On-going    Target Date 08/21/20      PT LONG TERM GOAL #3   Title Left shoulder strength grossly 5/5 to improve ability for lifting activities    Baseline see objective-ongoing    Time 6    Period Weeks    Status On-going    Target Date 08/21/20      PT LONG TERM GOAL #4   Title Perform reaching ADLs including dressing, bathing, chores as needed with left shoulder pain 3/10 or less    Baseline 3/10 pre-tx. but increased with activity, goal ongoing    Time 6    Period Weeks    Status On-going    Target Date 08/21/20                 Plan - 07/22/20 1108    Clinical Impression Statement Continued previous treatment approach with gym machines for strengthening in available ROM as well as AAROM and PROM. Pt tolerated session well but with increased pain at end range PROM, especially flexion and abduction. PROM flexion improved from 80 to 110 during session.    PT Next Visit Plan Continued exercise progression, stretches, further manual for PROM/joint mobs/STM as needed, further dry needling prn    PT Home Exercise Plan Access code: [email or web address]           Patient will benefit from skilled therapeutic intervention in order to improve the following deficits and impairments:  Pain,Impaired UE functional use,Decreased strength,Decreased range of motion,Hypomobility  Visit Diagnosis: Incomplete tear of left rotator cuff, unspecified  whether traumatic  Chronic left shoulder pain  Stiffness of left shoulder, not elsewhere classified  Muscle weakness (generalized)     Problem List Patient Active Problem List   Diagnosis Date Noted  . Left carpal tunnel syndrome 12/27/2019  . Adhesive capsulitis of left shoulder 12/27/2019  . Tendinopathy of left biceps 12/27/2019  . Hyperlipidemia with target LDL less than 100 06/23/2012  . Family history of heart disease in male family member before age 47 06/23/2012  . Current smoker 06/23/2012    Dorene Ar, PTA 07/22/2020, 11:10 AM  [facility] [address] [place], [place], [address] Phone: [phone]   Fax:  [phone]  Name: Oscar Myers MRN: [id number] Date of Birth: July 03, 1955

## 2020-07-25 ENCOUNTER — Ambulatory Visit: Payer: No Typology Code available for payment source | Admitting: Physical Therapy

## 2020-07-25 ENCOUNTER — Encounter: Payer: Self-pay | Admitting: Physical Therapy

## 2020-07-25 ENCOUNTER — Other Ambulatory Visit: Payer: Self-pay

## 2020-07-25 DIAGNOSIS — M75112 Incomplete rotator cuff tear or rupture of left shoulder, not specified as traumatic: Secondary | ICD-10-CM | POA: Diagnosis not present

## 2020-07-25 DIAGNOSIS — G8929 Other chronic pain: Secondary | ICD-10-CM

## 2020-07-25 DIAGNOSIS — M25512 Pain in left shoulder: Secondary | ICD-10-CM

## 2020-07-25 DIAGNOSIS — M25612 Stiffness of left shoulder, not elsewhere classified: Secondary | ICD-10-CM

## 2020-07-25 DIAGNOSIS — M6281 Muscle weakness (generalized): Secondary | ICD-10-CM

## 2020-07-25 NOTE — Therapy (Signed)
[place], [place], [address] Phone: [phone]   Fax:  [phone]  Physical Therapy Treatment  Patient Details  Name: Oscar Myers MRN: [id number] Date of Birth: [birth year] Referring Provider (PT): Dwana Melena, PA-C (surgery by Frankey Shown, MD)   Encounter Date: 07/25/2020   PT End of Session - 07/25/20 0937    Visit Number 29    Number of Visits 30    Date for PT Re-Evaluation 08/21/20    Authorization Type VA    Authorization Time Period 11/03/19-05/01/20, 04/30/20-08/29/19    Authorization - Visit Number 10    Authorization - Number of Visits 15    PT Start Time 0933    PT Stop Time 1011    PT Time Calculation (min) 38 min           Past Medical History:  Diagnosis Date  . CTS (carpal tunnel syndrome)   . Diabetes mellitus without complication ([place])   . Dyslipidemia     Past Surgical History:  Procedure Laterality Date  . BACK SURGERY    . CARPAL TUNNEL RELEASE Left 04/30/2016   Procedure: LEFT CARPAL TUNNEL RELEASE ENDOSCOPIC;  Surgeon: Renette Butters, MD;  Location: [place];  Service: Orthopedics;  Laterality: Left;  . CARPAL TUNNEL RELEASE Left 01/10/2020   Procedure: LEFT CARPAL TUNNEL RELEASE;  Surgeon: Leandrew Koyanagi, MD;  Location: [place];  Service: Orthopedics;  Laterality: Left;  . COLONOSCOPY  2012  . SHOULDER ARTHROSCOPY WITH ROTATOR CUFF REPAIR AND SUBACROMIAL DECOMPRESSION Left 01/10/2020   Procedure: LEFT SHOULDER ARTHROSCOPY WITH EXTENSIVE DEBRIDEMENT, SUBACROMIAL DECOMPRESSION, DISTAL CLAVICLE EXCISION, MANIPULATION UNDER ANESTHESIA;  Surgeon: Leandrew Koyanagi, MD;  Location: [place];  Service: Orthopedics;  Laterality: Left;    There were no vitals filed for this visit.   Subjective Assessment - 07/25/20 0949    Subjective Pain is about a 4-5/10 , the usual    Currently in Pain? Yes    Pain Score 5     Pain Location  Shoulder    Pain Orientation Left    Pain Descriptors / Indicators Aching    Pain Type Chronic pain    Aggravating Factors  use of shoulder    Pain Relieving Factors rest              [facility] PT Assessment - 07/25/20 0001      PROM   Left Shoulder Flexion 115 Degrees   after mobs and shoulder PROM   Left Shoulder ABduction 105 Degrees    Left Shoulder Internal Rotation 70 Degrees    Left Shoulder External Rotation 50 Degrees                         [facility] Adult PT Treatment/Exercise - 07/25/20 0001      Shoulder Exercises: Supine   Protraction Limitations 10 x 3 3#   no difficulty performing from 90 degrees shoulder flexion today   Shoulder Flexion Weight (lbs) 2    Flexion Limitations 3x10   up to about 75 degrees   Other Supine Exercises 3# on dowl chest press x 15 then pullovers x 15   approx 110 degrees ROM shoulder flexion     Shoulder Exercises: Sidelying   External Rotation Limitations x30 2lb    ABduction Limitations left scaption 3x10 with 2 lbs.      Shoulder Exercises: Standing   External Rotation Limitations blue  band 10 x 3    Internal Rotation Limitations black band 10 x 3    Other Standing Exercises scaption 2# 3 x 10 to 90    Other Standing Exercises chops black band 10 x 3      Shoulder Exercises: ROM/Strengthening   UBE (Upper Arm Bike) L3 3 minutes each way    Lat Pull Limitations 3x10 with 35 lbs. in standing   underhand grip   Cybex Row Limitations horizontal   grips with 35 lbs. 3x10 Vertical grips 3x10      Manual Therapy   Joint Mobilization A/p and inferior glides to increase ROM    Passive ROM left shoulder 4-way                    PT Short Term Goals - 07/10/20 1111      PT SHORT TERM GOAL #1   Title Independent with initial HEP    Baseline met    Time 4    Period Weeks    Status Achieved      PT SHORT TERM GOAL #2   Title Increase left shoulder AROM to at least 90 deg or greater to work towards improved  reaching ability for ADLs such as grooming    Baseline flexion 98 deg, abd 80 deg 07/10/20    Time 4    Period Weeks    Status Partially Met             PT Long Term Goals - 07/10/20 1112      PT LONG TERM GOAL #1   Title Improve FOTO outcome measure score to 37% or less impairment    Baseline 50% 07/10/20    Time 6    Period Weeks    Status On-going    Target Date 08/21/20      PT LONG TERM GOAL #2   Title Left shoulder AROM grossly WFL to dress and bathe with left UE use    Baseline ongoing-still limited by stiffness, see objective    Time 6    Period Weeks    Status On-going    Target Date 08/21/20      PT LONG TERM GOAL #3   Title Left shoulder strength grossly 5/5 to improve ability for lifting activities    Baseline see objective-ongoing    Time 6    Period Weeks    Status On-going    Target Date 08/21/20      PT LONG TERM GOAL #4   Title Perform reaching ADLs including dressing, bathing, chores as needed with left shoulder pain 3/10 or less    Baseline 3/10 pre-tx. but increased with activity, goal ongoing    Time 6    Period Weeks    Status On-going    Target Date 08/21/20                 Plan - 07/25/20 0950    Clinical Impression Statement Continued previous treatment approach with strengthening in available ROM as well as AAROM and PROM. Pt tolerated session well but with increased pain at end range PROM, especially flexion and abduction. ROM at start of manual was improved compared to last session.    PT Next Visit Plan Continued exercise progression, stretches, further manual for PROM/joint mobs/STM as needed, further dry needling prn    PT Home Exercise Plan Access code: [email or web address]           Patient will benefit from skilled therapeutic intervention in  order to improve the following deficits and impairments:  Pain,Impaired UE functional use,Decreased strength,Decreased range of motion,Hypomobility  Visit Diagnosis: Incomplete tear of left  rotator cuff, unspecified whether traumatic  Chronic left shoulder pain  Stiffness of left shoulder, not elsewhere classified  Muscle weakness (generalized)     Problem List Patient Active Problem List   Diagnosis Date Noted  . Left carpal tunnel syndrome 12/27/2019  . Adhesive capsulitis of left shoulder 12/27/2019  . Tendinopathy of left biceps 12/27/2019  . Hyperlipidemia with target LDL less than 100 06/23/2012  . Family history of heart disease in male family member before age 22 06/23/2012  . Current smoker 06/23/2012    Dorene Ar, [place] 07/25/2020, 10:15 AM  [facility] [address] [place], [place], [address] Phone: [phone]   Fax:  [phone]  Name: Oscar Myers MRN: [id number] Date of Birth: 04-01-1956

## 2020-07-29 ENCOUNTER — Ambulatory Visit: Payer: No Typology Code available for payment source | Admitting: Physical Therapy

## 2020-07-29 ENCOUNTER — Encounter: Payer: Self-pay | Admitting: Physical Therapy

## 2020-07-29 ENCOUNTER — Other Ambulatory Visit: Payer: Self-pay

## 2020-07-29 DIAGNOSIS — M25512 Pain in left shoulder: Secondary | ICD-10-CM

## 2020-07-29 DIAGNOSIS — M75112 Incomplete rotator cuff tear or rupture of left shoulder, not specified as traumatic: Secondary | ICD-10-CM | POA: Diagnosis not present

## 2020-07-29 DIAGNOSIS — M6281 Muscle weakness (generalized): Secondary | ICD-10-CM

## 2020-07-29 DIAGNOSIS — M25612 Stiffness of left shoulder, not elsewhere classified: Secondary | ICD-10-CM

## 2020-07-29 DIAGNOSIS — G8929 Other chronic pain: Secondary | ICD-10-CM

## 2020-07-29 NOTE — Therapy (Signed)
[place], [place], [address] Phone: [phone]   Fax:  [phone]  Physical Therapy Treatment  Patient Details  Name: Oscar Myers MRN: [id number] Date of Birth: [birth year] Referring Provider (PT): Dwana Melena, PA-C (surgery by Frankey Shown, MD)   Encounter Date: 07/29/2020   PT End of Session - 07/29/20 0935    Visit Number 30    Number of Visits 30    Date for PT Re-Evaluation 08/21/20    Authorization Type [facility]    Authorization Time Period 11/03/19-05/01/20, 04/30/20-08/29/19    Authorization - Visit Number 11    Authorization - Number of Visits 15    PT Start Time 0930    PT Stop Time 0953    PT Time Calculation (min) 23 min           Past Medical History:  Diagnosis Date  . CTS (carpal tunnel syndrome)   . Diabetes mellitus without complication ([place])   . Dyslipidemia     Past Surgical History:  Procedure Laterality Date  . BACK SURGERY    . CARPAL TUNNEL RELEASE Left 04/30/2016   Procedure: LEFT CARPAL TUNNEL RELEASE ENDOSCOPIC;  Surgeon: Renette Butters, MD;  Location: [facility];  Service: Orthopedics;  Laterality: Left;  . CARPAL TUNNEL RELEASE Left 01/10/2020   Procedure: LEFT CARPAL TUNNEL RELEASE;  Surgeon: Leandrew Koyanagi, MD;  Location: [facility];  Service: Orthopedics;  Laterality: Left;  . COLONOSCOPY  2012  . SHOULDER ARTHROSCOPY WITH ROTATOR CUFF REPAIR AND SUBACROMIAL DECOMPRESSION Left 01/10/2020   Procedure: LEFT SHOULDER ARTHROSCOPY WITH EXTENSIVE DEBRIDEMENT, SUBACROMIAL DECOMPRESSION, DISTAL CLAVICLE EXCISION, MANIPULATION UNDER ANESTHESIA;  Surgeon: Leandrew Koyanagi, MD;  Location: [facility];  Service: Orthopedics;  Laterality: Left;    There were no vitals filed for this visit.   Subjective Assessment - 07/29/20 0934    Subjective Pain is a 5-6/10. Shoulder hurt all weekend. I am not sure why.    Currently in Pain? Yes     Pain Score 6     Pain Location Shoulder    Pain Orientation Left    Pain Descriptors / Indicators Aching    Pain Type Chronic pain    Aggravating Factors  use of shoulder    Pain Relieving Factors rest                             [facility] Adult PT Treatment/Exercise - 07/29/20 0001      Shoulder Exercises: Supine   Other Supine Exercises supine cane chest press -  pt performed lower than chest height      Shoulder Exercises: Standing   External Rotation Limitations Freemtion cable 3 lbs. 3x10    Internal Rotation Limitations 3x10 Freemotion cable with 7 lbs.    Other Standing Exercises declined      Shoulder Exercises: Pulleys   Flexion 2 minutes    Scaption 2 minutes      Shoulder Exercises: ROM/Strengthening   UBE (Upper Arm Bike) L2 3 minutes each way    Lat Pull Limitations declined due to increased pain wtih elevation of left shoulder    Cybex Row Limitations horizontal   grips with 35 lbs. 3x10 Vertical grips 3x10      Modalities   Modalities --   decined     Manual Therapy   Joint Mobilization declined    Passive ROM declined  PT Short Term Goals - 07/10/20 1111      PT SHORT TERM GOAL #1   Title Independent with initial HEP    Baseline met    Time 4    Period Weeks    Status Achieved      PT SHORT TERM GOAL #2   Title Increase left shoulder AROM to at least 90 deg or greater to work towards improved reaching ability for ADLs such as grooming    Baseline flexion 98 deg, abd 80 deg 07/10/20    Time 4    Period Weeks    Status Partially Met             PT Long Term Goals - 07/10/20 1112      PT LONG TERM GOAL #1   Title Improve FOTO outcome measure score to 37% or less impairment    Baseline 50% 07/10/20    Time 6    Period Weeks    Status On-going    Target Date 08/21/20      PT LONG TERM GOAL #2   Title Left shoulder AROM grossly WFL to dress and bathe with left UE use    Baseline ongoing-still  limited by stiffness, see objective    Time 6    Period Weeks    Status On-going    Target Date 08/21/20      PT LONG TERM GOAL #3   Title Left shoulder strength grossly 5/5 to improve ability for lifting activities    Baseline see objective-ongoing    Time 6    Period Weeks    Status On-going    Target Date 08/21/20      PT LONG TERM GOAL #4   Title Perform reaching ADLs including dressing, bathing, chores as needed with left shoulder pain 3/10 or less    Baseline 3/10 pre-tx. but increased with activity, goal ongoing    Time 6    Period Weeks    Status On-going    Target Date 08/21/20                 Plan - 07/29/20 0948    Clinical Impression Statement Pt arrives reporting increased pain of unknown cause. He states sometimes it just hurts more. Shorter session today due to patient declining to perform any exercise that elevated his arm in supine or standing. He also declined PROM, manual or modalities. He stated that he almost did not come today.    PT Next Visit Plan Is he better than last session?    PT Home Exercise Plan Access code: [email or web address]           Patient will benefit from skilled therapeutic intervention in order to improve the following deficits and impairments:  Pain,Impaired UE functional use,Decreased strength,Decreased range of motion,Hypomobility  Visit Diagnosis: Incomplete tear of left rotator cuff, unspecified whether traumatic  Chronic left shoulder pain  Stiffness of left shoulder, not elsewhere classified  Muscle weakness (generalized)     Problem List Patient Active Problem List   Diagnosis Date Noted  . Left carpal tunnel syndrome 12/27/2019  . Adhesive capsulitis of left shoulder 12/27/2019  . Tendinopathy of left biceps 12/27/2019  . Hyperlipidemia with target LDL less than 100 06/23/2012  . Family history of heart disease in male family member before age 33 06/23/2012  . Current smoker 06/23/2012    Dorene Ar,  PTA 07/29/2020, 10:05 AM  [place], [place],  [address] Phone: [phone]   Fax:  [phone]  Name: LIEUTENANT ABARCA MRN: [id number] Date of Birth: 64-Dec-1957

## 2020-07-31 ENCOUNTER — Other Ambulatory Visit: Payer: Self-pay

## 2020-07-31 ENCOUNTER — Ambulatory Visit: Payer: No Typology Code available for payment source | Admitting: Physical Therapy

## 2020-07-31 ENCOUNTER — Encounter: Payer: Self-pay | Admitting: Physical Therapy

## 2020-07-31 DIAGNOSIS — G8929 Other chronic pain: Secondary | ICD-10-CM

## 2020-07-31 DIAGNOSIS — M6281 Muscle weakness (generalized): Secondary | ICD-10-CM

## 2020-07-31 DIAGNOSIS — M75112 Incomplete rotator cuff tear or rupture of left shoulder, not specified as traumatic: Secondary | ICD-10-CM | POA: Diagnosis not present

## 2020-07-31 DIAGNOSIS — M25612 Stiffness of left shoulder, not elsewhere classified: Secondary | ICD-10-CM

## 2020-07-31 NOTE — Therapy (Addendum)
[place], [place], [address] Phone: [phone]   Fax:  [phone]  Physical Therapy Treatment/[facility]ch[facility]re  Patient Details  Name: Oscar Myers MRN: [id number] Date of Birth: 1955-12-09 Referring Provider (PT): Dwana Melena, PA-C (surgery by Frankey Shown, MD)   Encounter Date: 07/31/2020   PT End of Session - 07/31/20 1449     Visit Number 31    Number of Visits 31    Date for PT Re-Evaluation 08/21/20    Authorization Type [facility]    Authorization Time Period 11/03/19-05/01/20, 04/30/20-08/29/19    Authorization - Visit Number 15    PT Start Time 0930    PT Stop Time 1005   Patient's shoulder was sore and he has all the activity he needs   PT Time Calculation (min) 35 min    Activity Tolerance Patient tolerated treatment well    Behavior During Therapy [facility] for tasks assessed/performed             Past Medical History:  Diagnosis Date   CTS (carpal tunnel syndrome)    Diabetes mellitus without complication ([place])    Dyslipidemia     Past Surgical History:  Procedure Laterality Date   BACK SURGERY     CARPAL TUNNEL RELEASE Left 04/30/2016   Procedure: LEFT CARPAL TUNNEL RELEASE ENDOSCOPIC;  Surgeon: Renette Butters, MD;  Location: [facility];  Service: Orthopedics;  Laterality: Left;   CARPAL TUNNEL RELEASE Left 01/10/2020   Procedure: LEFT CARPAL TUNNEL RELEASE;  Surgeon: Leandrew Koyanagi, MD;  Location: [facility];  Service: Orthopedics;  Laterality: Left;   COLONOSCOPY  2012   SHOULDER ARTHROSCOPY WITH ROTATOR CUFF REPAIR AND SUBACROMIAL DECOMPRESSION Left 01/10/2020   Procedure: LEFT SHOULDER ARTHROSCOPY WITH EXTENSIVE DEBRIDEMENT, SUBACROMIAL DECOMPRESSION, DISTAL CLAVICLE EXCISION, MANIPULATION UNDER ANESTHESIA;  Surgeon: Leandrew Koyanagi, MD;  Location: [facility];  Service: Orthopedics;  Laterality: Left;    There were no vitals filed for this visit.    Subjective Assessment - 07/31/20 0932     Subjective Patients pain is about the same. He continues to have problems lifting his arm up.    Pertinent History remote history of prevoius left shoulder surgery in the late 1970s, pt. also has right shoulder pain issues as well    Limitations House hold activities;Lifting    Patient Stated Goals Get shoulder pain, return to work    Currently in Pain? Yes    Pain Score 6     Pain Location Shoulder    Pain Orientation Right    Pain Descriptors / Indicators Aching    Pain Type Chronic pain    Pain Onset More than a month ago    Pain Frequency Constant    Aggravating Factors  use of the shoulder    Pain Relieving Factors rest    Effect of Pain on Daily Activities ability    Multiple Pain Sites No                [facility] PT Assessment - 07/31/20 0001       PROM   Left Shoulder Flexion 94 Degrees   tighter today then previous visits   Left Shoulder ABduction 100 Degrees    Left Shoulder External Rotation 50 Degrees      Strength   Right Shoulder Flexion 4+/5    Right Shoulder ABduction 4+/5    Right Shoulder Internal Rotation 5/5    Right Shoulder External Rotation 5/5  Left Shoulder Flexion 4/5    Left Shoulder Extension 4+/5    Left Shoulder ABduction 4/5    Left Shoulder Internal Rotation 5/5    Left Shoulder External Rotation 4/5    Right Elbow Flexion 5/5    Right Elbow Extension 5/5      Palpation   Palpation comment tenderness to palpation in the pec/anterior and posterior shoulder                           [facility] Adult PT Treatment/Exercise - 07/31/20 0001       Self-Care   Other Self-Care Comments  reviewed how to use particular stretches on different days and how to progress exercises.      Shoulder Exercises: Supine   Other Supine Exercises reviewed wand er stretch 10x 5 sec hold; reviewed wand flexion x10 in full range and x5 in limited range for when he is sore.      Shoulder Exercises:  Seated   Other Seated Exercises reviewed table slide stretch for home      Shoulder Exercises: Standing   Other Standing Exercises reviewed techniuqe qith band exercises. Revied IR and er 2x10 red with cuing for technique. Patient advised not to pull across his chest with IR. Patient given blue  band for home,    Other Standing Exercises standing shoulder flexion 2x10      Shoulder Exercises: ROM/Strengthening   UBE (Upper Arm Bike) L2 3 minutes each way                      PT Short Term Goals - 07/31/20 1314       PT SHORT TERM GOAL #1   Title Independent with initial HEP    Baseline met    Time 4    Period Weeks    Status Achieved      PT SHORT TERM GOAL #2   Title Increase left shoulder AROM to at least 90 deg or greater to work towards improved reaching ability for ADLs such as grooming    Baseline flexion 94 deg, abd 80 deg 3/30 arm gaurded today 2nd to pain    Time 4    Period Weeks    Status Deferred               PT Long Term Goals - 07/31/20 1315       PT LONG TERM GOAL #1   Title Improve FOTO outcome measure score to 37% or less impairment    Baseline 50% 07/10/20    Time 6    Period Weeks    Status On-going      PT LONG TERM GOAL #2   Title Left shoulder AROM grossly WFL to dress and bathe with left UE use    Baseline still has significant limitations in motion    Time 6    Period Weeks    Status Not Met      PT LONG TERM GOAL #3   Title Left shoulder strength grossly 5/5 to improve ability for lifting activities    Baseline see objective-ongoing    Time 6    Period Weeks    Status Not Met      PT LONG TERM GOAL #4   Title Perform reaching ADLs including dressing, bathing, chores as needed with left shoulder pain 3/10 or less    Baseline 3/10 pre-tx. but increased with activity,    Time 6  Status Not Met                   Plan - 07/31/20 1310     Clinical Impression Statement Patient has reached max potential for  therapy. He continues to hvae significant limitations in mortion and fucntion. He has the most trouble reaching overhead. he has a full exercise program. Virgel Bouquet reviewed different stretches with him today to use at home. We also reviewed technique with RTC exercises. He did not perfrom full HEP 2nd to increased soreness over the past 5 days. He was advised to rest and ice but continuew with his stretching over the next few days.    Personal Factors and Comorbidities Other    Examination-Activity Limitations Lift;Hygiene/Grooming;Bathing;Dressing;Sleep;Bed Mobility;Carry;Reach Overhead    Examination-Participation Restrictions Driving;Meal Prep;Laundry;Cleaning;Occupation    Stability/Clinical Decision Making Stable/Uncomplicated    Clinical Decision Making Moderate    Rehab Potential Good    PT Duration 6 weeks    PT Treatment/Interventions ADLs/Self Care Home Management;Cryotherapy;Electrical Stimulation;Moist Heat;Therapeutic activities;Therapeutic exercise;Patient/family education;Manual techniques;Passive range of motion;Dry needling;Neuromuscular re-education;Taping;Vasopneumatic Device    PT Next Visit Plan Is he better than last session?    PT Home Exercise Plan Access code: [email or web address]    Consulted and Agree with Plan of Care Patient             Patient will benefit from skilled therapeutic intervention in order to improve the following deficits and impairments:  Pain,Impaired UE functional use,Decreased strength,Decreased range of motion,Hypomobility  Visit Diagnosis: Incomplete tear of left rotator cuff, unspecified whether traumatic  Chronic left shoulder pain  Stiffness of left shoulder, not elsewhere classified  Muscle weakness (generalized)    PHYSICAL THERAPY DISCHARGE SUMMARY  Visits from Start of Care: 31  Current functional level related to goals / functional outcomes: Improved ability to lift arm   Remaining deficits: Significant limitations in shoulder  motion   Education / Equipment: HEP   Plan: Patient agrees to discharge.  Patient goals were not met. Patient is being discharged due to meeting the stated rehab goals.        Problem List Patient Active Problem List   Diagnosis Date Noted   Left carpal tunnel syndrome 12/27/2019   Adhesive capsulitis of left shoulder 12/27/2019   Tendinopathy of left biceps 12/27/2019   Hyperlipidemia with target LDL less than 100 06/23/2012   Family history of heart disease in male family member before age 66 06/23/2012   Current smoker 06/23/2012    Carney Living PT DPT  07/31/2020, 2:51 PM  [facility] [address] [place], [place], [address] Phone: [phone]   Fax:  [phone]  Name: Oscar Myers MRN: [id number] Date of Birth: [birth year]

## 2020-10-24 ENCOUNTER — Encounter: Payer: Self-pay | Admitting: Neurology

## 2020-10-24 ENCOUNTER — Ambulatory Visit (INDEPENDENT_AMBULATORY_CARE_PROVIDER_SITE_OTHER): Payer: No Typology Code available for payment source | Admitting: Neurology

## 2020-10-24 VITALS — BP 140/72 | HR 74 | Ht 70.0 in | Wt 187.0 lb

## 2020-10-24 DIAGNOSIS — R0683 Snoring: Secondary | ICD-10-CM

## 2020-10-24 DIAGNOSIS — G8929 Other chronic pain: Secondary | ICD-10-CM

## 2020-10-24 DIAGNOSIS — Z789 Other specified health status: Secondary | ICD-10-CM

## 2020-10-24 DIAGNOSIS — G4486 Cervicogenic headache: Secondary | ICD-10-CM

## 2020-10-24 DIAGNOSIS — G4719 Other hypersomnia: Secondary | ICD-10-CM

## 2020-10-24 DIAGNOSIS — R0681 Apnea, not elsewhere classified: Secondary | ICD-10-CM

## 2020-10-24 DIAGNOSIS — R519 Headache, unspecified: Secondary | ICD-10-CM | POA: Diagnosis not present

## 2020-10-24 DIAGNOSIS — M5412 Radiculopathy, cervical region: Secondary | ICD-10-CM | POA: Diagnosis not present

## 2020-10-24 NOTE — Progress Notes (Signed)
Subjective:    Patient ID: Oscar Myers is a 65 y.o. male.  HPI    Huston Foley, MD, PhD [facility] [address] [address] [place], [place] [address]  Dear Dr. Reita May,  I saw your patient, Oscar Myers, upon your kind request in my Neurologic clinic today for initial consultation of his headache.  The patient is unaccompanied today.  He has had no prior history of Banks is a 65 year old right-handed gentleman with an underlying medical history of reflux disease, diabetes, smoking, hyperlipidemia, cervical radiculopathy, carpal tunnel syndrome, status post left shoulder arthroscopic surgery in September 2021, and overweight state, who reports recurrent headaches for the past 3 to 4 years.  He reports that his headache often starts in the neck.  He has chronic neck pain and has seen neurosurgery for this, prior neurosurgical records are not available for my review for today.  He also reports that some 3 years ago he had a neck MRI and was told he had disc herniation on both sides.  He has radiating neck pain, more so on the left, it radiates to the shoulder.  He had shoulder arthroscopic surgery in September 2021.  He has had physical therapy.  He has tried naproxen for his headache but it does not help, current headache frequency is about once or twice per week but it can last for a few days.  He has woken up with a headache.  Of note, he snores and has been witnessed to have pauses in his breathing per wife's report.  He has never had a sleep study.  He denies night to night nocturia.  He does drink quite a bit of caffeine in the form of [facility], about 412 ounce cans per day on average.  He drinks no alcohol currently.  He smokes about 5 cigarettes/day and is working on smoking cessation.  He has no family history of migraine headaches or sleep apnea.  Growing up he did not have any migraines.  Other than the naproxen, he has tried over-the-counter  ibuprofen.  He does not take it daily.  He has also tried Excedrin Migraine.  He has received epidural steroid injections from what I can see in his chart.  He was told to see an orthopedic doctor when he saw a neurosurgeon through the [place] in [place].  He has not had a brain MRI.  I reviewed your office note from 07/24/2020.  He has been treated with naproxen and he has tried over-the-counter Excedrin migraine.  He had a blood work through your office on 04/08/2020 and I reviewed the results: ALT was 27, AST 12, total bilirubin 0.9, total cholesterol 177, creatinine 0.97, glucose 92, A1c 7.1, platelets mildly low at 136, triglycerides elevated at 314.   Headaches are throbbing at times, radiating from the right side in the back of his head forward.  Sometimes behind the eyes.  He has some light sensitivity, typically no nausea or vomiting.  Eye exam is up-to-date, he has the same eyeglasses, eye exam was about 2 months ago.  He denies any sudden onset of one-sided weakness or numbness or tingling or droopy face or slurring of speech.  His Past Medical History Is Significant For: Past Medical History:  Diagnosis Date   CTS (carpal tunnel syndrome)    Diabetes mellitus without complication (HCC)    Dyslipidemia     His Past Surgical History Is Significant For: Past Surgical History:  Procedure Laterality Date  BACK SURGERY     CARPAL TUNNEL RELEASE Left 04/30/2016   Procedure: LEFT CARPAL TUNNEL RELEASE ENDOSCOPIC;  Surgeon: Sheral Apley, MD;  Location: [facility];  Service: Orthopedics;  Laterality: Left;   CARPAL TUNNEL RELEASE Left 01/10/2020   Procedure: LEFT CARPAL TUNNEL RELEASE;  Surgeon: Tarry Kos, MD;  Location: [facility];  Service: Orthopedics;  Laterality: Left;   COLONOSCOPY  2012   SHOULDER ARTHROSCOPY WITH ROTATOR CUFF REPAIR AND SUBACROMIAL DECOMPRESSION Left 01/10/2020   Procedure: LEFT SHOULDER ARTHROSCOPY WITH EXTENSIVE DEBRIDEMENT,  SUBACROMIAL DECOMPRESSION, DISTAL CLAVICLE EXCISION, MANIPULATION UNDER ANESTHESIA;  Surgeon: Tarry Kos, MD;  Location: [facility];  Service: Orthopedics;  Laterality: Left;    His Family History Is Significant For: History reviewed. No pertinent family history.  His Social History Is Significant For: Social History   Socioeconomic History   Marital status: Married    Spouse name: Not on file   Number of children: Not on file   Years of education: Not on file   Highest education level: Not on file  Occupational History   Not on file  Tobacco Use   Smoking status: Former    Packs/day: 0.50    Years: 25.00    Pack years: 12.50    Types: Cigarettes   Smokeless tobacco: Never   Tobacco comments:    as of 5 9 2012  Vaping Use   Vaping Use: Never used  Substance and Sexual Activity   Alcohol use: Yes    Comment: socially- 5 per week   Drug use: No   Sexual activity: Yes  Other Topics Concern   Not on file  Social History Narrative   Not on file   Social Determinants of Health   Financial Resource Strain: Not on file  Food Insecurity: Not on file  Transportation Needs: Not on file  Physical Activity: Not on file  Stress: Not on file  Social Connections: Not on file    His Allergies Are:  No Known Allergies:   His Current Medications Are:  Outpatient Encounter Medications as of 10/24/2020  Medication Sig   aspirin EC 81 MG tablet Take 81 mg by mouth daily. Swallow whole.   atorvastatin (LIPITOR) 20 MG tablet Take 20 mg by mouth daily.    metFORMIN (GLUCOPHAGE) 500 MG tablet Take by mouth 2 (two) times daily with a meal.   omeprazole (PRILOSEC) 40 MG capsule Take 40 mg by mouth daily.    No facility-administered encounter medications on file as of 10/24/2020.  :   Review of Systems:  Out of a complete 14 point review of systems, all are reviewed and negative with the exception of these symptoms as listed below:   Review of Systems   Neurological:        Here for consult worsening h/a. Reports dealing with h/a over the last 3-4 years. Sts 3-4 h/a per month and can last between 3-7 days. Reports he has tried OTC meds but no benefit has been noted. Associated nausea, light/noise sensitivity, and dizziness has been noted.     Objective:  Neurological Exam  Physical Exam Physical Examination:   Vitals:   10/24/20 0806  BP: 140/72  Pulse: 74    General Examination: The patient is a very pleasant 65 y.o. male in no acute distress. He appears well-developed and well-nourished and well groomed.   HEENT: Normocephalic, atraumatic, pupils are equal, round and reactive to light, extraocular tracking is well-preserved, mild bilateral  cataracts noted, funduscopic exam is somewhat difficult but no obvious abnormalities are seen.  He has corrective eyeglasses.  Hearing is grossly intact with tuning fork.  Face is symmetric with normal facial animation and normal facial sensation to light touch, temperature and vibration.  Airway examination reveals a small airway, tonsils on the small side.  Tongue protrudes centrally and palate elevates symmetrically.  No carotid bruits.  Neck with good range of motion, no obvious crepitations.   Chest: Clear to auscultation without wheezing, rhonchi or crackles noted.  Heart: S1+S2+0, regular and normal without murmurs, rubs or gallops noted.   Abdomen: Soft, non-tender and non-distended with normal bowel sounds appreciated on auscultation.  Extremities: There is no pitting edema in the distal lower extremities bilaterally. Pedal pulses are intact.  Skin: Warm and dry without trophic changes noted. There are no varicose veins.  Musculoskeletal: exam reveals no obvious joint deformities, tenderness or joint swelling or erythema.   Neurologically:  Mental status: The patient is awake, alert and oriented in all 4 spheres. His immediate and remote memory, attention, language skills and fund of  knowledge are appropriate. There is no evidence of aphasia, agnosia, apraxia or anomia. Speech is clear with normal prosody and enunciation. Thought process is linear. Mood is normal and affect is normal.  Cranial nerves II - XII are as described above under HEENT exam. In addition: shoulder shrug is normal with equal shoulder height noted. Motor exam: Normal bulk, strength and tone is noted. There is no drift, tremor or rebound. Romberg is negative. Reflexes are 2+ throughout. Babinski: Toes are flexor bilaterally. Fine motor skills and coordination: intact with normal finger taps, normal hand movements, normal rapid alternating patting, normal foot taps and normal foot agility.  Cerebellar testing: No dysmetria or intention tremor on finger to nose testing. Heel to shin is unremarkable bilaterally. There is no truncal or gait ataxia.  Sensory exam: intact to light touch, vibration, temperature sense in the upper and lower extremities.  Gait, station and balance: He stands without difficulty, posture is age-appropriate, he walks without difficulty, preserved arm swing noted.  No problems turning, balance preserved.  Tandem walk is challenging for him however.   Assessment and Plan:  In summary, Oscar Myers is a very pleasant 65 y.o.-year old male with an underlying medical history of reflux disease, diabetes, smoking, hyperlipidemia, cervical radiculopathy, carpal tunnel syndrome, status post left shoulder arthroscopic surgery in September 2021, and overweight state, who presents for evaluation of his recurrent headaches of approximately 3 to 4 years duration.  He has tried over-the-counter medication including ibuprofen and Excedrin and as far as prescription medication, he is only tried naproxen over the past years.  He has a longstanding history of chronic neck pain with radiation to both arms but mostly to the left shoulder.  He has undergone left shoulder arthroscopic surgery within the past year.   He had epidural steroid injections into the neck, and has seen a spine specialist in the past but not recently.  He is encouraged to talk to about a referral to see a spine surgeon again.  History is not telltale for migraine headaches, I believe his headaches are multifactorial in nature, caffeine overuse may play a role, underlying sleep disturbance as a possibility with underlying sleep disordered breathing not excluded.  Cervicogenic headaches are likely a contributor as well.  Neurological exam is nonfocal which is reassuring.  He has never had a brain MRI.  I would like to investigate things further  from my end of things and proceed with a brain MRI with and without contrast as well as a sleep study to rule out underlying obstructive sleep apnea.  We we will keep him posted as to his test results by phone call and reconvene after testing.  If he has obstructive sleep apnea, I will likely recommend a CPAP or AutoPap machine.  I talked to him about this as well today.  He is advised to follow-up with you and discuss the possibility of a referral to a spine specialist.  He has seen a neurosurgeon in the past.  He had a cervical spine MRI but I did not have those records available for my review today.  I advised him to use over-the-counter anti-inflammatory medication very cautiously.  I did not suggest any new medications from my end of things quite yet as we will proceed with more diagnostic evaluation at this time.  If treatment of his neck pain is initiated, he may notice a benefit for his headaches as well.  He agrees with this.  He is advised that we will call him soon to schedule his brain MRI as well as sleep study.  He is encouraged to follow-up with your clinic in the interim.  He is advised to reduce his caffeine intake and stay well-hydrated with water. I answered all his questions today and he was in agreement with the plan. Thank you very much for allowing me to participate in the care of this  nice patient. If I can be of any further assistance to you please do not hesitate to call me at [phone].  Sincerely,   Huston FoleySaima Lawson Isabell, MD, PhD

## 2020-10-24 NOTE — Patient Instructions (Signed)
It was nice to meet you today.  I believe your headache is likely from a combination of factors, often headaches are as a result of having chronic neck pain.  He has a history of degenerative neck disease.  I would recommend that you get evaluated with a spine specialist again.  You had received epidural steroid injections from what I can see, you may be a candidate for repeat injections.  Please talk to your primary care about a referral to either a neurosurgeon or orthopedic spine surgeon.    From our end, we will do further investigation of your headaches.  We will do a brain scan, called MRI and call you with the test results. We will have to schedule you for this on a separate date. This test requires authorization from your insurance, and we will take care of the insurance process.  Based on your symptoms and your exam I believe you are at risk for obstructive sleep apnea (aka OSA). We should proceed with a sleep study to determine whether you do or do not have OSA and how severe it is. Even, if you have mild OSA, I may want you to consider treatment with a so-called CPAP or AutoPap machine.  Treatment of even borderline or mild sleep apnea can result and improvement of symptoms such as sleep disruption, daytime sleepiness, nighttime bathroom breaks, restless leg symptoms, improvement of headache syndromes, even improved mood disorder.  Please remember, the long-term risks and ramifications of untreated moderate to severe obstructive sleep apnea are: increased Cardiovascular disease, including congestive heart failure, stroke, difficult to control hypertension, treatment resistant obesity, arrhythmias, especially irregular heartbeat commonly known as A. Fib. (atrial fibrillation); even type 2 diabetes has been linked to untreated OSA.   We will plan a follow-up after testing.   In preparation of your brain MRI, we will check your kidney function with 1 blood test today.

## 2020-10-25 LAB — COMPREHENSIVE METABOLIC PANEL
ALT: 16 IU/L (ref 0–44)
AST: 18 IU/L (ref 0–40)
Albumin/Globulin Ratio: 2.4 — ABNORMAL HIGH (ref 1.2–2.2)
Albumin: 5 g/dL — ABNORMAL HIGH (ref 3.8–4.8)
Alkaline Phosphatase: 92 IU/L (ref 44–121)
BUN/Creatinine Ratio: 9 — ABNORMAL LOW (ref 10–24)
BUN: 7 mg/dL — ABNORMAL LOW (ref 8–27)
Bilirubin Total: 0.6 mg/dL (ref 0.0–1.2)
CO2: 25 mmol/L (ref 20–29)
Calcium: 11.3 mg/dL — ABNORMAL HIGH (ref 8.6–10.2)
Chloride: 100 mmol/L (ref 96–106)
Creatinine, Ser: 0.81 mg/dL (ref 0.76–1.27)
Globulin, Total: 2.1 g/dL (ref 1.5–4.5)
Glucose: 92 mg/dL (ref 65–99)
Potassium: 4.3 mmol/L (ref 3.5–5.2)
Sodium: 139 mmol/L (ref 134–144)
Total Protein: 7.1 g/dL (ref 6.0–8.5)
eGFR: 98 mL/min/{1.73_m2} (ref >59)

## 2020-10-28 ENCOUNTER — Telehealth: Payer: Self-pay

## 2020-10-28 NOTE — Telephone Encounter (Signed)
I called the pt, pt was not available at the time of the call. Will CB at a later time.

## 2020-10-28 NOTE — Telephone Encounter (Signed)
-----   Message from Huston Foley, MD sent at 10/28/2020  6:51 AM EDT ----- Labs were benign, okay to proceed with brain MRI with and without contrast as planned, please update patient.

## 2020-10-28 NOTE — Telephone Encounter (Signed)
I called pt and reviewed lab results. Pt verbalized understanding and had no questions/concerns at the time of the call.

## 2020-11-19 ENCOUNTER — Telehealth: Payer: Self-pay | Admitting: Neurology

## 2020-11-19 NOTE — Telephone Encounter (Signed)
Patient's MRI order was sent to GI. I received notification from GI today that patient wants to go somewhere in network with the [place]. I called the [facility] [phone]) and was advised to fax the MRI order to [phone].

## 2021-08-14 LAB — LAB REPORT - SCANNED
Albumin, Urine POC: 0.2
Albumin/Creatinine Ratio, Urine, POC: 30
EGFR: 96
HM Hepatitis Screen: NEGATIVE
PSA, Total: 2.13

## 2021-09-02 LAB — LAB REPORT - SCANNED: Creatinine, POC: 2.35 mg/dL

## 2022-01-07 ENCOUNTER — Other Ambulatory Visit: Payer: Self-pay | Admitting: Family Medicine

## 2022-01-07 ENCOUNTER — Ambulatory Visit
Admission: RE | Admit: 2022-01-07 | Discharge: 2022-01-07 | Disposition: A | Discharge status: Home / Self Care | Payer: No Typology Code available for payment source | Source: Ambulatory Visit | Attending: Family Medicine | Admitting: Family Medicine

## 2022-01-07 DIAGNOSIS — M542 Cervicalgia: Secondary | ICD-10-CM

## 2022-01-07 DIAGNOSIS — R200 Anesthesia of skin: Secondary | ICD-10-CM

## 2022-01-07 DIAGNOSIS — M25561 Pain in right knee: Secondary | ICD-10-CM

## 2022-01-07 LAB — LAB REPORT - SCANNED: EGFR: 89

## 2022-08-31 ENCOUNTER — Ambulatory Visit (INDEPENDENT_AMBULATORY_CARE_PROVIDER_SITE_OTHER): Payer: Medicare Other | Admitting: Medical

## 2022-08-31 VITALS — BP 130/80 | HR 93 | Ht 69.5 in | Wt 187.8 lb

## 2022-08-31 DIAGNOSIS — E785 Hyperlipidemia, unspecified: Secondary | ICD-10-CM | POA: Diagnosis not present

## 2022-08-31 DIAGNOSIS — E1169 Type 2 diabetes mellitus with other specified complication: Secondary | ICD-10-CM | POA: Diagnosis not present

## 2022-08-31 DIAGNOSIS — Z7185 Encounter for immunization safety counseling: Secondary | ICD-10-CM

## 2022-08-31 NOTE — Progress Notes (Signed)
Subjective:  Oscar Myers is a 67 y.o. male who presents for Chief Complaint  Patient presents with   new get established    [facility] get [facility]. Having some shoulder pain from shoulder injury.      Here as a new patient.  [facility].  I see his wife Oscar Myers as well.  Wants to have local provider for acute needs as well.   Sees PCP at [facility] for diabetes and cholesterol.   Compliant with medicaiton Metformin 500mg  BID, Crestor 40mg , 1/2 tablet daily (20mg  daily) and Aspirin 81mg  daily.  Checks glucose some, not daily.  Glucose is good.  Last HgbA1C 6.2% back in Februay 2024.  He had pneumonia recently but that resolved.    No other aggravating or relieving factors.    No other c/o.  Past Medical History:  Diagnosis Date   CTS (carpal tunnel syndrome)    Diabetes mellitus without complication (HCC)    Dyslipidemia    Current Outpatient Medications on File Prior to Visit  Medication Sig Dispense Refill   aspirin EC 81 MG tablet Take 81 mg by mouth daily. Swallow whole.     metFORMIN (GLUCOPHAGE) 500 MG tablet Take by mouth 2 (two) times daily with a meal.     rosuvastatin (CRESTOR) 40 MG tablet Take 40 mg by mouth daily.     No current facility-administered medications on file prior to visit.     The following portions of the patient's history were reviewed and updated as appropriate: allergies, current medications, past family history, past medical history, past social history, past surgical history and problem list.  ROS Otherwise as in subjective above    Objective: BP 130/80   Pulse 93   Ht 5' 9.5" (1.765 m)   Wt 187 lb 12.8 oz (85.2 kg)   BMI 27.34 kg/m   General appearance: alert, no distress, well developed, well nourished Neck: supple, no lymphadenopathy, no thyromegaly, no masses, no bruits Heart: RRR, normal S1, S2, no murmurs Lungs: CTA bilaterally, no wheezes, rhonchi, or  rales Pulses: 2+ radial pulses, 2+ pedal pulses, normal cap refill Ext: no edema    Assessment: Encounter Diagnoses  Name Primary?   Type 2 diabetes mellitus with other specified complication, without long-term current use of insulin (HCC) Yes   Hyperlipidemia associated with type 2 diabetes mellitus (HCC)    Vaccine counseling      Plan: Diabetes type 2-controlled per patient from recent A1c under 7%.  Compliant with medication.  Primarily sees the [place] for his routine care  Hyperlipidemia associated diabetes-compliant with Crestor  Vaccine counseling-discussed vaccines which he probably had done at the [place] clinic  We had him sign for records today from the [place], including last few office notes, labs and vaccine record  We discussed acute care, routine chronic disease follow-up and he will continue to do a lot of his primary care through the [place] system  [place] was seen today for new get established.  Diagnoses and all orders for this visit:  Type 2 diabetes mellitus with other specified complication, without long-term current use of insulin (HCC)  Hyperlipidemia associated with type 2 diabetes mellitus (HCC)  Vaccine counseling    Follow up: prn

## 2022-12-25 ENCOUNTER — Telehealth: Payer: Self-pay | Admitting: Gastroenterology

## 2022-12-25 NOTE — Telephone Encounter (Signed)
Hi Dr. Barron Alvine,  Supervising Provider: 12/25/22-AM  We received a referral for patient to have a colonoscopy. 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 Records were sent to our office, they have been scanned into Media for you to review and advise on scheduling.  Thanks

## 2022-12-25 NOTE — Telephone Encounter (Signed)
Records from [facility] reviewed and notable for the following:  - 08/31/2018: EGD: Irregular Z-line at 44 cm, LA Grade A esophagitis.  Mild antral gastritis, normal duodenum.  Biopsies from [facility] with gastric type mucosa with focal rare goblet cells.  Otherwise normal biopsies from the stomach.  Recommended 1 year repeat - 08/31/2018: Colonoscopy: Mild sigmoid diverticulosis, 3 and 4 mm rectal hyperplastic polyp x 2.  Medium size internal hemorrhoids.  Performed hemorrhoid banding x 3.  Recommended flexible sigmoidoscopy in 2 months for additional banding along with repeat colonoscopy in 5 years due to history of polyps  Based on these results, in the absence of GI symptoms, he would not be due for repeat colonoscopy until 08/2023 from a colon cancer screening and polyp surveillance standpoint.  If GI symptoms or other concerns, can schedule OV.

## 2022-12-30 NOTE — Telephone Encounter (Signed)
Called patient to advise will place recall in Epic, patient has no symptoms at this time.

## 2023-02-05 ENCOUNTER — Other Ambulatory Visit: Payer: Self-pay | Admitting: Medical

## 2023-02-05 DIAGNOSIS — Z1211 Encounter for screening for malignant neoplasm of colon: Secondary | ICD-10-CM

## 2023-02-05 DIAGNOSIS — Z1212 Encounter for screening for malignant neoplasm of rectum: Secondary | ICD-10-CM

## 2023-02-18 ENCOUNTER — Encounter: Payer: Self-pay | Admitting: Family Medicine

## 2023-02-18 ENCOUNTER — Ambulatory Visit: Payer: Medicare Other | Admitting: Family Medicine

## 2023-02-18 VITALS — BP 136/80 | HR 92 | Temp 96.9°F | Ht 70.0 in | Wt 186.0 lb

## 2023-02-18 DIAGNOSIS — J069 Acute upper respiratory infection, unspecified: Secondary | ICD-10-CM

## 2023-02-18 NOTE — Progress Notes (Signed)
Chief Complaint  Patient presents with   Cough    Cough, sinus HA and facial pain and pressure. Started Friday but worsened the weekend. Sunday night and this am negative covid tests. No fever, chills or body aches.    On Friday night 10/11 he didn't feel quite right.  Started the next day with sore throat, headache, congestion, cough. No known fever, ear pain.  Throat feels better (sore just in the mornings). Has pressure between his eyes. Nasal drainage is bright yellow. Cough is productive of the same color yellow phlegm, productive throughout the day.  Feels a little winded with some activities.  No shortness of breath at rest. Still has sinus pressure, but it is improving some. Everything else has remained the same.  Tried Dayquil and theraflu severe cold (2 doses/day for the last 2 days), no significant improvement.  Had similar illness last year, which ended up turning into pneumonia.  Denies any sick contacts.  Negative home COVID tests 10/13 and again this morning.   PMH, PSH, SH reviewed  Outpatient Encounter Medications as of 02/18/2023  Medication Sig Note   Acetaminophen-DM (THERAFLU SEVERE COLD RELIEF PO) Take 1 Package by mouth as needed. 02/18/2023: Last dose last night (mixed in hot water)   aspirin EC 81 MG tablet Take 81 mg by mouth daily. Swallow whole.    metFORMIN (GLUCOPHAGE) 500 MG tablet Take by mouth 2 (two) times daily with a meal.    Pseudoephedrine-APAP-DM (DAYQUIL PO) Take 2 each by mouth as needed. 02/18/2023: Last dose yesterday 10am.   rosuvastatin (CRESTOR) 40 MG tablet Take 40 mg by mouth daily.    No facility-administered encounter medications on file as of 02/18/2023.   No Known Allergies  ROS: f/c, myalgias, n/v/d, rashes, bleeding/bruising. No chest pain, palpitations. Mild DOE, as per HPI. No edema.    PHYSICAL EXAM:  BP 136/80   Pulse 92   Temp (!) 96.9 F (36.1 C) (Tympanic)   Ht 5\' 10"  (1.778 m)   Wt 186 lb (84.4 kg)    BMI 26.69 kg/m   Well-appearing, pleasant male, in no distress. No coughing during visit, speaking easily. HEENT:  PERRL, EOMI. Conjunctiva and sclera are clear. TM's and EAC's normal. OP with mild erythema posteriorly only, no purulent drainage Sinuses are nontender.  Nasal mucosa--mild-mod edema with some clear mucus and yellow crusting noted Neck: no lymphadenopathy or mass Heart: regular rate and rhythm, no murmur Lungs: some ronchi at R base initially, which cleared with coughing deep, wet-sounding cough. Good air movement, no wheezes, rales. Neuro: cranial nerves grossly intact, normal gait, alert and oriented Psych: normal mood, affect, hygiene and grooming    ASSESSMENT/PLAN:  Acute upper respiratory infection - suspect viral, reviewed supportive measures. Reviewed s/sx bacterial infection, and to contact us for ABX if worsening, or f/u if SOB/CP, much worse  Stay well hydrated. Take Mucinex-DM 12 hour tablets, twice daily.  This will help keep the mucus and phlegm thin, and also is a cough suppressant. You may use a separate sudafed, if needed (not a combo ingredient/medications) if you need to for sinus pain. An alternative would be to try sinus rinses (either Neti-pot or a sinus rinse kit) once or twice daily, as needed for sinus pressure.  If you start having fever, worsening cough, pain with breathing, shortness of breath or other new/concerning symtpoms, return for re-evaluation. If your symptoms persist, or worsen, but none of the above symptoms, you can contact us through MyChart for an antibiotic  prescription (if symptoms are worsening rather than improving, over the next 3-4 days.

## 2023-02-18 NOTE — Patient Instructions (Signed)
Stay well hydrated. Take Mucinex-DM 12 hour tablets, twice daily.  This will help keep the mucus and phlegm thin, and also is a cough suppressant. You may use a separate sudafed, if needed (not a combo ingredient/medications) if you need to for sinus pain. An alternative would be to try sinus rinses (either Neti-pot or a sinus rinse kit) once or twice daily, as needed for sinus pressure. You can use tylenol or ibuprofen if needed for any pain or fever.  If you start having fever, worsening cough, pain with breathing, shortness of breath or other new/concerning symtpoms, return for re-evaluation. If your symptoms persist, or worsen, but none of the above symptoms, you can contact us through MyChart for an antibiotic prescription (if symptoms are worsening rather than improving, over the next 3-4 days.

## 2023-05-03 ENCOUNTER — Ambulatory Visit (INDEPENDENT_AMBULATORY_CARE_PROVIDER_SITE_OTHER): Payer: Medicare Other | Admitting: Medical

## 2023-05-03 ENCOUNTER — Encounter: Payer: Self-pay | Admitting: Medical

## 2023-05-03 VITALS — BP 112/72 | HR 99 | Ht 69.0 in | Wt 188.6 lb

## 2023-05-03 DIAGNOSIS — E1165 Type 2 diabetes mellitus with hyperglycemia: Secondary | ICD-10-CM

## 2023-05-03 DIAGNOSIS — Z125 Encounter for screening for malignant neoplasm of prostate: Secondary | ICD-10-CM | POA: Diagnosis not present

## 2023-05-03 DIAGNOSIS — Z0000 Encounter for general adult medical examination without abnormal findings: Secondary | ICD-10-CM

## 2023-05-03 DIAGNOSIS — F17200 Nicotine dependence, unspecified, uncomplicated: Secondary | ICD-10-CM | POA: Insufficient documentation

## 2023-05-03 DIAGNOSIS — E559 Vitamin D deficiency, unspecified: Secondary | ICD-10-CM | POA: Diagnosis not present

## 2023-05-03 DIAGNOSIS — Z7185 Encounter for immunization safety counseling: Secondary | ICD-10-CM | POA: Diagnosis not present

## 2023-05-03 DIAGNOSIS — Z7189 Other specified counseling: Secondary | ICD-10-CM

## 2023-05-03 DIAGNOSIS — Z1389 Encounter for screening for other disorder: Secondary | ICD-10-CM

## 2023-05-03 DIAGNOSIS — Z122 Encounter for screening for malignant neoplasm of respiratory organs: Secondary | ICD-10-CM | POA: Diagnosis not present

## 2023-05-03 DIAGNOSIS — E785 Hyperlipidemia, unspecified: Secondary | ICD-10-CM

## 2023-05-03 DIAGNOSIS — Z136 Encounter for screening for cardiovascular disorders: Secondary | ICD-10-CM | POA: Diagnosis not present

## 2023-05-03 LAB — POCT URINALYSIS DIP (PROADVANTAGE DEVICE)
Bilirubin, UA: NEGATIVE
Blood, UA: NEGATIVE
Glucose, UA: NEGATIVE mg/dL
Ketones, POC UA: NEGATIVE mg/dL
Nitrite, UA: NEGATIVE
Protein Ur, POC: NEGATIVE mg/dL
Specific Gravity, Urine: 1.02
Urobilinogen, Ur: NEGATIVE
pH, UA: 6 (ref 5.0–8.0)

## 2023-05-03 LAB — LIPID PANEL

## 2023-05-03 NOTE — Progress Notes (Signed)
Subjective:   HPI  Oscar Myers is a 67 y.o. male who presents for Chief Complaint  Patient presents with   Annual Exam    Fasting cpe, no concerns    Patient Care Team: Davielle Lingelbach, Cleda Mccreedy as PCP - General (Family Medicine) Dr. Eleanora Neighbor, GI, bethany Dentist Eye doctor   Concerns: Here for well visit  Fasting today  He had a flu shot recently at another office  [facility]  He just saw them a few months ago for med check  He continues to smoke.  40-pack-year history.  Smokes about 1/3 pack a day.  No current dyspnea or shortness of breath.  He does not recall prior AAA screen, lung cancer CT screen.  He denies claudication.  He notes last EKG probably 3 years ago.  No current shortness of breath or dyspnea with exercise.  No edema.  Diabetes-compliant with metformin 500 mg twice daily  High cholesterol-compliant with Crestor 40 mg daily  Reviewed their medical, surgical, family, social, medication, and allergy history and updated chart as appropriate.  No Known Allergies  Past Medical History:  Diagnosis Date   CTS (carpal tunnel syndrome)    Diabetes mellitus without complication (HCC)    Dyslipidemia    Peripheral vascular disease (HCC)    Smoker     Current Outpatient Medications on File Prior to Visit  Medication Sig Dispense Refill   aspirin EC 81 MG tablet Take 81 mg by mouth daily. Swallow whole.     metFORMIN (GLUCOPHAGE) 500 MG tablet Take by mouth 2 (two) times daily with a meal.     rosuvastatin (CRESTOR) 40 MG tablet Take 40 mg by mouth daily.     No current facility-administered medications on file prior to visit.      Current Outpatient Medications:    aspirin EC 81 MG tablet, Take 81 mg by mouth daily. Swallow whole., Disp: , Rfl:    metFORMIN (GLUCOPHAGE) 500 MG tablet, Take by mouth 2 (two) times daily with a meal., Disp: , Rfl:    rosuvastatin (CRESTOR) 40 MG tablet, Take  40 mg by mouth daily., Disp: , Rfl:   History reviewed. No pertinent family history.  Past Surgical History:  Procedure Laterality Date   BACK SURGERY     CARPAL TUNNEL RELEASE Left 04/30/2016   Procedure: LEFT CARPAL TUNNEL RELEASE ENDOSCOPIC;  Surgeon: Sheral Apley, MD;  Location: [facility];  Service: Orthopedics;  Laterality: Left;   CARPAL TUNNEL RELEASE Left 01/10/2020   Procedure: LEFT CARPAL TUNNEL RELEASE;  Surgeon: Tarry Kos, MD;  Location: [facility];  Service: Orthopedics;  Laterality: Left;   COLONOSCOPY  05/04/2010   COLONOSCOPY  08/31/2018   repeat 5-10 years.  Dr. Eleanora Neighbor, [facility]   SHOULDER ARTHROSCOPY WITH ROTATOR CUFF REPAIR AND SUBACROMIAL DECOMPRESSION Left 01/10/2020   Procedure: LEFT SHOULDER ARTHROSCOPY WITH EXTENSIVE DEBRIDEMENT, SUBACROMIAL DECOMPRESSION, DISTAL CLAVICLE EXCISION, MANIPULATION UNDER ANESTHESIA;  Surgeon: Tarry Kos, MD;  Location: [facility];  Service: Orthopedics;  Laterality: Left;    Review of Systems  Constitutional:  Negative for chills, fever, malaise/fatigue and weight loss.  HENT:  Negative for congestion, ear pain, hearing loss, sore throat and tinnitus.   Eyes:  Negative for blurred vision, pain and redness.  Respiratory:  Negative for cough, hemoptysis and shortness of breath.   Cardiovascular:  Negative for chest pain, palpitations, orthopnea, claudication and leg  swelling.  Gastrointestinal:  Negative for abdominal pain, blood in stool, constipation, diarrhea, nausea and vomiting.  Genitourinary:  Negative for dysuria, flank pain, frequency, hematuria and urgency.  Musculoskeletal:  Negative for falls, joint pain and myalgias.  Skin:  Negative for itching and rash.  Neurological:  Negative for dizziness, tingling, speech change, weakness and headaches.  Endo/Heme/Allergies:  Negative for polydipsia. Does not bruise/bleed easily.  Psychiatric/Behavioral:   Negative for depression and memory loss. The patient is not nervous/anxious and does not have insomnia.        Objective:  BP 112/72   Pulse 99   Ht 5\' 9"  (1.753 m)   Wt 188 lb 9.6 oz (85.5 kg)   BMI 27.85 kg/m   General appearance: alert, no distress, WD/WN, African American male Skin: unremarkable HEENT: normocephalic, conjunctiva/corneas normal, sclerae anicteric, PERRLA, EOMi, nares patent, no discharge or erythema, pharynx normal Oral cavity: MMM, tongue normal, teeth in good repair Neck: supple, no lymphadenopathy, no thyromegaly, no masses, normal ROM, no bruits Chest: non tender, normal shape and expansion Heart: RRR, normal S1, S2, no murmurs Lungs: CTA bilaterally, no wheezes, rhonchi, or rales Abdomen: +bs, soft, non tender, non distended, no masses, no hepatomegaly, no splenomegaly, no bruits Back: non tender, normal ROM, no scoliosis Musculoskeletal: upper extremities non tender, no obvious deformity, normal ROM throughout, lower extremities non tender, no obvious deformity, normal ROM throughout Extremities: no edema, no cyanosis, no clubbing Pulses: 2+ symmetric, upper and lower extremities, normal cap refill Neurological: alert, oriented x 3, CN2-12 intact, strength normal upper extremities and lower extremities, sensation normal throughout, DTRs 2+ throughout, no cerebellar signs, gait normal Psychiatric: normal affect, behavior normal, pleasant  GU/rectal - declined  Diabetic Foot Exam - Simple   Simple Foot Form Diabetic Foot exam was performed with the following findings: Yes 05/03/2023 11:14 AM  Visual Inspection See comments: Yes Sensation Testing Intact to touch and monofilament testing bilaterally: Yes Pulse Check Posterior Tibialis and Dorsalis pulse intact bilaterally: Yes Comments There is some mild yellowish coloration of the great toes and a few of the toes suggestive of possible onychomycosis      Assessment and Plan :   Encounter Diagnoses   Name Primary?   Encounter for health maintenance examination in adult Yes   Vaccine counseling    Screening for prostate cancer    Hyperlipidemia, unspecified hyperlipidemia type    Smoker    Screening for lung cancer    Screening for AAA (abdominal aortic aneurysm)    Vitamin D deficiency    Screening for hematuria or proteinuria    Type 2 diabetes mellitus with hyperglycemia, without long-term current use of insulin (HCC)    Advanced directives, counseling/discussion     This visit was a preventative care visit, also known as wellness visit or routine physical.   Topics typically include healthy lifestyle, diet, exercise, preventative care, vaccinations, sick and well care, proper use of emergency dept and after hours care, as well as other concerns.     Separate significant issues discussed: Hyperlipidemia-currently on Crestor 40 mg daily.  Updated labs today fasting.  Continue aspirin 81 mg daily  Diabetes type 2 I recommend you check your blood sugars several days.  Fasting in the morning, goal 80-130 in the morning Exercise most days per week at least 40 to 60 minutes at a time Eat a healthy low sugar low-cholesterol diet See your eye doctor yearly for diabetic eye exam and make sure they send [place] a copy of the  note Check your feet daily for sores or wounds that are not healing  Smoker I strongly recommend you quit smoking given the risk of COPD, cancer, heart disease and other risk  Order today for CT chest for lung cancer screening.  This is a yearly recommendation  Vitamin D deficiency-not currently on vitamin D supplement.  Updated labs today    General Recommendations: Continue to return yearly for your annual wellness and preventative care visits.  This gives [place] a chance to discuss healthy lifestyle, exercise, vaccinations, review your chart record, and perform screenings where appropriate.  I recommend you see your eye doctor yearly for routine vision care.  I  recommend you see your dentist yearly for routine dental care including hygiene visits twice yearly.   Vaccination  Immunization History  Administered Date(s) Administered   Influenza,inj,Quad PF,6+ Mos 02/06/2019, 11/28/2019, 07/24/2020, 02/21/2021   Influenza-Unspecified 03/04/2022   Moderna Covid-19 Fall Seasonal Vaccine 26yrs & older 04/16/2022, 01/26/2023   Moderna Covid-19 Vaccine Bivalent Booster 12yrs & up 02/21/2021   Moderna Sars-Covid-2 Vaccination 06/13/2019, 07/11/2019, 03/21/2020   PNEUMOCOCCAL CONJUGATE-20 04/21/2021   Pneumococcal Polysaccharide-23 10/25/2017   Tdap 07/22/2010, 10/30/2016   Zoster Recombinant(Shingrix) 08/28/2019, 11/28/2019    Screening for cancer: Colon cancer screening: Prior or last colon cancer screen: 2020, due repeat in 5-10 years  Prostate Cancer screening: The recommended prostate cancer screening test is a blood test called the prostate-specific antigen (PSA) test. PSA is a protein that is made in the prostate. As you age, your prostate naturally produces more PSA. Abnormally high PSA levels may be caused by: Prostate cancer. An enlarged prostate that is not caused by cancer (benign prostatic hyperplasia, or BPH). This condition is very common in older men. A prostate gland infection (prostatitis) or urinary tract infection. Certain medicines such as male hormones (like testosterone) or other medicines that raise testosterone levels. A rectal exam may be done as part of prostate cancer screening to help provide information about the size of your prostate gland. When a rectal exam is performed, it should be done after the PSA level is drawn to avoid any effect on the results.   Skin cancer screening: Check your skin regularly for new changes, growing lesions, or other lesions of concern Come in for evaluation if you have skin lesions of concern.   Lung cancer screening: If you have a greater than 20 pack year history of tobacco use, then  you may qualify for lung cancer screening with a chest CT scan.   Please call your insurance company to inquire about coverage for this test.   Pancreatic cancer:  no current screening test is available or routinely recommended. (risk factors: smoking, overweight or obese, diabetes, chronic pancreatitis, work exposure - dry cleaning, metal working, 67yo>, M>F, Tree surgeon, family hx/o, hereditary breast, ovarian, melanoma, lynch, peutz-jeghers).  Symptoms: jaundice, dark urine, light color or greasy stools, itchy skin, belly or back pain, weight loss, poor appetite, nausea, vomiting, liver enlargement, DVT/blood clots.   We currently don't have screenings for other cancers besides breast, cervical, colon, and lung cancers.  If you have a strong family history of cancer or have other cancer screening concerns, please let me know.  Genetic testing referral is an option for individuals with high cancer risk in the family.  There are some other cancer screenings in development currently.   Bone health: Get at least 150 minutes of aerobic exercise weekly Get weight bearing exercise at least once weekly Bone density test:  A  bone density test is an imaging test that uses a type of X-ray to measure the amount of calcium and other minerals in your bones. The test may be used to diagnose or screen you for a condition that causes weak or thin bones (osteoporosis), predict your risk for a broken bone (fracture), or determine how well your osteoporosis treatment is working. The bone density test is recommended for females 65 and older, or females or males <65 if certain risk factors such as thyroid disease, long term use of steroids such as for asthma or rheumatological issues, vitamin D deficiency, estrogen deficiency, family history of osteoporosis, self or family history of fragility fracture in first degree relative.    Heart health: Get at least 150 minutes of aerobic exercise weekly Limit  alcohol It is important to maintain a healthy blood pressure and healthy cholesterol numbers  Heart disease screening: Screening for heart disease includes screening for blood pressure, fasting lipids, glucose/diabetes screening, BMI height to weight ratio, reviewed of smoking status, physical activity, and diet.    Goals include blood pressure 120/80 or less, maintaining a healthy lipid/cholesterol profile, preventing diabetes or keeping diabetes numbers under good control, not smoking or using tobacco products, exercising most days per week or at least 150 minutes per week of exercise, and eating healthy variety of fruits and vegetables, healthy oils, and avoiding unhealthy food choices like fried food, fast food, high sugar and high cholesterol foods.    Other tests may possibly include EKG test, CT coronary calcium score, echocardiogram, exercise treadmill stress test.      Vascular disease screening: For higher risk individuals including smokers, diabetics, patients with known heart disease or high blood pressure, kidney disease, and others, screening for vascular disease or atherosclerosis of the arteries is available.  Examples may include carotid ultrasound, abdominal aortic ultrasound, ABI blood flow screening in the legs, thoracic aorta screening.  I recommend vascular screening including screening for abdominal aortic aneurysm, carotid ultrasound screening.  We can need to do these separately as orders or you could consider a company called [employer] who does similar tests for a little bit under $200   Medical care options: I recommend you continue to seek care here first for routine care.  We try really hard to have available appointments Monday through Friday daytime hours for sick visits, acute visits, and physicals.  Urgent care should be used for after hours and weekends for significant issues that cannot wait till the next day.  The emergency department should be used for  significant potentially life-threatening emergencies.  The emergency department is expensive, can often have long wait times for less significant concerns, so try to utilize primary care, urgent care, or telemedicine when possible to avoid unnecessary trips to the emergency department.  Virtual visits and telemedicine have been introduced since the pandemic started in 2020, and can be convenient ways to receive medical care.  We offer virtual appointments as well to assist you in a variety of options to seek medical care.   Legal  Take the time to do a last will and testament, Advanced Directives including Health Care Power of Attorney and Living Will documents.  Don't leave your family with burdens that can be handled ahead of time.   Advanced Directives: I recommend you consider completing a Health Care Power of Attorney and Living Will.   These documents respect your wishes and help alleviate burdens on your loved ones if you were to become terminally ill or be in  a position to need those documents enforced.    You can complete Advanced Directives yourself, have them notarized, then have copies made for our office, for you and for anybody you feel should have them in safe keeping.  Or, you can have an attorney prepare these documents.   If you haven't updated your Last Will and Testament in a while, it may be worthwhile having an attorney prepare these documents together and save on some costs.       Spiritual and Emotional Health Keeping a healthy spiritual life can help you better manage your physical health. Your spiritual life can help you to cope with any issues that may arise with your physical health.  Balance can keep [place] healthy and help [place] to recover.  If you are struggling with your spiritual health there are questions that you may want to ask yourself:  What makes me feel most complete? When do I feel most connected to the rest of the world? Where do I find the most inner  strength? What am I doing when I feel whole?  Helpful tips: Being in nature. Some people feel very connected and at peace when they are walking outdoors or are outside. Helping others. Some feel the largest sense of wellbeing when they are of service to others. Being of service can take on many forms. It can be doing volunteer work, being kind to strangers, or offering a hand to a friend in need. Gratitude. Some people find they feel the most connected when they remain grateful. They may make lists of all the things they are grateful for or say a thank you out loud for all they have.    Emotional Health Are you in tune with your emotional health?  Check out this link: http://www.marquez-love.[email or web address]/    Financial Health Make sure you use a budget for your personal finances Make sure you are insured against risks (health insurance, life insurance, auto insurance, etc) Save more, spend less Set financial goals If you need help in this area, good resources include counseling through [employer] or other community resources, have a meeting with a Social research officer, government, and a good resource is the [employer]    Oscar Myers was seen today for annual exam.  Diagnoses and all orders for this visit:  Encounter for health maintenance examination in adult -     Comprehensive metabolic panel -     CBC with Differential/Platelet -     Lipid panel -     Hemoglobin A1c -     Microalbumin/Creatinine Ratio, Urine -     VITAMIN D 25 Hydroxy (Vit-D Deficiency, Fractures) -     PSA -     POCT Urinalysis DIP (Proadvantage Device)  Vaccine counseling  Screening for prostate cancer -     PSA  Hyperlipidemia, unspecified hyperlipidemia type -     Lipid panel  Smoker -     CT CHEST LUNG CA SCREEN LOW DOSE W/O CM; Future  Screening for lung cancer -     CT CHEST LUNG CA SCREEN LOW DOSE W/O CM; Future  Screening for AAA (abdominal aortic aneurysm)  Vitamin D  deficiency  Screening for hematuria or proteinuria -     POCT Urinalysis DIP (Proadvantage Device)  Type 2 diabetes mellitus with hyperglycemia, without long-term current use of insulin (HCC) -     Hemoglobin A1c -     Microalbumin/Creatinine Ratio, Urine  Advanced directives, counseling/discussion    Follow-up pending labs, yearly  for physical

## 2023-05-03 NOTE — Patient Instructions (Signed)
This visit was a preventative care visit, also known as wellness visit or routine physical.   Topics typically include healthy lifestyle, diet, exercise, preventative care, vaccinations, sick and well care, proper use of emergency dept and after hours care, as well as other concerns.     Separate significant issues discussed: Hyperlipidemia-currently on Crestor 40 mg daily.  Updated labs today fasting.  Continue aspirin 81 mg daily  Diabetes type 2 I recommend you check your blood sugars several days.  Fasting in the morning, goal 80-130 in the morning Exercise most days per week at least 40 to 60 minutes at a time Eat a healthy low sugar low-cholesterol diet See your eye doctor yearly for diabetic eye exam and make sure they send [place] a copy of the note Check your feet daily for sores or wounds that are not healing  Smoker I strongly recommend you quit smoking given the risk of COPD, cancer, heart disease and other risk  Order today for CT chest for lung cancer screening.  This is a yearly recommendation  Vitamin D deficiency-not currently on vitamin D supplement.  Updated labs today    General Recommendations: Continue to return yearly for your annual wellness and preventative care visits.  This gives [place] a chance to discuss healthy lifestyle, exercise, vaccinations, review your chart record, and perform screenings where appropriate.  I recommend you see your eye doctor yearly for routine vision care.  I recommend you see your dentist yearly for routine dental care including hygiene visits twice yearly.   Vaccination  Immunization History  Administered Date(s) Administered   Influenza,inj,Quad PF,6+ Mos 02/06/2019, 11/28/2019, 07/24/2020, 02/21/2021   Influenza-Unspecified 03/04/2022   Moderna Covid-19 Fall Seasonal Vaccine 39yrs & older 04/16/2022, 01/26/2023   Moderna Covid-19 Vaccine Bivalent Booster 53yrs & up 02/21/2021   Moderna Sars-Covid-2 Vaccination 06/13/2019,  07/11/2019, 03/21/2020   PNEUMOCOCCAL CONJUGATE-20 04/21/2021   Pneumococcal Polysaccharide-23 10/25/2017   Tdap 07/22/2010, 10/30/2016   Zoster Recombinant(Shingrix) 08/28/2019, 11/28/2019    Screening for cancer: Colon cancer screening: Prior or last colon cancer screen: 2020, due repeat in 5-10 years  Prostate Cancer screening: The recommended prostate cancer screening test is a blood test called the prostate-specific antigen (PSA) test. PSA is a protein that is made in the prostate. As you age, your prostate naturally produces more PSA. Abnormally high PSA levels may be caused by: Prostate cancer. An enlarged prostate that is not caused by cancer (benign prostatic hyperplasia, or BPH). This condition is very common in older men. A prostate gland infection (prostatitis) or urinary tract infection. Certain medicines such as male hormones (like testosterone) or other medicines that raise testosterone levels. A rectal exam may be done as part of prostate cancer screening to help provide information about the size of your prostate gland. When a rectal exam is performed, it should be done after the PSA level is drawn to avoid any effect on the results.   Skin cancer screening: Check your skin regularly for new changes, growing lesions, or other lesions of concern Come in for evaluation if you have skin lesions of concern.   Lung cancer screening: If you have a greater than 20 pack year history of tobacco use, then you may qualify for lung cancer screening with a chest CT scan.   Please call your insurance company to inquire about coverage for this test.   Pancreatic cancer:  no current screening test is available or routinely recommended. (risk factors: smoking, overweight or obese, diabetes, chronic pancreatitis, work exposure -  dry cleaning, metal working, 66yo>, M>F, African American, family hx/o, hereditary breast, ovarian, melanoma, lynch, peutz-jeghers).  Symptoms: jaundice, dark  urine, light color or greasy stools, itchy skin, belly or back pain, weight loss, poor appetite, nausea, vomiting, liver enlargement, DVT/blood clots.   We currently don't have screenings for other cancers besides breast, cervical, colon, and lung cancers.  If you have a strong family history of cancer or have other cancer screening concerns, please let me know.  Genetic testing referral is an option for individuals with high cancer risk in the family.  There are some other cancer screenings in development currently.   Bone health: Get at least 150 minutes of aerobic exercise weekly Get weight bearing exercise at least once weekly Bone density test:  A bone density test is an imaging test that uses a type of X-ray to measure the amount of calcium and other minerals in your bones. The test may be used to diagnose or screen you for a condition that causes weak or thin bones (osteoporosis), predict your risk for a broken bone (fracture), or determine how well your osteoporosis treatment is working. The bone density test is recommended for females 65 and older, or females or males <65 if certain risk factors such as thyroid disease, long term use of steroids such as for asthma or rheumatological issues, vitamin D deficiency, estrogen deficiency, family history of osteoporosis, self or family history of fragility fracture in first degree relative.    Heart health: Get at least 150 minutes of aerobic exercise weekly Limit alcohol It is important to maintain a healthy blood pressure and healthy cholesterol numbers  Heart disease screening: Screening for heart disease includes screening for blood pressure, fasting lipids, glucose/diabetes screening, BMI height to weight ratio, reviewed of smoking status, physical activity, and diet.    Goals include blood pressure 120/80 or less, maintaining a healthy lipid/cholesterol profile, preventing diabetes or keeping diabetes numbers under good control, not  smoking or using tobacco products, exercising most days per week or at least 150 minutes per week of exercise, and eating healthy variety of fruits and vegetables, healthy oils, and avoiding unhealthy food choices like fried food, fast food, high sugar and high cholesterol foods.    Other tests may possibly include EKG test, CT coronary calcium score, echocardiogram, exercise treadmill stress test.      Vascular disease screening: For higher risk individuals including smokers, diabetics, patients with known heart disease or high blood pressure, kidney disease, and others, screening for vascular disease or atherosclerosis of the arteries is available.  Examples may include carotid ultrasound, abdominal aortic ultrasound, ABI blood flow screening in the legs, thoracic aorta screening.  I recommend vascular screening including screening for abdominal aortic aneurysm, carotid ultrasound screening.  We can need to do these separately as orders or you could consider a company called [employer] who does similar tests for a little bit under $200   Medical care options: I recommend you continue to seek care here first for routine care.  We try really hard to have available appointments Monday through Friday daytime hours for sick visits, acute visits, and physicals.  Urgent care should be used for after hours and weekends for significant issues that cannot wait till the next day.  The emergency department should be used for significant potentially life-threatening emergencies.  The emergency department is expensive, can often have long wait times for less significant concerns, so try to utilize primary care, urgent care, or telemedicine when possible to avoid  unnecessary trips to the emergency department.  Virtual visits and telemedicine have been introduced since the pandemic started in 2020, and can be convenient ways to receive medical care.  We offer virtual appointments as well to assist you in a variety of  options to seek medical care.   Legal  Take the time to do a last will and testament, Advanced Directives including Health Care Power of Attorney and Living Will documents.  Don't leave your family with burdens that can be handled ahead of time.   Advanced Directives: I recommend you consider completing a Health Care Power of Attorney and Living Will.   These documents respect your wishes and help alleviate burdens on your loved ones if you were to become terminally ill or be in a position to need those documents enforced.    You can complete Advanced Directives yourself, have them notarized, then have copies made for our office, for you and for anybody you feel should have them in safe keeping.  Or, you can have an attorney prepare these documents.   If you haven't updated your Last Will and Testament in a while, it may be worthwhile having an attorney prepare these documents together and save on some costs.       Spiritual and Emotional Health Keeping a healthy spiritual life can help you better manage your physical health. Your spiritual life can help you to cope with any issues that may arise with your physical health.  Balance can keep [place] healthy and help [place] to recover.  If you are struggling with your spiritual health there are questions that you may want to ask yourself:  What makes me feel most complete? When do I feel most connected to the rest of the world? Where do I find the most inner strength? What am I doing when I feel whole?  Helpful tips: Being in nature. Some people feel very connected and at peace when they are walking outdoors or are outside. Helping others. Some feel the largest sense of wellbeing when they are of service to others. Being of service can take on many forms. It can be doing volunteer work, being kind to strangers, or offering a hand to a friend in need. Gratitude. Some people find they feel the most connected when they remain grateful. They may make  lists of all the things they are grateful for or say a thank you out loud for all they have.    Emotional Health Are you in tune with your emotional health?  Check out this link: http://www.marquez-love.com/    Financial Health Make sure you use a budget for your personal finances Make sure you are insured against risks (health insurance, life insurance, auto insurance, etc) Save more, spend less Set financial goals If you need help in this area, good resources include counseling through [employer] or other community resources, have a meeting with a Social research officer, government, and a good resource is the [employer]

## 2023-05-04 ENCOUNTER — Other Ambulatory Visit: Payer: Self-pay | Admitting: Medical

## 2023-05-04 LAB — CBC WITH DIFFERENTIAL/PLATELET
Basophils Absolute: 0 10*3/uL (ref 0.0–0.2)
Basos: 1 %
EOS (ABSOLUTE): 0.1 10*3/uL (ref 0.0–0.4)
Eos: 1 %
Hematocrit: 38.9 % (ref 37.5–51.0)
Hemoglobin: 12.7 g/dL — ABNORMAL LOW (ref 13.0–17.7)
Immature Grans (Abs): 0 10*3/uL (ref 0.0–0.1)
Immature Granulocytes: 1 %
Lymphocytes Absolute: 2 10*3/uL (ref 0.7–3.1)
Lymphs: 45 %
MCH: 29.2 pg (ref 26.6–33.0)
MCHC: 32.6 g/dL (ref 31.5–35.7)
MCV: 89 fL (ref 79–97)
Monocytes Absolute: 0.3 10*3/uL (ref 0.1–0.9)
Monocytes: 6 %
Neutrophils Absolute: 2 10*3/uL (ref 1.4–7.0)
Neutrophils: 46 %
Platelets: 147 10*3/uL — ABNORMAL LOW (ref 150–450)
RBC: 4.35 x10E6/uL (ref 4.14–5.80)
RDW: 12.2 % (ref 11.6–15.4)
WBC: 4.3 10*3/uL (ref 3.4–10.8)

## 2023-05-04 LAB — COMPREHENSIVE METABOLIC PANEL
ALT: 15 IU/L (ref 0–44)
AST: 17 IU/L (ref 0–40)
Albumin: 4.4 g/dL (ref 3.9–4.9)
Alkaline Phosphatase: 91 IU/L (ref 44–121)
BUN/Creatinine Ratio: 9 — ABNORMAL LOW (ref 10–24)
BUN: 7 mg/dL — ABNORMAL LOW (ref 8–27)
Bilirubin Total: 0.5 mg/dL (ref 0.0–1.2)
CO2: 25 mmol/L (ref 20–29)
Calcium: 10.5 mg/dL — ABNORMAL HIGH (ref 8.6–10.2)
Chloride: 103 mmol/L (ref 96–106)
Creatinine, Ser: 0.81 mg/dL (ref 0.76–1.27)
Globulin, Total: 2.2 g/dL (ref 1.5–4.5)
Glucose: 109 mg/dL — ABNORMAL HIGH (ref 70–99)
Potassium: 4.3 mmol/L (ref 3.5–5.2)
Sodium: 142 mmol/L (ref 134–144)
Total Protein: 6.6 g/dL (ref 6.0–8.5)
eGFR: 97 mL/min/{1.73_m2} (ref >59)

## 2023-05-04 LAB — LIPID PANEL
Cholesterol, Total: 147 mg/dL (ref 100–199)
HDL: 44 mg/dL (ref >39)
LDL CALC COMMENT:: 3.3 ratio (ref 0.0–5.0)
LDL Chol Calc (NIH): 83 mg/dL (ref 0–99)
Triglycerides: 112 mg/dL (ref 0–149)
VLDL Cholesterol Cal: 20 mg/dL (ref 5–40)

## 2023-05-04 LAB — PSA: Prostate Specific Ag, Serum: 2.1 ng/mL (ref 0.0–4.0)

## 2023-05-04 LAB — VITAMIN D 25 HYDROXY (VIT D DEFICIENCY, FRACTURES): Vit D, 25-Hydroxy: 13.4 ng/mL — ABNORMAL LOW (ref 30.0–100.0)

## 2023-05-04 LAB — MICROALBUMIN / CREATININE URINE RATIO
Creatinine, Urine: 229.9 mg/dL
Microalb/Creat Ratio: 4 mg/g{creat} (ref 0–29)
Microalbumin, Urine: 9.9 ug/mL

## 2023-05-04 LAB — HEMOGLOBIN A1C
Est. average glucose Bld gHb Est-mCnc: 146 mg/dL
Hgb A1c MFr Bld: 6.7 % — ABNORMAL HIGH (ref 4.8–5.6)

## 2023-05-04 MED ORDER — VITAMIN D (ERGOCALCIFEROL) 1.25 MG (50000 UNIT) PO CAPS
50000.0000 [IU] | ORAL_CAPSULE | ORAL | 0 refills | Status: AC
Start: 2023-05-04 — End: ?

## 2023-05-04 NOTE — Progress Notes (Signed)
Schedule 3 month fasting follow-up.  Results sent through [facility].

## 2023-05-06 ENCOUNTER — Telehealth: Payer: Self-pay | Admitting: Medical

## 2023-05-06 NOTE — Telephone Encounter (Signed)
 Oscar Myers asks if you can call him back about his lab results.

## 2023-05-06 NOTE — Telephone Encounter (Signed)
 Pt saw his lab results through [facility]

## 2023-05-07 ENCOUNTER — Other Ambulatory Visit: Payer: Self-pay | Admitting: Medical

## 2023-05-07 MED ORDER — VARENICLINE TARTRATE (STARTER) 0.5 MG X 11 & 1 MG X 42 PO TBPK: ORAL_TABLET | ORAL | 0 refills | Status: DC

## 2023-05-20 ENCOUNTER — Ambulatory Visit
Admission: RE | Admit: 2023-05-20 | Discharge: 2023-05-20 | Disposition: A | Discharge status: Home / Self Care | Payer: Medicare Other | Source: Ambulatory Visit | Attending: Medical | Admitting: Medical

## 2023-05-20 DIAGNOSIS — F17200 Nicotine dependence, unspecified, uncomplicated: Secondary | ICD-10-CM

## 2023-05-20 DIAGNOSIS — Z87891 Personal history of nicotine dependence: Secondary | ICD-10-CM | POA: Diagnosis not present

## 2023-05-20 DIAGNOSIS — Z122 Encounter for screening for malignant neoplasm of respiratory organs: Secondary | ICD-10-CM

## 2023-05-25 NOTE — Progress Notes (Signed)
Results sent through MyChart

## 2023-09-20 ENCOUNTER — Emergency Department (HOSPITAL_BASED_OUTPATIENT_CLINIC_OR_DEPARTMENT_OTHER)

## 2023-09-20 ENCOUNTER — Emergency Department (HOSPITAL_BASED_OUTPATIENT_CLINIC_OR_DEPARTMENT_OTHER)
Admission: EM | Admit: 2023-09-20 | Discharge: 2023-09-20 | Disposition: A | Discharge status: Home / Self Care | Attending: Emergency Medicine | Admitting: Emergency Medicine

## 2023-09-20 ENCOUNTER — Other Ambulatory Visit: Payer: Self-pay

## 2023-09-20 DIAGNOSIS — M4802 Spinal stenosis, cervical region: Secondary | ICD-10-CM | POA: Diagnosis not present

## 2023-09-20 DIAGNOSIS — M542 Cervicalgia: Secondary | ICD-10-CM | POA: Diagnosis not present

## 2023-09-20 DIAGNOSIS — M5030 Other cervical disc degeneration, unspecified cervical region: Secondary | ICD-10-CM | POA: Diagnosis not present

## 2023-09-20 DIAGNOSIS — S199XXA Unspecified injury of neck, initial encounter: Secondary | ICD-10-CM | POA: Diagnosis not present

## 2023-09-20 DIAGNOSIS — M47812 Spondylosis without myelopathy or radiculopathy, cervical region: Secondary | ICD-10-CM | POA: Diagnosis not present

## 2023-09-20 MED ORDER — ACETAMINOPHEN 500 MG PO TABS: 1000.0000 mg | ORAL_TABLET | Freq: Three times a day (TID) | ORAL | 0 refills | Status: DC | PRN

## 2023-09-20 MED ORDER — OXYCODONE HCL 5 MG PO TABS: 5.0000 mg | ORAL_TABLET | Freq: Once | ORAL | Status: DC

## 2023-09-20 MED ORDER — CELECOXIB 200 MG PO CAPS: 200.0000 mg | ORAL_CAPSULE | Freq: Two times a day (BID) | ORAL | 0 refills | Status: DC

## 2023-09-20 MED ORDER — KETOROLAC TROMETHAMINE 15 MG/ML IJ SOLN
15.0000 mg | Freq: Once | INTRAMUSCULAR | Status: AC
  Administered 2023-09-20: 15 mg via INTRAMUSCULAR
  Filled 2023-09-20: qty 1

## 2023-09-20 MED ORDER — ACETAMINOPHEN 500 MG PO TABS
1000.0000 mg | ORAL_TABLET | Freq: Once | ORAL | Status: AC
  Administered 2023-09-20: 1000 mg via ORAL
  Filled 2023-09-20: qty 2

## 2023-09-20 MED ORDER — OXYCODONE HCL 5 MG PO TABS
10.0000 mg | ORAL_TABLET | Freq: Once | ORAL | Status: AC
  Administered 2023-09-20: 10 mg via ORAL
  Filled 2023-09-20: qty 2

## 2023-09-20 MED ORDER — OXYCODONE HCL 5 MG PO TABS
5.0000 mg | ORAL_TABLET | Freq: Four times a day (QID) | ORAL | 0 refills | Status: DC | PRN
Start: 2023-09-20 — End: 2023-09-23

## 2023-09-20 NOTE — ED Provider Notes (Signed)
 [place] EMERGENCY DEPARTMENT AT [facility] Provider Note   CSN: [id number] Arrival date & time: 09/20/23  0050     History Chief Complaint  Patient presents with   Neck Pain    HPI Oscar Myers is a 68 y.o. male presenting for neck pain. History of recurrent disc herniations.  States that he woke up this morning with severe left-sided radiating neck pain.  Denies fevers chills nausea vomiting syncope shortness of breath.  He is otherwise ambulatory tolerating p.o. intake.  Took Zanaflex earlier today without any symptomatic change.  Patient's recorded medical, surgical, social, medication list and allergies were reviewed in the Snapshot window as part of the initial history.   Review of Systems   Review of Systems  Constitutional:  Negative for chills and fever.  HENT:  Negative for ear pain and sore throat.   Eyes:  Negative for pain and visual disturbance.  Respiratory:  Negative for cough and shortness of breath.   Cardiovascular:  Negative for chest pain and palpitations.  Gastrointestinal:  Negative for abdominal pain and vomiting.  Genitourinary:  Negative for dysuria and hematuria.  Musculoskeletal:  Positive for neck pain. Negative for arthralgias and back pain.  Skin:  Negative for color change and rash.  Neurological:  Negative for seizures and syncope.  All other systems reviewed and are negative.   Physical Exam Updated Vital Signs BP (!) 159/91 (BP Location: Right Arm)   Pulse (!) 107   Temp 97.9 F (36.6 C)   Resp 16   Ht 5\' 10"  (1.778 m)   Wt 83.9 kg   SpO2 96%   BMI 26.54 kg/m  Physical Exam Vitals and nursing note reviewed.  Constitutional:      General: He is not in acute distress.    Appearance: He is well-developed.  HENT:     Head: Normocephalic and atraumatic.  Eyes:     Conjunctiva/sclera: Conjunctivae normal.  Cardiovascular:     Rate and Rhythm: Normal rate and regular rhythm.     Heart sounds: No murmur  heard. Pulmonary:     Effort: Pulmonary effort is normal. No respiratory distress.     Breath sounds: Normal breath sounds.  Abdominal:     Palpations: Abdomen is soft.     Tenderness: There is no abdominal tenderness.  Musculoskeletal:        General: No swelling.     Cervical back: Neck supple.  Skin:    General: Skin is warm and dry.     Capillary Refill: Capillary refill takes less than 2 seconds.  Neurological:     General: No focal deficit present.     Mental Status: He is alert and oriented to person, place, and time. Mental status is at baseline.     Cranial Nerves: No cranial nerve deficit.     Motor: No weakness.  Psychiatric:        Mood and Affect: Mood normal.      ED Course/ Medical Decision Making/ A&P    Procedures Procedures   Medications Ordered in ED Medications  acetaminophen  (TYLENOL ) tablet 1,000 mg (1,000 mg Oral Given 09/20/23 0157)  ketorolac  (TORADOL ) 15 MG/ML injection 15 mg (15 mg Intramuscular Given 09/20/23 0158)  oxyCODONE  (Oxy IR/ROXICODONE ) immediate release tablet 10 mg (10 mg Oral Given 09/20/23 0158)   Medical Decision Making:   Oscar Myers is a 68 y.o. male who presented to the ED today with acute cervical back pain over the past 48 hours, detailed  above.    Additional history discussed with patient's family/caregivers.   On my initial exam, the pt was with an intact neurologic exam, tolerating ambulation with an antalgic gait and p.o. intake without difficulty.  Patient had no abnormal DTRs, no midline spinal tenderness.  Patient endorsing complete sensation of the perineum.  Patient without episodes of fecal or urinary incontinence.  Patient has no focal neurologic deficits and reassuring vital signs at this time.  No obvious physical abnormality or injury on exam. Notably, patient denies recent trauma, is afebrile, and denies IVDU.   Reviewed and confirmed nursing documentation for past medical history, family history, social  history.    Initial Assessment:   With the patient's presentation of acute neck pain in the above setting, most likely diagnosis is musculoskeletal strain. Other diagnoses were considered including (but not limited to) underlying fracture, epidural hematoma, cauda equina syndrome, spinal stenosis, spinal malignancy. These are considered less likely due to history of present illness and physical exam findings.   In particular, lack of fever, substantial history of IV drug use, or substantial neurologic abnormality is less consistent with epidural abscess versus discitis or other spinal infection. In particular,  Initial Plan:  Multimodal pain control described and patient informed on safe usage.  Screening evaluation including below radiographic evaluation reviewed and grossly unremarkable at this time. Patient stable for continued outpatient evaluation and management of their musculoskeletal pains.  Patient referred back to primary care provider for continued evaluation and management.   Initial Study Results:   Radiology  CT CERVICAL SPINE WO CONTRAST  Final Result       Disposition:   Based on the above findings, I believe patient is stable for discharge.    Patient and family educated about specific return precautions for given chief complaint and symptoms.  Patient and family educated about follow-up with PCP and spine center.  Patient and family expressed understanding of return precautions and need for follow-up. Patient spoken to regarding all imaging and laboratory results and appropriate follow up for these results. All education provided in verbal and written form and time was allowed for answering of patient questions. Patient discharged.          Emergency Department Medication Summary:   Medications  acetaminophen  (TYLENOL ) tablet 1,000 mg (1,000 mg Oral Given 09/20/23 0157)  ketorolac  (TORADOL ) 15 MG/ML injection 15 mg (15 mg Intramuscular Given 09/20/23 0158)  oxyCODONE   (Oxy IR/ROXICODONE ) immediate release tablet 10 mg (10 mg Oral Given 09/20/23 0158)      Clinical Impression:  1. Neck pain      Discharge   Final Clinical Impression(s) / ED Diagnoses Final diagnoses:  Neck pain    Rx / DC Orders ED Discharge Orders          Ordered    celecoxib  (CELEBREX ) 200 MG capsule  2 times daily        09/20/23 0315    acetaminophen  (TYLENOL ) 500 MG tablet  Every 8 hours PRN        09/20/23 0315    oxyCODONE  (ROXICODONE ) 5 MG immediate release tablet  Every 6 hours PRN        09/20/23 0315              Onetha Bile, MD 09/20/23 [id number]

## 2023-09-20 NOTE — ED Triage Notes (Signed)
 Pt POV reporting L side neck pain that radiates up to head and down arm, began this morning. Denies injury/trauma. Took pain meds with no relief.

## 2023-09-20 NOTE — ED Notes (Addendum)
 Oscar Myers

## 2023-09-23 ENCOUNTER — Telehealth: Payer: Self-pay | Admitting: Family Medicine

## 2023-09-23 ENCOUNTER — Encounter: Payer: Self-pay | Admitting: Medical

## 2023-09-23 ENCOUNTER — Telehealth: Payer: Self-pay | Admitting: *Deleted

## 2023-09-23 ENCOUNTER — Ambulatory Visit (INDEPENDENT_AMBULATORY_CARE_PROVIDER_SITE_OTHER): Admitting: Medical

## 2023-09-23 VITALS — BP 160/80 | HR 115 | Temp 98.2°F | Wt 188.6 lb

## 2023-09-23 DIAGNOSIS — G8929 Other chronic pain: Secondary | ICD-10-CM

## 2023-09-23 DIAGNOSIS — M5412 Radiculopathy, cervical region: Secondary | ICD-10-CM | POA: Diagnosis not present

## 2023-09-23 DIAGNOSIS — R030 Elevated blood-pressure reading, without diagnosis of hypertension: Secondary | ICD-10-CM

## 2023-09-23 DIAGNOSIS — M542 Cervicalgia: Secondary | ICD-10-CM | POA: Diagnosis not present

## 2023-09-23 MED ORDER — CELECOXIB 200 MG PO CAPS: 200.0000 mg | ORAL_CAPSULE | Freq: Two times a day (BID) | ORAL | 0 refills | Status: DC

## 2023-09-23 MED ORDER — OXYCODONE HCL 5 MG PO TABS: 5.0000 mg | ORAL_TABLET | Freq: Four times a day (QID) | ORAL | 0 refills | Status: DC | PRN

## 2023-09-23 MED ORDER — GABAPENTIN 300 MG PO CAPS: 300.0000 mg | ORAL_CAPSULE | Freq: Every day | ORAL | 0 refills | Status: DC

## 2023-09-23 MED ORDER — ACETAMINOPHEN 500 MG PO TABS: 500.0000 mg | ORAL_TABLET | Freq: Three times a day (TID) | ORAL | 0 refills | Status: DC | PRN

## 2023-09-23 NOTE — Telephone Encounter (Signed)
 Copied from CRM [id number]. Topic: General - Other >> Sep 23, 2023  2:17 PM Baldomero Bone wrote: Reason for CRM: Patient is returning a call from [place]. Patient advised that the dates on the leave of absence paperwork have already been corrected. Callback number is [phone]

## 2023-09-23 NOTE — Progress Notes (Signed)
 Subjective:  Oscar Myers is a 68 y.o. male who presents for Chief Complaint  Patient presents with   Acute Visit    Sharp shooting pain from neck to left arm down to elbow and headache on going but got worse on Sunday. Went to the ED on Sunday. Did a CT scan bulging disk.      Here with wife.  Here for neck pain, shooting pain down arms.  History includes history of chronic neck pain.  He notes history of chronic numbness/tingling.  However he did flareup recently last week for the same.  Had an updated CT scan that was abnormal.  He was given several medications which she is using including Celebrex , Tylenol  and oxycodone .  He made a self made appointment to neurosurgery for next week.  Last PT 4 years ago.  No other aggravating or relieving factors.    No other c/o.  Past Medical History:  Diagnosis Date   CTS (carpal tunnel syndrome)    Diabetes mellitus without complication (HCC)    Dyslipidemia    Peripheral vascular disease (HCC)    Smoker    Current Outpatient Medications on File Prior to Visit  Medication Sig Dispense Refill   aspirin EC 81 MG tablet Take 81 mg by mouth daily. Swallow whole.     metFORMIN (GLUCOPHAGE) 500 MG tablet Take by mouth 2 (two) times daily with a meal.     rosuvastatin  (CRESTOR ) 40 MG tablet Take 40 mg by mouth daily.     Varenicline  Tartrate, Starter, (CHANTIX  STARTING MONTH PAK) 0.5 MG X 11 & 1 MG X 42 TBPK Take one 0.5 mg tablet by mouth once daily for 3 days, then increase to one 0.5 mg tablet twice daily for 4 days, then increase to one 1 mg tablet twice daily. 53 each 0   Vitamin D , Ergocalciferol , (DRISDOL ) 1.25 MG (50000 UNIT) CAPS capsule Take 1 capsule (50,000 Units total) by mouth every 7 (seven) days. (Patient not taking: Reported on 09/23/2023) 12 capsule 0   No current facility-administered medications on file prior to visit.     The following portions of the patient's history were reviewed and updated as appropriate: allergies,  current medications, past family history, past medical history, past social history, past surgical history and problem list.  ROS Otherwise as in subjective above    Objective: BP (!) 160/80   Pulse (!) 115   Temp 98.2 F (36.8 C)   Wt 188 lb 9.6 oz (85.5 kg)   SpO2 93%   BMI 27.06 kg/m   BP Readings from Last 3 Encounters:  09/23/23 (!) 160/80  09/20/23 130/82  05/03/23 112/72   Wt Readings from Last 3 Encounters:  09/23/23 188 lb 9.6 oz (85.5 kg)  09/20/23 185 lb (83.9 kg)  05/03/23 188 lb 9.6 oz (85.5 kg)    General appearance: alert, no distress, well developed, well nourished Left lateral neck and left upper back with tenderness, somewhat decreased range of motion in general Left arm nontender in general Pulses normal    CT cervical spine wo contrast 09/20/23: IMPRESSION: 1. No acute fracture or malalignment of the cervical spine. 2. 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 3. Moderate central canal stenosis at C6-7 and C7-T1 with flattening of the thecal sac with an AP diameter of the spinal canal of 6-7 mm. 4. Moderate to severe bilateral neuro foraminal narrowing throughout the cervical spine, most severe on the right at C3-4, C4-5,  and C7-T1 and on the left at C2-3, C3-4, C4-5 and C6-7.    Assessment: Encounter Diagnoses  Name Primary?   Chronic neck pain Yes   Cervical radiculitis    Elevated blood pressure reading without diagnosis of hypertension      Plan: We discussed symptoms and concerns.  I reviewed his CT scan from 09/20/2023.  He has neurosurgery consult next week.  In the meantime we will continue with the regimen below, but we will add gabapentin  Recommendations: Begin gabapentin 300 mg daily at bedtime.  This can be increased to twice a day over the next week if it is not too sedating Over the next week until you see neurosurgery, continue the medicines prescribed by the  emergency department but stagger them for better pain control Use Celebrex  twice daily Use the Tylenol  500 mg 1 tablet up to 3 times a day or every 8 hours, but space this out from the other pain medicines by at least 2 or 3 hours The oxycodone  is a stronger pain medicine to be used no more than 3 times a day and as needed for worse pain  So for example, you could use Tylenol  first in the morning, then 3 hours later 1 Celebrex , then 3 hours later 1 oxycodone , and do a similar regimen for the afternoon   Oscar Myers was seen today for acute visit.  Diagnoses and all orders for this visit:  Chronic neck pain  Cervical radiculitis  Elevated blood pressure reading without diagnosis of hypertension  Other orders -     oxyCODONE  (ROXICODONE ) 5 MG immediate release tablet; Take 1 tablet (5 mg total) by mouth every 6 (six) hours as needed for severe pain (pain score 7-10). -     celecoxib  (CELEBREX ) 200 MG capsule; Take 1 capsule (200 mg total) by mouth 2 (two) times daily. -     acetaminophen  (TYLENOL ) 500 MG tablet; Take 1 tablet (500 mg total) by mouth every 8 (eight) hours as needed for mild pain (pain score 1-3) or moderate pain (pain score 4-6). -     gabapentin (NEURONTIN) 300 MG capsule; Take 1 capsule (300 mg total) by mouth at bedtime.    Follow up: with neurosurgery this week as planned

## 2023-09-23 NOTE — Telephone Encounter (Signed)
 Copied from CRM [id number]. Topic: General - Other >> Sep 23, 2023 12:41 PM Lotus Round B wrote: Reason for CRM: pt called in because he just seen Dr.Tysinger today 09/23/2023 and got his work note with the wrong dates . If there is anyway that Dr.Tysinger or nurse can give the pt a call back to get the correct date and when he can pick up the note to give to his job .   Called and left message for to please call back to confirm which dates are needed on note.

## 2023-09-23 NOTE — Patient Instructions (Signed)
 Recommendations: Begin gabapentin 300 mg daily at bedtime.  This can be increased to twice a day over the next week if it is not too sedating Over the next week until you see neurosurgery, continue the medicines prescribed by the emergency department but stagger them for better pain control Use Celebrex  twice daily Use the Tylenol  500 mg 1 tablet up to 3 times a day or every 8 hours, but space this out from the other pain medicines by at least 2 or 3 hours The oxycodone  is a stronger pain medicine to be used no more than 3 times a day and as needed for worse pain  So for example, you could use Tylenol  first in the morning, then 3 hours later 1 Celebrex , then 3 hours later 1 oxycodone , and do a similar regimen for the afternoon  Follow-up as planned with the neurosurgery office

## 2023-09-30 DIAGNOSIS — M5412 Radiculopathy, cervical region: Secondary | ICD-10-CM | POA: Diagnosis not present

## 2023-10-14 LAB — LAB REPORT - SCANNED
A1c: 7.2
Albumin, Urine POC: 1.1
Creatinine, POC: 263.1 mg/dL
EGFR: 91
Microalb Creat Ratio: 4.2
TSH: 2.13 (ref 0.41–5.90)

## 2023-10-19 DIAGNOSIS — M5412 Radiculopathy, cervical region: Secondary | ICD-10-CM | POA: Diagnosis not present

## 2023-10-19 DIAGNOSIS — M4802 Spinal stenosis, cervical region: Secondary | ICD-10-CM | POA: Diagnosis not present

## 2023-10-19 DIAGNOSIS — M47812 Spondylosis without myelopathy or radiculopathy, cervical region: Secondary | ICD-10-CM | POA: Diagnosis not present

## 2023-10-19 DIAGNOSIS — M542 Cervicalgia: Secondary | ICD-10-CM | POA: Diagnosis not present

## 2023-10-19 DIAGNOSIS — M4803 Spinal stenosis, cervicothoracic region: Secondary | ICD-10-CM | POA: Diagnosis not present

## 2023-10-19 NOTE — Progress Notes (Signed)
 Chief Complaint  Patient presents with   Tachycardia    Feels like his heart races, like he is nervous. Started about 3 months ago, could be 3-4 times a month. Very short lived 30-45 seconds and no other symptoms. Comes and then goes. Just had lipids, CMET, TSH, cbc and A1C at [facility] 10/14/23. Finished RX D, not taking any OTC. Took Chantix  starter pack, still smoking. [facility].    Heart races every now and then, sometimes multiple times/day, sometimes just once a week.  This has occurred for a long time, but more frequent in the last 3 months.   He reports the pulse is very fast, and too short-lived to count. Lasts about 30 seconds.  He feels slightly lightheaded, and quickly resolves. There can be some chest discomfort for that short period as well.  This last occurred on 6/15--noticed slightly LH, pressure below the L breast area and in the back.  LH lasted 60-90s; chest pressure lasted a couple of hours.   He thought it could have been gas, he had some belching. Not aware of any triggers for the symptoms (OTC medications, caffeine beyond his normal amount, dehydration).  He drinks a LOT of Mountain Dew--5 of the 16 oz bottles every day.  Regular, not diet.  He has been doing this for 20 years or more.  He has heartburn 1-2x/week. He was given a medication by the [place], no longer currently taking. Noted heartburn some of the times with the heart racing, but not all of the times.  Vitamin D  deficiency: level of 13.4 in 04/2023 Rx'd 12 weeks of prescription vitamin D . He has not been taking any OTC D3 since completing the Rx  Chronic neck pain, flared last month. Saw neurosurgeon, MRI recommended (done earlier this week, hasn't gotten results).  Smoker--He reports he completed the starter pack of chantix --got close to quitting.  He was smoking much less.  He wasn't given any refills/continuation packs). He denied having any side effects. He was prescribed wellbutrin  and nicotine patches recently from the [place] for smoking cessation--hasn't come yet.   He is interested in re-trying the Chantix , rather than these meds.  Lab Results  Component Value Date   HGBA1C 6.7 (H) 05/03/2023   Lab Results  Component Value Date   WBC 4.3 05/03/2023   HGB 12.7 (L) 05/03/2023   HCT 38.9 05/03/2023   MCV 89 05/03/2023   PLT 147 (L) 05/03/2023    PMH, PSH, SH reviewed  DM, HLD Vitamin D  deficiency Followed by [facility]  Labs brought in by patient today, from 10/14/23: A1c 7.2 Fasting glucose 124, rest of c-met normal. TC 123, HDL 38.5, LDL 68, TG 124 Urine microalb ratio 4.2 CBC: WBC 3.85, Hgb 13.5, HCT 39.5, plt 115) TSH 2.134   Outpatient Encounter Medications as of 10/20/2023  Medication Sig   aspirin EC 81 MG tablet Take 81 mg by mouth daily. Swallow whole.   celecoxib  (CELEBREX ) 200 MG capsule Take 1 capsule (200 mg total) by mouth 2 (two) times daily.   gabapentin  (NEURONTIN ) 300 MG capsule Take 1 capsule (300 mg total) by mouth at bedtime.   metFORMIN (GLUCOPHAGE) 500 MG tablet Take by mouth 2 (two) times daily with a meal.   oxyCODONE  (ROXICODONE ) 5 MG immediate release tablet Take 1 tablet (5 mg total) by mouth every 6 (six) hours as needed for severe pain (pain score 7-10).   rosuvastatin  (CRESTOR ) 40 MG tablet Take 40 mg by mouth  daily.   [DISCONTINUED] acetaminophen  (TYLENOL ) 500 MG tablet Take 1 tablet (500 mg total) by mouth every 8 (eight) hours as needed for mild pain (pain score 1-3) or moderate pain (pain score 4-6).   Varenicline  Tartrate, Starter, (CHANTIX  STARTING MONTH PAK) 0.5 MG X 11 & 1 MG X 42 TBPK Take one 0.5 mg tablet by mouth once daily for 3 days, then increase to one 0.5 mg tablet twice daily for 4 days, then increase to one 1 mg tablet twice daily. (Patient not taking: Reported on 10/20/2023)   [DISCONTINUED] Vitamin D , Ergocalciferol , (DRISDOL ) 1.25 MG (50000 UNIT) CAPS capsule Take 1 capsule (50,000 Units total) by mouth every 7  (seven) days. (Patient not taking: Reported on 09/23/2023)   No facility-administered encounter medications on file as of 10/20/2023.   No Known Allergies  ROS: no f/c, URI symptoms, headaches, n/v, bowel changes. Some heartburn per HPI. Palpitations, with some associated LH and chest pressure per HPI. No bleeding, bruising, rash. No depression/anxiety/insomnia See HPI    PHYSICAL EXAM:  BP 130/80   Pulse 80   Ht 5' 9 (1.753 m)   Wt 191 lb 12.8 oz (87 kg)   BMI 28.32 kg/m   Pleasant, well-appearing male, in good spirits HEENT: conjunctiva and sclera are clear, anicteric. Sinuses nontender, OP clear Neck: no lymphadenopathy, thyromegaly, mass or bruit Heart: regular rate and rhythm. No murmur, rub, gallop or ectopy Lungs: clear bilaterally Back: no spinal or CVA tenderness Abdomen: soft, nontender, no mass Extremities: no edema Psych: normal mood, affect, hygiene and grooming Neuro: alert and oriented, cranial nerves grossly intact. Normal strength, gait  EKG--normal   ASSESSMENT/PLAN:  Palpitations - ddx reviewed. short-lived, but some assoc sx. Cut back on caffeine, hydrate, measure pulse and keep journal. May need zio monitor - Plan: EKG 12-Lead  Thrombocytopenia (HCC) - a little lower on recent labs compared to December.  No further eval, other than monitoring  Type 2 diabetes mellitus with hyperglycemia, without long-term current use of insulin (HCC) - A1c above goal. Discussed cutting back on sugar in diet (ie soda), exercise, wt loss. Cont current meds and f/u with PCP  Smoker - restart Chantix ; risks/SE reviewed. To set quit date after 2 weeks. Complete all 3 mos of meds - Plan: Varenicline  Tartrate, Starter, (CHANTIX  STARTING MONTH PAK) 0.5 MG X 11 & 1 MG X 42 TBPK, varenicline  (CHANTIX  CONTINUING MONTH PAK) 1 MG tablet  Vitamin D  deficiency - advised to start a daily supplement.  To recheck w/next labs through PCP  F/u with PCP in December for CPE, sooner prn  if persistent/worsening palpitations, elevated sugars, or other concerns.  I spent 45 minutes dedicated to the care of this patient, including pre-visit review of records, face to face time, post-visit ordering of testing and documentation.  Please cut back on your [facility]. This has a lot of sugar (and your recent A1c was over 7, above goal). It also has a lot of caffeine, which can sometimes trigger palpitations. It also dehydrating to your body. Try and slowly taper down and increase your water intake.  Try and wear your AppleWatch in order to be able to check your pulse when it is racing.  Seeing how high the pulse is can help us  determine the underlying cause (less worrisome if it is 120's, versus if it is 160's or higher). Keep a journal of your symptoms, how high it goes, how you feel (dizzy, chest pain), how long it lasted, and try and figure  if there were any potential triggers (caffeine/dehydration, decongestant use, etc).  Let us  know if your symptoms persist or worsen, especially if lasting longer, or more associated symptoms (chest pain, shortness of breath). We may need to evaluate you with a heart monitor. For now, let's try and reduce triggers, and caffeine is the largest one, as well as quitting smoking.   Let's plan to re-try Chantix  for the full 3 month course, since you tolerated the first month well, and saw some benefit. You will take another starter pack, followed by a continuation pack (that is a separate prescription which will have a refill). I encourage you to set a quit date for 2 weeks after being on the medication.  If this isn't effective, feel free to try the Wellbutrin and nicotine patch route.  I suspect that your vitamin D  level will still be low, since you haven't taken any supplements since finishing the prescription in March. I recommend taking 1000-2000 IU of vitamin D3 every day.  This can be rechecked at your next physical.

## 2023-10-20 ENCOUNTER — Encounter: Payer: Self-pay | Admitting: Family Medicine

## 2023-10-20 ENCOUNTER — Ambulatory Visit: Admitting: Family Medicine

## 2023-10-20 VITALS — BP 130/80 | HR 80 | Ht 69.0 in | Wt 191.8 lb

## 2023-10-20 DIAGNOSIS — E559 Vitamin D deficiency, unspecified: Secondary | ICD-10-CM | POA: Diagnosis not present

## 2023-10-20 DIAGNOSIS — F17200 Nicotine dependence, unspecified, uncomplicated: Secondary | ICD-10-CM | POA: Diagnosis not present

## 2023-10-20 DIAGNOSIS — E1165 Type 2 diabetes mellitus with hyperglycemia: Secondary | ICD-10-CM | POA: Diagnosis not present

## 2023-10-20 DIAGNOSIS — R002 Palpitations: Secondary | ICD-10-CM

## 2023-10-20 DIAGNOSIS — D696 Thrombocytopenia, unspecified: Secondary | ICD-10-CM | POA: Diagnosis not present

## 2023-10-20 DIAGNOSIS — Z5181 Encounter for therapeutic drug level monitoring: Secondary | ICD-10-CM

## 2023-10-20 MED ORDER — VARENICLINE TARTRATE 1 MG PO TABS: 1.0000 mg | ORAL_TABLET | Freq: Two times a day (BID) | ORAL | 1 refills | Status: DC

## 2023-10-20 MED ORDER — VARENICLINE TARTRATE (STARTER) 0.5 MG X 11 & 1 MG X 42 PO TBPK: ORAL_TABLET | ORAL | 0 refills | Status: DC

## 2023-10-20 NOTE — Patient Instructions (Signed)
 Please cut back on your [facility]. This has a lot of sugar (and your recent A1c was over 7, above goal). It also has a lot of caffeine, which can sometimes trigger palpitations. It also dehydrating to your body. Try and slowly taper down and increase your water intake.  Try and wear your AppleWatch in order to be able to check your pulse when it is racing.  Seeing how high the pulse is can help us  determine the underlying cause (less worrisome if it is 120's, versus if it is 160's or higher). Keep a journal of your symptoms, how high it goes, how you feel (dizzy, chest pain), how long it lasted, and try and figure if there were any potential triggers (caffeine/dehydration, decongestant use, etc).  Let us  know if your symptoms persist or worsen, especially if lasting longer, or more associated symptoms (chest pain, shortness of breath). We may need to evaluate you with a heart monitor. For now, let's try and reduce triggers, and caffeine is the largest one, as well as quitting smoking.   Let's plan to re-try Chantix  for the full 3 month course, since you tolerated the first month well, and saw some benefit. You will take another starter pack, followed by a continuation pack (that is a separate prescription which will have a refill). I encourage you to set a quit date for 2 weeks after being on the medication.  If this isn't effective, feel free to try the Wellbutrin and nicotine patch route.  I suspect that your vitamin D  level will still be low, since you haven't taken any supplements since finishing the prescription in March. I recommend taking 1000-2000 IU of vitamin D3 every day.  This can be rechecked at your next physical.

## 2023-11-16 DIAGNOSIS — M5412 Radiculopathy, cervical region: Secondary | ICD-10-CM | POA: Diagnosis not present

## 2023-11-22 ENCOUNTER — Other Ambulatory Visit: Payer: Self-pay | Admitting: Neurological Surgery

## 2023-12-06 NOTE — Progress Notes (Signed)
 Surgical Instructions   Your procedure is scheduled on Monday December 13, 2023. Report to [facility] Main Entrance A at 7:45 A.M., then check in with the Admitting office. Any questions or running late day of surgery: call [phone]  Questions prior to your surgery date: call [phone], Monday-Friday, 8am-4pm. If you experience any cold or flu symptoms such as cough, fever, chills, shortness of breath, etc. between now and your scheduled surgery, please notify us  at the above number.     Remember:  Do not eat or drink after midnight the night before your surgery  Take these medicines the morning of surgery with A SIP OF WATER  varenicline  (CHANTIX )   May take these medicines IF NEEDED: gabapentin  (NEURONTIN )  oxyCODONE  (ROXICODONE )    Follow your surgeon's instructions on when to stop Asprin.  If no instructions were given by your surgeon then you will need to call the office to get those instructions.     One week prior to surgery, STOP taking any Aleve, Naproxen, Ibuprofen, Motrin, Advil, Goody's, BC's, all herbal medications, fish oil, and non-prescription vitamins. This includes your celecoxib  (CELEBREX ).   WHAT DO I DO ABOUT MY DIABETES MEDICATION?   Do not take oral diabetes medicines (pills) the morning of surgery.        DO NOT TAKE YOUR metFORMIN (GLUCOPHAGE) THE MORNING OF SURGERY.    The day of surgery, do not take other diabetes injectables, including Byetta (exenatide), Bydureon (exenatide ER), Victoza (liraglutide), or Trulicity (dulaglutide).  If your CBG is greater than 220 mg/dL, you may take  of your sliding scale (correction) dose of insulin.   HOW TO MANAGE YOUR DIABETES BEFORE AND AFTER SURGERY  Why is it important to control my blood sugar before and after surgery? Improving blood sugar levels before and after surgery helps healing and can limit problems. A way of improving blood sugar control is eating a healthy diet by:  Eating less sugar  and carbohydrates  Increasing activity/exercise  Talking with your doctor about reaching your blood sugar goals High blood sugars (greater than 180 mg/dL) can raise your risk of infections and slow your recovery, so you will need to focus on controlling your diabetes during the weeks before surgery. Make sure that the doctor who takes care of your diabetes knows about your planned surgery including the date and location.  How do I manage my blood sugar before surgery? Check your blood sugar at least 4 times a day, starting 2 days before surgery, to make sure that the level is not too high or low.  Check your blood sugar the morning of your surgery when you wake up and every 2 hours until you get to the Short Stay unit.  If your blood sugar is less than 70 mg/dL, you will need to treat for low blood sugar: Do not take insulin. Treat a low blood sugar (less than 70 mg/dL) with  cup of clear juice (cranberry or apple), 4 glucose tablets, OR glucose gel. Recheck blood sugar in 15 minutes after treatment (to make sure it is greater than 70 mg/dL). If your blood sugar is not greater than 70 mg/dL on recheck, call [phone] for further instructions. Report your blood sugar to the short stay nurse when you get to Short Stay.  If you are admitted to the hospital after surgery: Your blood sugar will be checked by the staff and you will probably be given insulin after surgery (instead of oral diabetes medicines) to make sure you  have good blood sugar levels. The goal for blood sugar control after surgery is 80-180 mg/dL.                       Do NOT Smoke (Tobacco/Vaping) for 24 hours prior to your procedure.  If you use a CPAP at night, you may bring your mask/headgear for your overnight stay.   You will be asked to remove any contacts, glasses, piercing's, hearing aid's, dentures/partials prior to surgery. Please bring cases for these items if needed.    Patients discharged the day of surgery  will not be allowed to drive home, and someone needs to stay with them for 24 hours.  SURGICAL WAITING ROOM VISITATION Patients may have no more than 2 support people in the waiting area - these visitors may rotate.   Pre-op nurse will coordinate an appropriate time for 1 ADULT support person, who may not rotate, to accompany patient in pre-op.  Children under the age of 75 must have an adult with them who is not the patient and must remain in the main waiting area with an adult.  If the patient needs to stay at the hospital during part of their recovery, the visitor guidelines for inpatient rooms apply.  Please refer to the [facility] website for the visitor guidelines for any additional information.   If you received a COVID test during your pre-op visit  it is requested that you wear a mask when out in public, stay away from anyone that may not be feeling well and notify your surgeon if you develop symptoms. If you have been in contact with anyone that has tested positive in the last 10 days please notify you surgeon.      Pre-operative 5 CHG Bathing Instructions   You can play a key role in reducing the risk of infection after surgery. Your skin needs to be as free of germs as possible. You can reduce the number of germs on your skin by washing with CHG (chlorhexidine  gluconate) soap before surgery. CHG is an antiseptic soap that kills germs and continues to kill germs even after washing.   DO NOT use if you have an allergy to chlorhexidine /CHG or antibacterial soaps. If your skin becomes reddened or irritated, stop using the CHG and notify one of our RNs at [phone].   Please shower with the CHG soap starting 4 days before surgery using the following schedule:     Please keep in mind the following:  You may shave your face at any point before/day of surgery.  Place clean sheets on your bed the day you start using CHG soap. Use a clean washcloth (not used since being washed)  for each shower. DO NOT sleep with pets once you start using the CHG.   CHG Shower Instructions:  Wash your face and private area with normal soap. If you choose to wash your hair, wash first with your normal shampoo.  After you use shampoo/soap, rinse your hair and body thoroughly to remove shampoo/soap residue.  Turn the water OFF and apply about 3 tablespoons (45 ml) of CHG soap to a CLEAN washcloth.  Apply CHG soap ONLY FROM YOUR NECK DOWN TO YOUR TOES (washing for 3-5 minutes)  DO NOT use CHG soap on face, private areas, open wounds, or sores.  Pay special attention to the area where your surgery is being performed.  If you are having back surgery, having someone wash your back for you may be  helpful. Wait 2 minutes after CHG soap is applied, then you may rinse off the CHG soap.  Pat dry with a clean towel  Put on clean clothes/pajamas   If you choose to wear lotion, please use ONLY the CHG-compatible lotions that are listed below.  Additional instructions for the day of surgery: DO NOT APPLY any lotions, deodorants or cologne.   Do not bring valuables to the hospital. [facility] is not responsible for any belongings/valuables. Do not wear nail polish, gel polish, artificial nails, or any other type of covering on natural nails (fingers and toes) Do not wear jewelry  Put on clean/comfortable clothes.  Please brush your teeth.  Ask your nurse before applying any prescription medications to the skin.     CHG Compatible Lotions   Aveeno Moisturizing lotion  Cetaphil Moisturizing Cream  Cetaphil Moisturizing Lotion  Clairol Herbal Essence Moisturizing Lotion, Dry Skin  Clairol Herbal Essence Moisturizing Lotion, Extra Dry Skin  Clairol Herbal Essence Moisturizing Lotion, Normal Skin  Curel Age Defying Therapeutic Moisturizing Lotion with Alpha Hydroxy  Curel Extreme Care Body Lotion  Curel Soothing Hands Moisturizing Hand Lotion  Curel Therapeutic Moisturizing Cream,  Fragrance-Free  Curel Therapeutic Moisturizing Lotion, Fragrance-Free  Curel Therapeutic Moisturizing Lotion, Original Formula  Eucerin Daily Replenishing Lotion  Eucerin Dry Skin Therapy Plus Alpha Hydroxy Crme  Eucerin Dry Skin Therapy Plus Alpha Hydroxy Lotion  Eucerin Original Crme  Eucerin Original Lotion  Eucerin Plus Crme Eucerin Plus Lotion  Eucerin TriLipid Replenishing Lotion  Keri Anti-Bacterial Hand Lotion  Keri Deep Conditioning Original Lotion Dry Skin Formula Softly Scented  Keri Deep Conditioning Original Lotion, Fragrance Free Sensitive Skin Formula  Keri Lotion Fast Absorbing Fragrance Free Sensitive Skin Formula  Keri Lotion Fast Absorbing Softly Scented Dry Skin Formula  Keri Original Lotion  Keri Skin Renewal Lotion Keri Silky Smooth Lotion  Keri Silky Smooth Sensitive Skin Lotion  Nivea Body Creamy Conditioning Oil  Nivea Body Extra Enriched Lotion  Nivea Body Original Lotion  Nivea Body Sheer Moisturizing Lotion Nivea Crme  Nivea Skin Firming Lotion  NutraDerm 30 Skin Lotion  NutraDerm Skin Lotion  NutraDerm Therapeutic Skin Cream  NutraDerm Therapeutic Skin Lotion  ProShield Protective Hand Cream  Provon moisturizing lotion  Please read over the following fact sheets that you were given.

## 2023-12-07 ENCOUNTER — Encounter (HOSPITAL_COMMUNITY): Payer: Self-pay

## 2023-12-07 ENCOUNTER — Encounter (HOSPITAL_COMMUNITY)
Admission: RE | Admit: 2023-12-07 | Discharge: 2023-12-07 | Disposition: A | Discharge status: Home / Self Care | Source: Ambulatory Visit | Attending: Neurological Surgery | Admitting: Neurological Surgery

## 2023-12-07 ENCOUNTER — Other Ambulatory Visit: Payer: Self-pay

## 2023-12-07 VITALS — BP 169/97 | HR 98 | Temp 98.2°F | Resp 17 | Ht 70.0 in | Wt 188.3 lb

## 2023-12-07 DIAGNOSIS — M4802 Spinal stenosis, cervical region: Secondary | ICD-10-CM | POA: Insufficient documentation

## 2023-12-07 DIAGNOSIS — E785 Hyperlipidemia, unspecified: Secondary | ICD-10-CM | POA: Insufficient documentation

## 2023-12-07 DIAGNOSIS — J439 Emphysema, unspecified: Secondary | ICD-10-CM | POA: Insufficient documentation

## 2023-12-07 DIAGNOSIS — I700 Atherosclerosis of aorta: Secondary | ICD-10-CM | POA: Insufficient documentation

## 2023-12-07 DIAGNOSIS — Z87891 Personal history of nicotine dependence: Secondary | ICD-10-CM | POA: Insufficient documentation

## 2023-12-07 DIAGNOSIS — E1151 Type 2 diabetes mellitus with diabetic peripheral angiopathy without gangrene: Secondary | ICD-10-CM | POA: Insufficient documentation

## 2023-12-07 DIAGNOSIS — M5010 Cervical disc disorder with radiculopathy, unspecified cervical region: Secondary | ICD-10-CM | POA: Diagnosis not present

## 2023-12-07 DIAGNOSIS — Z01818 Encounter for other preprocedural examination: Secondary | ICD-10-CM

## 2023-12-07 DIAGNOSIS — Z01812 Encounter for preprocedural laboratory examination: Secondary | ICD-10-CM | POA: Insufficient documentation

## 2023-12-07 HISTORY — DX: Unspecified hearing loss, unspecified ear: H91.90

## 2023-12-07 LAB — BASIC METABOLIC PANEL WITH GFR
Anion gap: 10 (ref 5–15)
BUN: 10 mg/dL (ref 8–23)
CO2: 26 mmol/L (ref 22–32)
Calcium: 10.9 mg/dL — ABNORMAL HIGH (ref 8.9–10.3)
Chloride: 102 mmol/L (ref 98–111)
Creatinine, Ser: 0.87 mg/dL (ref 0.61–1.24)
GFR, Estimated: >60 mL/min (ref >60)
Glucose, Bld: 97 mg/dL (ref 70–99)
Potassium: 3.9 mmol/L (ref 3.5–5.1)
Sodium: 138 mmol/L (ref 135–145)

## 2023-12-07 LAB — MICROALBUMIN / CREATININE URINE RATIO: Albumin/Creatinine Ratio, Urine, POC: <30

## 2023-12-07 LAB — CBC
HCT: 44 % (ref 39.0–52.0)
Hemoglobin: 14.2 g/dL (ref 13.0–17.0)
MCH: 29.9 pg (ref 26.0–34.0)
MCHC: 32.3 g/dL (ref 30.0–36.0)
MCV: 92.6 fL (ref 80.0–100.0)
Platelets: 147 K/uL — ABNORMAL LOW (ref 150–400)
RBC: 4.75 MIL/uL (ref 4.22–5.81)
RDW: 12.8 % (ref 11.5–15.5)
WBC: 5.4 K/uL (ref 4.0–10.5)
nRBC: 0 % (ref 0.0–0.2)

## 2023-12-07 LAB — TYPE AND SCREEN
ABO/RH(D): O POS
Antibody Screen: NEGATIVE

## 2023-12-07 LAB — SURGICAL PCR SCREEN
MRSA, PCR: NEGATIVE
Staphylococcus aureus: NEGATIVE

## 2023-12-07 LAB — GLUCOSE, CAPILLARY: Glucose-Capillary: 128 mg/dL — ABNORMAL HIGH (ref 70–99)

## 2023-12-07 NOTE — Progress Notes (Addendum)
 PCP - Ludie Gent, PA Cardiologist - none  CT Chest x-ray - 05/20/23 EKG - 10/21/23 Stress Test - 2009 ECHO - 10/04/06 Cardiac Cath - n/a  ICD Pacemaker/Loop - n/a  Sleep Study -  n/a  Diabetes Type 2 Do not take Metforman the morning of surgery.  Blood Thinner Instructions:  n/a  Aspirin Instructions: Follow your surgeon's instructions on when to stop aspirin prior to surgery,  If no instructions were given by your surgeon then you will need to call the office for those instructions.  Stop Chantix  2 days prior to surgery. Last dose will be on 12/10/23.   NPO   Anesthesia review: Yes  STOP now taking any Aspirin (unless otherwise instructed by your surgeon), Aleve, Celebrex , Naproxen, Ibuprofen, Motrin, Advil, Goody's, BC's, all herbal medications, fish oil, and all vitamins.   Coronavirus Screening Do you have any of the following symptoms:  Cough yes/no: No Fever (>100.41F)  yes/no: No Runny nose yes/no: No Sore throat yes/no: No Difficulty breathing/shortness of breath  yes/no: No  Have you traveled in the last 14 days and where? yes/no: No  Patient verbalized understanding of instructions that were given to them at the PAT appointment. Patient was also instructed that they will need to review over the PAT instructions again at home before surgery.

## 2023-12-07 NOTE — Progress Notes (Signed)
 Surgical Instructions   Your procedure is scheduled on Monday December 13, 2023. Report to [facility] Main Entrance A at 7:45 A.M., then check in with the Admitting office. Any questions or running late day of surgery: call [phone]  Questions prior to your surgery date: call [phone], Monday-Friday, 8am-4pm. If you experience any cold or flu symptoms such as cough, fever, chills, shortness of breath, etc. between now and your scheduled surgery, please notify us  at the above number.     Remember:  Do not eat or drink after midnight the night before your surgery  Take these medicines the morning of surgery with A SIP OF WATER  varenicline  (CHANTIX )   May take these medicines IF NEEDED: gabapentin  (NEURONTIN )  oxyCODONE  (ROXICODONE )    Follow your surgeon's instructions on when to stop Asprin.  If no instructions were given by your surgeon then you will need to call the office to get those instructions.     One week prior to surgery, STOP taking any Aleve, Naproxen, Ibuprofen, Motrin, Advil, Goody's, BC's, all herbal medications, fish oil, and non-prescription vitamins. This includes your celecoxib  (CELEBREX ).    Stop Chantix  2 days prior to surgery.  Last dose will be on 12/10/23.     WHAT DO I DO ABOUT MY DIABETES MEDICATION?        DO NOT TAKE YOUR METFORMIN (GLUCOPHAGE) THE MORNING OF SURGERY.    If you are admitted to the hospital after surgery: Your blood sugar will be checked by the staff and you will probably be given insulin after surgery (instead of oral diabetes medicines) to make sure you have good blood sugar levels. The goal for blood sugar control after surgery is 80-180 mg/dL.                    Do NOT Smoke (Tobacco/Vaping) for 24 hours prior to your procedure.   You will be asked to remove any contacts, glasses, piercing's, hearing aid's, dentures/partials prior to surgery. Please bring cases for these items if needed.    Patients discharged the day  of surgery will not be allowed to drive home, and someone needs to stay with them for 24 hours.  SURGICAL WAITING ROOM VISITATION Patients may have no more than 2 support people in the waiting area - these visitors may rotate.   Pre-op nurse will coordinate an appropriate time for 1 ADULT support person, who may not rotate, to accompany patient in pre-op.  Children under the age of 30 must have an adult with them who is not the patient and must remain in the main waiting area with an adult.  If the patient needs to stay at the hospital during part of their recovery, the visitor guidelines for inpatient rooms apply.  Please refer to the [facility] website for the visitor guidelines for any additional information.   If you received a COVID test during your pre-op visit  it is requested that you wear a mask when out in public, stay away from anyone that may not be feeling well and notify your surgeon if you develop symptoms. If you have been in contact with anyone that has tested positive in the last 10 days please notify you surgeon.      Pre-operative 5 CHG Bathing Instructions   You can play a key role in reducing the risk of infection after surgery. Your skin needs to be as free of germs as possible. You can reduce the number of germs on your  skin by washing with CHG (chlorhexidine  gluconate) soap before surgery. CHG is an antiseptic soap that kills germs and continues to kill germs even after washing.   DO NOT use if you have an allergy to chlorhexidine /CHG or antibacterial soaps. If your skin becomes reddened or irritated, stop using the CHG and notify one of our RNs at [phone].   Please shower with the CHG soap starting 4 days before surgery using the following schedule:     Please keep in mind the following:  You may shave your face at any point before/day of surgery.  Place clean sheets on your bed the day you start using CHG soap. Use a clean washcloth (not used since being  washed) for each shower. DO NOT sleep with pets once you start using the CHG.   CHG Shower Instructions:  Wash your face and private area with normal soap. If you choose to wash your hair, wash first with your normal shampoo.  After you use shampoo/soap, rinse your hair and body thoroughly to remove shampoo/soap residue.  Turn the water OFF and apply about 3 tablespoons (45 ml) of CHG soap to a CLEAN washcloth.  Apply CHG soap ONLY FROM YOUR NECK DOWN TO YOUR TOES (washing for 3-5 minutes)  DO NOT use CHG soap on face, private areas, open wounds, or sores.  Pay special attention to the area where your surgery is being performed.  If you are having back surgery, having someone wash your back for you may be helpful. Wait 2 minutes after CHG soap is applied, then you may rinse off the CHG soap.  Pat dry with a clean towel  Put on clean clothes/pajamas   If you choose to wear lotion, please use ONLY the CHG-compatible lotions that are listed below.  Additional instructions for the day of surgery: DO NOT APPLY any lotions, deodorants or cologne.   Do not bring valuables to the hospital. [facility] is not responsible for any belongings/valuables. Do not wear nail polish, gel polish, artificial nails, or any other type of covering on natural nails (fingers and toes) Do not wear jewelry  Put on clean/comfortable clothes.  Please brush your teeth.  Ask your nurse before applying any prescription medications to the skin.     CHG Compatible Lotions   Aveeno Moisturizing lotion  Cetaphil Moisturizing Cream  Cetaphil Moisturizing Lotion  Clairol Herbal Essence Moisturizing Lotion, Dry Skin  Clairol Herbal Essence Moisturizing Lotion, Extra Dry Skin  Clairol Herbal Essence Moisturizing Lotion, Normal Skin  Curel Age Defying Therapeutic Moisturizing Lotion with Alpha Hydroxy  Curel Extreme Care Body Lotion  Curel Soothing Hands Moisturizing Hand Lotion  Curel Therapeutic Moisturizing Cream,  Fragrance-Free  Curel Therapeutic Moisturizing Lotion, Fragrance-Free  Curel Therapeutic Moisturizing Lotion, Original Formula  Eucerin Daily Replenishing Lotion  Eucerin Dry Skin Therapy Plus Alpha Hydroxy Crme  Eucerin Dry Skin Therapy Plus Alpha Hydroxy Lotion  Eucerin Original Crme  Eucerin Original Lotion  Eucerin Plus Crme Eucerin Plus Lotion  Eucerin TriLipid Replenishing Lotion  Keri Anti-Bacterial Hand Lotion  Keri Deep Conditioning Original Lotion Dry Skin Formula Softly Scented  Keri Deep Conditioning Original Lotion, Fragrance Free Sensitive Skin Formula  Keri Lotion Fast Absorbing Fragrance Free Sensitive Skin Formula  Keri Lotion Fast Absorbing Softly Scented Dry Skin Formula  Keri Original Lotion  Keri Skin Renewal Lotion Keri Silky Smooth Lotion  Keri Silky Smooth Sensitive Skin Lotion  Nivea Body Creamy Conditioning Oil  Nivea Body Extra Enriched Counsellor  Nivea Body Sheer Moisturizing Lotion Nivea Crme  Nivea Skin Firming Lotion  NutraDerm 30 Skin Lotion  NutraDerm Skin Lotion  NutraDerm Therapeutic Skin Cream  NutraDerm Therapeutic Skin Lotion  ProShield Protective Hand Cream  Provon moisturizing lotion  Please read over the following fact sheets that you were given.

## 2023-12-08 ENCOUNTER — Encounter (HOSPITAL_COMMUNITY): Payer: Self-pay

## 2023-12-08 ENCOUNTER — Encounter (HOSPITAL_COMMUNITY): Payer: Self-pay | Admitting: Vascular Surgery

## 2023-12-08 NOTE — Progress Notes (Signed)
 Anesthesia Chart Review:  Case: [id number] Date/Time: 12/13/23 0930   Procedure: ANTERIOR CERVICAL DECOMPRESSION/DISCECTOMY FUSION 4 LEVELS - ACDF - C3-C4 - C4-C5 - C5-C6 - C6-C7   Anesthesia type: General   Diagnosis: Radiculopathy, cervical region [M54.12]   Pre-op diagnosis: Radiculopathy cervical region   [facility] ROOM [facility] / [facility]   Surgeons: Joshua Alm Hamilton, MD       DISCUSSION: Patient is a 68 year old male scheduled for the above procedure.  History includes smoking, DM2, PVD, dyslipidemia, hearing loss, spinal surgery.  Primary care visit on 10/20/23 for palpitations, more frequent in last 3 months, but lat about 30 seconds. On episode 10/17/23 that last about 60-90 seconds with some associated chest discomfort. He was belching, so though it may be gas. He admitted to drinking 5 16 oz bottles of Mountain Dew daily. He had cut back on smoking and was awaiting Nicotine patches and wanted to retry Chantix .  She advised smoking cessation, that he cut down on caffeine and monitor (and journal) symptoms. If persistent, may need Zio monitor. EKG was normal. Follow-up in December had been planned unless worsening symptoms.   Discussed with anesthesiologist Niels Pons, MD. Advised PCP preoperative evaluation. Will notify Dr. Joshua' staff.   VS: BP (!) 169/97   Pulse 98   Temp 36.8 C   Resp 17   Ht 5' 10 (1.778 m)   Wt 85.4 kg   SpO2 99%   BMI 27.02 kg/m   PROVIDERS: [name], PA-C   LABS: Preoperative labs noted. PLT 147K, stable. Ca 10.9 (10.5 05/03/23, 11.3 10/24/20). LFTs normal 05/03/23.  A1c 7.2% 10/14/23.  (all labs ordered are listed, but only abnormal results are displayed)  Labs Reviewed  GLUCOSE, CAPILLARY - Abnormal; Notable for the following components:      Result Value   Glucose-Capillary 128 (*)    All other components within normal limits  CBC - Abnormal; Notable for the following components:   Platelets 147 (*)    All other components  within normal limits  BASIC METABOLIC PANEL WITH GFR - Abnormal; Notable for the following components:   Calcium  10.9 (*)    All other components within normal limits  SURGICAL PCR SCREEN  TYPE AND SCREEN     IMAGES: CT C-spine 09/20/23: IMPRESSION: 1. No acute fracture or malalignment of the cervical spine. 2. 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 3. Moderate central canal stenosis at C6-7 and C7-T1 with flattening of the thecal sac with an AP diameter of the spinal canal of 6-7 mm. 4. Moderate to severe bilateral neuroforaminal narrowing throughout the cervical spine, most severe on the right at C3-4, C4-5, and C7-T1 and on the left at C2-3, C3-4, C4-5 and C6-7.   CT Chest LCS 05/20/23: IMPRESSION: - Lung-RADS 2, benign appearance or behavior. Continue annual screening with low-dose chest CT without contrast in 12 months. - Aortic Atherosclerosis (ICD10-I70.0) and Emphysema (ICD10-J43.9).   EKG: EKG 10/20/23: NSR   CV: Echo 10/04/06: SUMMARY   -  Overall left ventricular systolic function was at the lower limits of normal. Left ventricular ejection fraction was estimated to be 50 %. There were no left ventricular regional wall motion abnormalities. Left ventricular diastolic function parameters were normal.   -  Aortic valve thickness was mildly increased.    In June 2008: Stress Cardiolite negative for ischemia.  Low-normal ejection      fraction of 49%, but 2D echo shows normal LV  function, no wall      motion abnormality, ejection fraction 50%.   Past Medical History:  Diagnosis Date   CTS (carpal tunnel syndrome)    Diabetes mellitus without complication (HCC)    type 2   Dyslipidemia    Hearing loss    currently does not wear hearing aids   Peripheral vascular disease (HCC)    Smoker     Past Surgical History:  Procedure Laterality Date   BACK SURGERY     CARPAL TUNNEL RELEASE Left 04/30/2016    Procedure: LEFT CARPAL TUNNEL RELEASE ENDOSCOPIC;  Surgeon: Evalene JONETTA Chancy, MD;  Location: [facility];  Service: Orthopedics;  Laterality: Left;   CARPAL TUNNEL RELEASE Left 01/10/2020   Procedure: LEFT CARPAL TUNNEL RELEASE;  Surgeon: Jerri Kay HERO, MD;  Location: [facility];  Service: Orthopedics;  Laterality: Left;   CARPAL TUNNEL RELEASE Right    COLONOSCOPY  05/04/2010   COLONOSCOPY  08/31/2018   repeat 5-10 years.  Dr. Gwenith Pence, [facility]   SHOULDER ARTHROSCOPY WITH ROTATOR CUFF REPAIR AND SUBACROMIAL DECOMPRESSION Left 01/10/2020   Procedure: LEFT SHOULDER ARTHROSCOPY WITH EXTENSIVE DEBRIDEMENT, SUBACROMIAL DECOMPRESSION, DISTAL CLAVICLE EXCISION, MANIPULATION UNDER ANESTHESIA;  Surgeon: Jerri Kay HERO, MD;  Location: [facility];  Service: Orthopedics;  Laterality: Left;    MEDICATIONS:  aspirin EC 81 MG tablet   atorvastatin (LIPITOR) 20 MG tablet   celecoxib  (CELEBREX ) 200 MG capsule   gabapentin  (NEURONTIN ) 300 MG capsule   metFORMIN (GLUCOPHAGE) 500 MG tablet   oxyCODONE  (ROXICODONE ) 5 MG immediate release tablet   varenicline  (CHANTIX  CONTINUING MONTH PAK) 1 MG tablet   Varenicline  Tartrate, Starter, (CHANTIX  STARTING MONTH PAK) 0.5 MG X 11 & 1 MG X 42 TBPK   No current facility-administered medications for this encounter.   Isaiah Ruder, PA-C Surgical Short Stay/Anesthesiology [facility] Phone [phone] [facility] Phone [phone] 12/08/2023 6:16 PM

## 2023-12-09 ENCOUNTER — Other Ambulatory Visit: Payer: Self-pay | Admitting: Medical

## 2023-12-09 DIAGNOSIS — R002 Palpitations: Secondary | ICD-10-CM

## 2023-12-09 DIAGNOSIS — I2510 Atherosclerotic heart disease of native coronary artery without angina pectoris: Secondary | ICD-10-CM

## 2023-12-09 DIAGNOSIS — Z01810 Encounter for preprocedural cardiovascular examination: Secondary | ICD-10-CM

## 2023-12-10 ENCOUNTER — Ambulatory Visit: Attending: Cardiology | Admitting: Cardiology

## 2023-12-10 ENCOUNTER — Encounter: Payer: Self-pay | Admitting: Cardiology

## 2023-12-10 ENCOUNTER — Other Ambulatory Visit (HOSPITAL_COMMUNITY): Payer: Self-pay

## 2023-12-10 VITALS — BP 132/82 | HR 107 | Ht 70.0 in | Wt 190.0 lb

## 2023-12-10 DIAGNOSIS — I2510 Atherosclerotic heart disease of native coronary artery without angina pectoris: Secondary | ICD-10-CM | POA: Diagnosis not present

## 2023-12-10 DIAGNOSIS — R002 Palpitations: Secondary | ICD-10-CM | POA: Diagnosis not present

## 2023-12-10 DIAGNOSIS — I10 Essential (primary) hypertension: Secondary | ICD-10-CM

## 2023-12-10 MED ORDER — LOSARTAN POTASSIUM 25 MG PO TABS
25.0000 mg | ORAL_TABLET | Freq: Every day | ORAL | 1 refills | Status: DC
  Filled 2023-12-10: qty 90, 90d supply, fill #0
  Filled 2024-03-14: qty 90, 90d supply, fill #1

## 2023-12-10 NOTE — Telephone Encounter (Unsigned)
 Copied from CRM #[id number]. Topic: Referral - Status >> Dec 10, 2023  8:14 AM Myrick T wrote: Reason for CRM: patient stated he was having surgery on Monday but he needs to know who the cardiologist is that he will be seeing for his preop. Please call asap as he may need to reschedule.

## 2023-12-10 NOTE — Progress Notes (Signed)
 Cardiology Office Note:  .   Date:  12/10/2023  ID:  Oscar Myers, DOB 07-17-1955, MRN [id number] PCP: Bulah Alm GORMAN DEVONNA  [facility] Providers Cardiologist:  Newman Lawrence, MD PCP: Bulah Alm GORMAN, PA-C  Chief Complaint  Patient presents with   Coronary artery disease involving native coronary artery of   Palpitations     Oscar Myers is a 68 y.o. male with hypertension, hyperlipidemia, diabetes mellitus  Discussed the use of AI scribe software for clinical note transcription with the patient, who gave verbal consent to proceed.  History of Present Illness Oscar Myers is a 68 year old male who presents for preoperative cardiac evaluation due to elevated blood pressure.  He is scheduled for cervical spine surgery, which was cancelled due to elevated blood pressure readings. During a preoperative assessment, his diastolic blood pressure was 97 mmHg. Recent readings in June were 130/80 mmHg. He has no history of heart problems, chest pain, or shortness of breath. He sometimes experiences palpitations and anxiety. An EKG in June showed a heart rate of 70 bpm. He is not on any blood pressure medications.  His medical history includes diabetes, managed with metformin, and hyperlipidemia, for which he takes rosuvastatin  20 mg. His LDL was 83 mg/dL in December 2024, and his A1c was 7.2% in June 2025. He takes aspirin 81 mg daily.  Socially, he works as a Financial risk analyst and walks at least three times a week, though his activity is limited by cervical spine issues and previous leg surgery. His family history includes his mother having a heart attack related to diabetes.      Vitals:   12/10/23 1358  BP: 132/82  Pulse: (!) 107  SpO2: 96%      Review of Systems  Cardiovascular:  Negative for chest pain, dyspnea on exertion, leg swelling, palpitations and syncope.  Musculoskeletal:  Positive for neck pain.  Neurological:  Positive for numbness.         Studies Reviewed: SABRA        EKG 12/10/2023: Sinus tachycardia 107 bpm Nonspecific ST and T wave abnormality    Labs 04/2023-12/2023: Chol 147, TG 112, HDL 44, LDL 83 HbA1C 7.2% Hb 14.2 Cr 0.87   CT chest 09/2023: Lung-RADS 2, benign appearance or behavior. Continue annual screening with low-dose chest CT without contrast in 12 months. Aortic Atherosclerosis (ICD10-I70.0) and Emphysema (ICD10-J43.9).      Physical Exam Vitals and nursing note reviewed.  Constitutional:      General: He is not in acute distress. Neck:     Vascular: No JVD.  Cardiovascular:     Rate and Rhythm: Normal rate and regular rhythm.     Heart sounds: Normal heart sounds. No murmur heard. Pulmonary:     Effort: Pulmonary effort is normal.     Breath sounds: Normal breath sounds. No wheezing or rales.  Musculoskeletal:     Right lower leg: No edema.     Left lower leg: No edema.      VISIT DIAGNOSES:   ICD-10-CM   1. Coronary artery disease involving native coronary artery of native heart without angina pectoris  I25.10 EKG 12-Lead    2. Palpitations  R00.2 EKG 12-Lead    3. Primary hypertension  I10 ECHOCARDIOGRAM COMPLETE    Basic metabolic panel with GFR    Basic metabolic panel with GFR       Oscar Myers is a 68 y.o. male with  hypertension, hyperlipidemia, diabetes mellitus Assessment &  Plan Preoperative cardiovascular risk assessment for cervical spine surgery: Preoperative cardiovascular risk assessment required due to elevated blood pressure. No personal cardiac history. Good baseline functional capacity. Normal EKG in June.  No angina or dyspnea symptoms.  Low risk for perioperative cardiac events. Stress test unnecessary if echocardiogram shows no major abnormalities. - Order echocardiogram for pre-surgical clearance. - Start losartan  25 mg daily. - Stop aspirin 81 mg five to seven days before surgery. - Check blood work next week for renal function. - Coordinate  with primary care for post-surgery management.  Hypertension: Blood pressure elevated during pre-surgical evaluation, likely anxiety-related. No current antihypertensive medication. - Start losartan  25 mg daily. - Check blood work next week for renal function.  Type 2 diabetes mellitus: Type 2 diabetes managed with metformin. A1c 7.2 in June, slightly above goal. Losartan  may provide renal protection. - Start losartan  25 mg daily. - Check blood work next week for renal function.  Mixed hyperlipidemia: Hyperlipidemia managed with rosuvastatin  20 mg. LDL was 83 in December. Atorvastatin changed to rosuvastatin  due to insufficient efficacy. - Continue rosuvastatin  20 mg daily. - Follow up lipid panel with PCP.   Meds ordered this encounter  Medications   losartan  (COZAAR ) 25 MG tablet    Sig: Take 1 tablet (25 mg total) by mouth daily.    Dispense:  90 tablet    Refill:  1     F/u as needed  Signed, Newman JINNY Lawrence, MD

## 2023-12-10 NOTE — Patient Instructions (Addendum)
 Medication Instructions:  START Losartan  25 mg daily   *If you need a refill on your cardiac medications before your next appointment, please call your pharmacy*  Lab Work: BMP IN 1 WEEK  If you have labs (blood work) drawn today and your tests are completely normal, you will receive your results only by: MyChart Message (if you have MyChart) OR A paper copy in the mail If you have any lab test that is abnormal or we need to change your treatment, we will call you to review the results.  Testing/Procedures: ECHO (STAT-NEED NEXT WEEK)  Your physician has requested that you have an echocardiogram. Echocardiography is a painless test that uses sound waves to create images of your heart. It provides your doctor with information about the size and shape of your heart and how well your heart's chambers and valves are working. This procedure takes approximately one hour. There are no restrictions for this procedure. Please do NOT wear cologne, perfume, aftershave, or lotions (deodorant is allowed). Please arrive 15 minutes prior to your appointment time.  Please note: We ask at that you not bring children with you during ultrasound (echo/ vascular) testing. Due to room size and safety concerns, children are not allowed in the ultrasound rooms during exams. Our front office staff cannot provide observation of children in our lobby area while testing is being conducted. An adult accompanying a patient to their appointment will only be allowed in the ultrasound room at the discretion of the ultrasound technician under special circumstances. We apologize for any inconvenience.    Follow-Up: At [facility], you and your health needs are our priority.  As part of our continuing mission to provide you with exceptional heart care, our providers are all part of one team.  This team includes your primary Cardiologist (physician) and Advanced Practice Providers or APPs (Physician Assistants and  Nurse Practitioners) who all work together to provide you with the care you need, when you need it.  Your next appointment:   AS NEEDED   Provider:   Newman JINNY Lawrence, MD

## 2023-12-11 ENCOUNTER — Encounter: Payer: Self-pay | Admitting: Cardiology

## 2023-12-13 ENCOUNTER — Encounter (HOSPITAL_COMMUNITY): Admission: RE | Payer: Self-pay | Source: Home / Self Care

## 2023-12-13 ENCOUNTER — Inpatient Hospital Stay (HOSPITAL_COMMUNITY): Admission: RE | Admit: 2023-12-13 | Source: Home / Self Care | Admitting: Neurological Surgery

## 2023-12-13 SURGERY — ANTERIOR CERVICAL DECOMPRESSION/DISCECTOMY FUSION 4 LEVELS
Anesthesia: General

## 2023-12-17 DIAGNOSIS — I10 Essential (primary) hypertension: Secondary | ICD-10-CM | POA: Diagnosis not present

## 2023-12-17 LAB — BASIC METABOLIC PANEL WITH GFR
BUN/Creatinine Ratio: 9 — ABNORMAL LOW (ref 10–24)
BUN: 8 mg/dL (ref 8–27)
CO2: 23 mmol/L (ref 20–29)
Calcium: 10.9 mg/dL — ABNORMAL HIGH (ref 8.6–10.2)
Chloride: 101 mmol/L (ref 96–106)
Creatinine, Ser: 0.9 mg/dL (ref 0.76–1.27)
Glucose: 133 mg/dL — ABNORMAL HIGH (ref 70–99)
Potassium: 4.1 mmol/L (ref 3.5–5.2)
Sodium: 139 mmol/L (ref 134–144)
eGFR: 94 mL/min/1.73 (ref >59)

## 2023-12-21 ENCOUNTER — Ambulatory Visit (HOSPITAL_COMMUNITY)
Admission: RE | Admit: 2023-12-21 | Discharge: 2023-12-21 | Disposition: A | Discharge status: Home / Self Care | Source: Ambulatory Visit | Attending: Internal Medicine | Admitting: Internal Medicine

## 2023-12-21 DIAGNOSIS — I10 Essential (primary) hypertension: Secondary | ICD-10-CM

## 2023-12-21 DIAGNOSIS — Z01810 Encounter for preprocedural cardiovascular examination: Secondary | ICD-10-CM | POA: Diagnosis not present

## 2023-12-21 LAB — ECHOCARDIOGRAM COMPLETE
Area-P 1/2: 4.68 cm2
S' Lateral: 3.6 cm

## 2023-12-22 NOTE — Telephone Encounter (Signed)
 Pt received echocardiogram results yesterday (in chart). He would like a phone call to discuss results.

## 2023-12-22 NOTE — Telephone Encounter (Signed)
 Please call patient.  Let him know to call us  as soon as he hears back about his echocardiogram and clearance for surgery from the cardiologist.  I have the surgery clearance form just waiting on word from cardiology regarding his echocardiogram and blood pressure

## 2023-12-24 ENCOUNTER — Ambulatory Visit: Payer: Self-pay | Admitting: Cardiology

## 2023-12-24 DIAGNOSIS — I10 Essential (primary) hypertension: Secondary | ICD-10-CM

## 2023-12-24 DIAGNOSIS — I2510 Atherosclerotic heart disease of native coronary artery without angina pectoris: Secondary | ICD-10-CM

## 2023-12-24 NOTE — Progress Notes (Signed)
 Low normal heart function.  I will repeat echocardiogram in 6 months.  Okay to proceed with upcoming spine surgery  Thanks MJP

## 2023-12-24 NOTE — Telephone Encounter (Signed)
 Reviewed echocardiogram 12/23/2023.  Low normal ejection fraction.  Given that he was fairly euvolemic on my exam, without any symptoms of shortness of breath etc., I think would be reasonable to proceed with cervical spine surgery with acceptable cardiac risk.  I would recommend holding aspirin 5 to 7 days.  Thanks MJP

## 2023-12-27 NOTE — Telephone Encounter (Signed)
Pt coming in Wednesday.  

## 2023-12-29 ENCOUNTER — Ambulatory Visit (INDEPENDENT_AMBULATORY_CARE_PROVIDER_SITE_OTHER): Admitting: Medical

## 2023-12-29 VITALS — BP 122/80 | HR 113 | Wt 188.6 lb

## 2023-12-29 DIAGNOSIS — R000 Tachycardia, unspecified: Secondary | ICD-10-CM

## 2023-12-29 DIAGNOSIS — Z87891 Personal history of nicotine dependence: Secondary | ICD-10-CM

## 2023-12-29 DIAGNOSIS — E559 Vitamin D deficiency, unspecified: Secondary | ICD-10-CM

## 2023-12-29 DIAGNOSIS — E1165 Type 2 diabetes mellitus with hyperglycemia: Secondary | ICD-10-CM | POA: Diagnosis not present

## 2023-12-29 DIAGNOSIS — D696 Thrombocytopenia, unspecified: Secondary | ICD-10-CM | POA: Diagnosis not present

## 2023-12-29 DIAGNOSIS — Z01818 Encounter for other preprocedural examination: Secondary | ICD-10-CM

## 2023-12-29 DIAGNOSIS — E785 Hyperlipidemia, unspecified: Secondary | ICD-10-CM | POA: Diagnosis not present

## 2023-12-29 LAB — POCT GLYCOSYLATED HEMOGLOBIN (HGB A1C): Hemoglobin A1C: 6.6 % — AB (ref 4.0–5.6)

## 2023-12-29 NOTE — Progress Notes (Signed)
 Subjective:  Oscar Myers is a 68 y.o. male who presents for Chief Complaint  Patient presents with   Follow-up    Follow-up on surgery clearance, and need labsrecheck     Dr. Newman Myers, cardiology Dr. Alm Myers, neurosurgery Dr. Thedore [facility], [facility] Oscar Myers, Oscar RAMAN, PA-C here for primary care   Here for follow-up on labs and surgery clearance.  He is having cervical spine surgery soon with neurosurgery.  He just had echocardiogram and saw cardiology recently  He has a history of elevated pulse rate and says he does notice that his pulse stays high.  He drinks quite a bit of caffeine including Mountain Dew's regularly.  He still smokes but he has been on Chantix  and has only had 4 cigarettes in the last 28 days.  Diabetic-not checking blood sugars.  He is compliant with metformin 500 mg twice daily  Hyperlipidemia-compliant with Crestor  20 mg daily  Here for follow-up on prior labs which included elevated calcium , low platelets and low vitamin D .  He was on vitamin D  supplement till he ran out a few months ago.  No new complaint.  No other aggravating or relieving factors.    No other c/o.  Past Medical History:  Diagnosis Date   CTS (carpal tunnel syndrome)    Diabetes mellitus without complication (HCC)    type 2   Dyslipidemia    Hearing loss    currently does not wear hearing aids   Peripheral vascular disease (HCC)    Smoker    Current Outpatient Medications on File Prior to Visit  Medication Sig Dispense Refill   losartan  (COZAAR ) 25 MG tablet Take 1 tablet (25 mg total) by mouth daily. 90 tablet 1   metFORMIN (GLUCOPHAGE) 500 MG tablet Take 500 mg by mouth 2 (two) times daily with a meal.     oxyCODONE  (ROXICODONE ) 5 MG immediate release tablet Take 1 tablet (5 mg total) by mouth every 6 (six) hours as needed for severe pain (pain score 7-10). 15 tablet 0   rosuvastatin  (CRESTOR ) 20 MG tablet Take 20 mg by mouth daily.      varenicline  (CHANTIX  CONTINUING MONTH PAK) 1 MG tablet Take 1 tablet (1 mg total) by mouth 2 (two) times daily. 60 tablet 1   No current facility-administered medications on file prior to visit.    The following portions of the patient's history were reviewed and updated as appropriate: allergies, current medications, past family history, past medical history, past social history, past surgical history and problem list.  ROS Otherwise as in subjective above    Objective: BP 122/80   Pulse (!) 113   Wt 188 lb 9.6 oz (85.5 kg)   BMI 27.06 kg/m   General appearance: alert, no distress, well developed, well nourished HEENT: normocephalic, sclerae anicteric, conjunctiva pink and moist, TMs pearly, nares patent, no discharge or erythema, pharynx normal Oral cavity: MMM, no lesions Neck: supple, no lymphadenopathy, no thyromegaly, no masses, no bruits Heart: Elevated heart rate around 100, RRR, normal S1, S2, no murmurs Lungs: CTA bilaterally, no wheezes, rhonchi, or rales Pulses: 2+ radial pulses, 2+ pedal pulses, normal cap refill Ext: no edema   Assessment: Encounter Diagnoses  Name Primary?   Type 2 diabetes mellitus with hyperglycemia, without long-term current use of insulin (HCC) Yes   Elevated pulse rate    Thrombocytopenia (HCC)    Hypercalcemia    Hyperlipidemia with target LDL less than 100    Vitamin D  deficiency  Preop examination      Plan: Diabetes type 2-updated lab today, continue metformin 500 mg twice daily.  I recommend more routine glucose monitoring.  Strongly recommend cutting out soft drinks specifically [facility] that he is drinking a lot of  Elevated pulse on prior visits as well-we discussed possibly adding a beta-blocker.  He just recently saw cardiology..  Echocardiogram reviewed from 12/23/2023  Thrombocytopenia on prior labs-updated labs today.  Discussed possible causes  Hypercalcemia on prior labs-discussed possible causes.  Updated labs  today  Vitamin D  deficiency-not on current supplement for last few months.  Updated labs today.  Continue efforts to quit smoking, continue Chantix .   [name] was seen today for follow-up.  Diagnoses and all orders for this visit:  Type 2 diabetes mellitus with hyperglycemia, without long-term current use of insulin (HCC) -     HgB A1c -     Cancel: Hemoglobin A1c  Elevated pulse rate  Thrombocytopenia (HCC) -     PT and PTT; Future  Hypercalcemia -     Cancel: Comprehensive metabolic panel with GFR -     Cancel: PTH, Intact (ICMA) and Ionized Calcium  -     Cancel: TSH + free T4 -     Cancel: PTH, Intact and Calcium  -     PTH, Intact and Calcium  -     TSH + free T4 -     CMP14+EGFR  Hyperlipidemia with target LDL less than 100  Vitamin D  deficiency -     Cancel: VITAMIN D  25 Hydroxy (Vit-D Deficiency, Fractures) -     VITAMIN D  25 Hydroxy (Vit-D Deficiency, Fractures)  Preop examination   Follow up: Pending labs

## 2024-01-04 ENCOUNTER — Telehealth: Payer: Self-pay | Admitting: Family Medicine

## 2024-01-04 NOTE — Telephone Encounter (Signed)
 Copied from CRM [id number]. Topic: Referral - Status >> Jan 04, 2024  3:28 PM Graeme ORN wrote: Reason for CRM: Patient called. States He is supposed to be having surgery but there is a hold on it because they are waiting to hear back from [employer] with approval. Had appt last week. [facility] is doing surgery. Thank You

## 2024-01-04 NOTE — Telephone Encounter (Signed)
 Was there a form to be completed to be cleared.? I have not seen anything. Pt was here last week for clearance

## 2024-01-05 ENCOUNTER — Ambulatory Visit: Payer: Self-pay | Admitting: Medical

## 2024-01-05 NOTE — Telephone Encounter (Signed)
 Has Oscar Myers checking on labs

## 2024-01-05 NOTE — Progress Notes (Signed)
 I believe he came in 1 day last week for labs.  Why is it taking so long to get his results.  I need his results right away so I can clear for surgery. please inquire with lab

## 2024-01-05 NOTE — Telephone Encounter (Signed)
 Faxed over clearance form  Still waiting on Labs to come back

## 2024-01-06 LAB — TSH+FREE T4
Free T4: 1.21 ng/dL (ref 0.82–1.77)
TSH: 2.12 u[IU]/mL (ref 0.450–4.500)

## 2024-01-06 LAB — CMP14+EGFR
ALT: 11 IU/L (ref 0–44)
AST: 16 IU/L (ref 0–40)
Albumin: 4.8 g/dL (ref 3.9–4.9)
BUN/Creatinine Ratio: 10 (ref 10–24)
BUN: 10 mg/dL (ref 8–27)
Bilirubin Total: 0.5 mg/dL (ref 0.0–1.2)
Creatinine, Ser: 0.96 mg/dL (ref 0.76–1.27)
Globulin, Total: 2.5 g/dL (ref 1.5–4.5)
Glucose: 188 mg/dL — AB (ref 70–99)
Total Protein: 7.3 g/dL (ref 6.0–8.5)
eGFR: 87 mL/min/1.73 (ref >59)

## 2024-01-06 LAB — PTH, INTACT AND CALCIUM: PTH: 50 pg/mL (ref 15–65)

## 2024-01-06 LAB — VITAMIN D 25 HYDROXY (VIT D DEFICIENCY, FRACTURES)

## 2024-01-06 NOTE — Progress Notes (Signed)
 Please check with Therisa again to check with lab.  There is actually no reason the labs should not be back by now.  My guess is they lost some blood or do not have enough blood to complete the most important test I did which is the Comprehensive metabolic panel.

## 2024-01-06 NOTE — Progress Notes (Signed)
 Electrolytes are okay.  Is he still taking metformin  listed in the chart?  his sugar markers are in the diabetic range but stable

## 2024-01-06 NOTE — Progress Notes (Signed)
 Please call him about results.  Let him know there was some type of hold-up with the labs so we have not gotten results back in a timely fashion as usual  I did go ahead and send in surgery clearance to orthopedics  Diabetes marker stable at 6.6%.  Parathyroid hormone normal.  Thyroid function seems to be okay.  Still pending comprehensive metabolic panel which includes electrolytes kidney and liver.

## 2024-01-07 ENCOUNTER — Other Ambulatory Visit: Payer: Self-pay | Admitting: Internal Medicine

## 2024-01-07 DIAGNOSIS — E1165 Type 2 diabetes mellitus with hyperglycemia: Secondary | ICD-10-CM

## 2024-01-07 MED ORDER — METFORMIN HCL 500 MG PO TABS: 500.0000 mg | ORAL_TABLET | Freq: Two times a day (BID) | ORAL | 3 refills | Status: DC

## 2024-01-12 ENCOUNTER — Encounter (HOSPITAL_COMMUNITY): Payer: Self-pay

## 2024-01-12 NOTE — Progress Notes (Signed)
 Surgical Instructions   Your procedure is scheduled on Monday, September 15th. Report to [facility] Main Entrance A at 12:30 P.M., then check in with the Admitting office. Any questions or running late day of surgery: call [phone]  Questions prior to your surgery date: call [phone], Monday-Friday, 8am-4pm. If you experience any cold or flu symptoms such as cough, fever, chills, shortness of breath, etc. between now and your scheduled surgery, please notify us  at the above number.     Remember:  Do not eat or drink after midnight the night before your surgery  You may drink clear liquids until 11:30 the morning of your surgery.   Clear liquids allowed are: Water, Non-Citrus Juices (without pulp), Carbonated Beverages, Clear Tea (no milk, honey, etc.), Black Coffee Only (NO MILK, CREAM OR POWDERED CREAMER of any kind), and Gatorade.    Take these medicines the morning of surgery with A SIP OF WATER  rosuvastatin  (CRESTOR )    May take these medicines IF NEEDED: oxyCODONE  (ROXICODONE )   Do not take oral diabetes medicines Metformin  (Glucophage ) the morning of surgery.   One week prior to surgery, STOP taking any Aspirin (unless otherwise instructed by your surgeon) Aleve, Naproxen, Ibuprofen, Motrin, Advil, Goody's, BC's, all herbal medications, fish oil, and non-prescription vitamins.       Do NOT Smoke (Tobacco/Vaping) for 24 hours prior to your procedure.  If you use a CPAP at night, you may bring your mask/headgear for your overnight stay.   You will be asked to remove any contacts, glasses, piercing's, hearing aid's, dentures/partials prior to surgery. Please bring cases for these items if needed.    Patients discharged the day of surgery will not be allowed to drive home, and someone needs to stay with them for 24 hours.  SURGICAL WAITING ROOM VISITATION Patients may have no more than 2 support people in the waiting area - these visitors may rotate.   Pre-op  nurse will coordinate an appropriate time for 1 ADULT support person, who may not rotate, to accompany patient in pre-op.  Children under the age of 59 must have an adult with them who is not the patient and must remain in the main waiting area with an adult.  If the patient needs to stay at the hospital during part of their recovery, the visitor guidelines for inpatient rooms apply.  Please refer to the [facility] website for the visitor guidelines for any additional information.   If you received a COVID test during your pre-op visit  it is requested that you wear a mask when out in public, stay away from anyone that may not be feeling well and notify your surgeon if you develop symptoms. If you have been in contact with anyone that has tested positive in the last 10 days please notify you surgeon.      Pre-operative 5 CHG Bathing Instructions   You can play a key role in reducing the risk of infection after surgery. Your skin needs to be as free of germs as possible. You can reduce the number of germs on your skin by washing with CHG (chlorhexidine  gluconate) soap before surgery. CHG is an antiseptic soap that kills germs and continues to kill germs even after washing.   DO NOT use if you have an allergy to chlorhexidine /CHG or antibacterial soaps. If your skin becomes reddened or irritated, stop using the CHG and notify one of our RNs at [phone].   Please shower with the CHG soap starting 4 days  before surgery using the following schedule:     Please keep in mind the following:  DO NOT shave, including legs and underarms, starting the day of your first shower.   You may shave your face at any point before/day of surgery.  Place clean sheets on your bed the day you start using CHG soap. Use a clean washcloth (not used since being washed) for each shower. DO NOT sleep with pets once you start using the CHG.   CHG Shower Instructions:  Wash your face and private area with normal  soap. If you choose to wash your hair, wash first with your normal shampoo.  After you use shampoo/soap, rinse your hair and body thoroughly to remove shampoo/soap residue.  Turn the water OFF and apply about 3 tablespoons (45 ml) of CHG soap to a CLEAN washcloth.  Apply CHG soap ONLY FROM YOUR NECK DOWN TO YOUR TOES (washing for 3-5 minutes)  DO NOT use CHG soap on face, private areas, open wounds, or sores.  Pay special attention to the area where your surgery is being performed.  If you are having back surgery, having someone wash your back for you may be helpful. Wait 2 minutes after CHG soap is applied, then you may rinse off the CHG soap.  Pat dry with a clean towel  Put on clean clothes/pajamas   If you choose to wear lotion, please use ONLY the CHG-compatible lotions that are listed below.  Additional instructions for the day of surgery: DO NOT APPLY any lotions, deodorants, cologne, or perfumes.   Do not bring valuables to the hospital. [facility] is not responsible for any belongings/valuables. Do not wear nail polish, gel polish, artificial nails, or any other type of covering on natural nails (fingers and toes) Do not wear jewelry or makeup Put on clean/comfortable clothes.  Please brush your teeth.  Ask your nurse before applying any prescription medications to the skin.     CHG Compatible Lotions   Aveeno Moisturizing lotion  Cetaphil Moisturizing Cream  Cetaphil Moisturizing Lotion  Clairol Herbal Essence Moisturizing Lotion, Dry Skin  Clairol Herbal Essence Moisturizing Lotion, Extra Dry Skin  Clairol Herbal Essence Moisturizing Lotion, Normal Skin  Curel Age Defying Therapeutic Moisturizing Lotion with Alpha Hydroxy  Curel Extreme Care Body Lotion  Curel Soothing Hands Moisturizing Hand Lotion  Curel Therapeutic Moisturizing Cream, Fragrance-Free  Curel Therapeutic Moisturizing Lotion, Fragrance-Free  Curel Therapeutic Moisturizing Lotion, Original Formula   Eucerin Daily Replenishing Lotion  Eucerin Dry Skin Therapy Plus Alpha Hydroxy Crme  Eucerin Dry Skin Therapy Plus Alpha Hydroxy Lotion  Eucerin Original Crme  Eucerin Original Lotion  Eucerin Plus Crme Eucerin Plus Lotion  Eucerin TriLipid Replenishing Lotion  Keri Anti-Bacterial Hand Lotion  Keri Deep Conditioning Original Lotion Dry Skin Formula Softly Scented  Keri Deep Conditioning Original Lotion, Fragrance Free Sensitive Skin Formula  Keri Lotion Fast Absorbing Fragrance Free Sensitive Skin Formula  Keri Lotion Fast Absorbing Softly Scented Dry Skin Formula  Keri Original Lotion  Keri Skin Renewal Lotion Keri Silky Smooth Lotion  Keri Silky Smooth Sensitive Skin Lotion  Nivea Body Creamy Conditioning Oil  Nivea Body Extra Enriched Lotion  Nivea Body Original Lotion  Nivea Body Sheer Moisturizing Lotion Nivea Crme  Nivea Skin Firming Lotion  NutraDerm 30 Skin Lotion  NutraDerm Skin Lotion  NutraDerm Therapeutic Skin Cream  NutraDerm Therapeutic Skin Lotion  ProShield Protective Hand Cream  Provon moisturizing lotion  Please read over the following fact sheets that you were given.

## 2024-01-13 ENCOUNTER — Encounter (HOSPITAL_COMMUNITY): Payer: Self-pay

## 2024-01-13 ENCOUNTER — Encounter (HOSPITAL_COMMUNITY)
Admission: RE | Admit: 2024-01-13 | Discharge: 2024-01-13 | Disposition: A | Discharge status: Home / Self Care | Source: Ambulatory Visit | Attending: Neurological Surgery | Admitting: Neurological Surgery

## 2024-01-13 ENCOUNTER — Other Ambulatory Visit: Payer: Self-pay

## 2024-01-13 VITALS — BP 140/89 | HR 78 | Temp 98.3°F | Resp 18 | Ht 70.0 in | Wt 190.0 lb

## 2024-01-13 DIAGNOSIS — Z01818 Encounter for other preprocedural examination: Secondary | ICD-10-CM | POA: Insufficient documentation

## 2024-01-13 HISTORY — DX: Essential (primary) hypertension: I10

## 2024-01-13 LAB — BASIC METABOLIC PANEL WITH GFR
Anion gap: 11 (ref 5–15)
BUN: 11 mg/dL (ref 8–23)
CO2: 26 mmol/L (ref 22–32)
Calcium: 10.9 mg/dL — ABNORMAL HIGH (ref 8.9–10.3)
Chloride: 100 mmol/L (ref 98–111)
Creatinine, Ser: 0.85 mg/dL (ref 0.61–1.24)
GFR, Estimated: >60 mL/min (ref >60)
Glucose, Bld: 142 mg/dL — ABNORMAL HIGH (ref 70–99)
Potassium: 4.3 mmol/L (ref 3.5–5.1)
Sodium: 137 mmol/L (ref 135–145)

## 2024-01-13 LAB — CBC
HCT: 41.2 % (ref 39.0–52.0)
Hemoglobin: 13.6 g/dL (ref 13.0–17.0)
MCH: 29.7 pg (ref 26.0–34.0)
MCHC: 33 g/dL (ref 30.0–36.0)
MCV: 90 fL (ref 80.0–100.0)
Platelets: 122 K/uL — ABNORMAL LOW (ref 150–400)
RBC: 4.58 MIL/uL (ref 4.22–5.81)
RDW: 12.1 % (ref 11.5–15.5)
WBC: 3.9 K/uL — ABNORMAL LOW (ref 4.0–10.5)
nRBC: 0 % (ref 0.0–0.2)

## 2024-01-13 LAB — SURGICAL PCR SCREEN
MRSA, PCR: NEGATIVE
Staphylococcus aureus: NEGATIVE

## 2024-01-13 LAB — GLUCOSE, CAPILLARY: Glucose-Capillary: 153 mg/dL — ABNORMAL HIGH (ref 70–99)

## 2024-01-13 LAB — TYPE AND SCREEN
ABO/RH(D): O POS
Antibody Screen: NEGATIVE

## 2024-01-13 NOTE — Progress Notes (Signed)
 Surgical preop orders have been requested.

## 2024-01-13 NOTE — Progress Notes (Signed)
 PCP - Dr. Alm Gent Cardiologist - Dr. Newman Lawrence LOV: 12-10-23, follow up as needed  PPM/ICD - Denies Device Orders - n/a Rep Notified - n/a  Chest x-ray - N/A EKG - 12-10-23 Stress Test - Denies ECHO - 12-23-23 Cardiac Cath - Denies  Sleep Study - Denies CPAP - n/a  Fasting Blood Sugar - 110-140 Checks Blood Sugar - per patient only checks when he remembers to  Last dose of GLP1 agonist-  Denies GLP1 instructions: n/a  Blood Thinner Instructions: Denies Aspirin Instructions:Denies  ERAS Protcol - Clears until 0930 PRE-SURGERY Ensure or G2- none  COVID TEST- n/a   Anesthesia review: Yes, HTN, PVD, DM  Patient denies shortness of breath, fever, cough and chest pain at PAT appointment. Patient denies any respiratory issues at this time.    All instructions explained to the patient, with a verbal understanding of the material. Patient agrees to go over the instructions while at home for a better understanding. Patient also instructed to self quarantine after being tested for COVID-19. The opportunity to ask questions was provided.

## 2024-01-14 NOTE — Anesthesia Preprocedure Evaluation (Addendum)
 Anesthesia Evaluation  Patient identified by MRN, date of birth, ID band Patient awake    Reviewed: Allergy & Precautions, NPO status , Patient's Chart, lab work & pertinent test results  Airway Mallampati: II  TM Distance: >3 FB Neck ROM: Limited    Dental  (+) Partial Lower, Partial Upper   Pulmonary former smoker   Pulmonary exam normal        Cardiovascular hypertension, + Peripheral Vascular Disease   Rhythm:Regular Rate:Normal  Echo 12/23/2023: IMPRESSIONS   1. Left ventricular ejection fraction, by estimation, is 50 to 55%. Left  ventricular ejection fraction by 3D volume is 50 %. The left ventricle has  low normal function. The left ventricle demonstrates global hypokinesis.  Left ventricular diastolic  parameters are consistent with Grade I diastolic dysfunction (impaired  relaxation).   2. Right ventricular systolic function is normal. The right ventricular  size is normal. Tricuspid regurgitation signal is inadequate for assessing  PA pressure.   3. The mitral valve is normal in structure. No evidence of mitral valve  regurgitation. No evidence of mitral stenosis.   4. The aortic valve is tricuspid. Aortic valve regurgitation is not  visualized. Aortic valve sclerosis/calcification is present, without any  evidence of aortic stenosis.   5. The inferior vena cava is normal in size with greater than 50%  respiratory variability, suggesting right atrial pressure of 3 mmHg.      In June 2008: Stress Cardiolite negative for ischemia.  Low-normal ejection fraction of 49%, but 2D echo shows normal LV function, no wall motion abnormality, ejection fraction 50%.    Neuro/Psych negative neurological ROS  negative psych ROS   GI/Hepatic negative GI ROS, Neg liver ROS,,,  Endo/Other  diabetes, Type 2, Oral Hypoglycemic Agents    Renal/GU negative Renal ROS  negative genitourinary   Musculoskeletal  (+) Arthritis  ,    Abdominal Normal abdominal exam  (+)   Peds  Hematology Lab Results      Component                Value               Date                      WBC                      3.9 (L)             01/13/2024                HGB                      13.6                01/13/2024                HCT                      41.2                01/13/2024                MCV                      90.0                01/13/2024  PLT                      122 (L)             01/13/2024             Lab Results      Component                Value               Date                      NA                       137                 01/13/2024                K                        4.3                 01/13/2024                CO2                      26                  01/13/2024                GLUCOSE                  142 (H)             01/13/2024                BUN                      11                  01/13/2024                CREATININE               0.85                01/13/2024                CALCIUM                   10.9 (H)            01/13/2024                EGFR                     87                  12/29/2023                GFRNONAA                 >60                 01/13/2024              Anesthesia Other Findings   Reproductive/Obstetrics  Anesthesia Physical Anesthesia Plan  ASA: 3  Anesthesia Plan: General   Post-op Pain Management: Celebrex  PO (pre-op)*, Tylenol  PO (pre-op)* and Ketamine  IV*   Induction: Intravenous  PONV Risk Score and Plan: 2 and Ondansetron , Dexamethasone , Midazolam  and Treatment may vary due to age or medical condition  Airway Management Planned: Mask, Oral ETT and Video Laryngoscope Planned  Additional Equipment: None  Intra-op Plan:   Post-operative Plan: Extubation in OR  Informed Consent: I have reviewed the patients History and Physical, chart, labs and discussed the  procedure including the risks, benefits and alternatives for the proposed anesthesia with the patient or authorized representative who has indicated his/her understanding and acceptance.     Dental advisory given  Plan Discussed with: CRNA  Anesthesia Plan Comments: (PAT note written 01/14/2024 by Allison Zelenak, PA-C.  )         Anesthesia Quick Evaluation

## 2024-01-14 NOTE — Progress Notes (Signed)
 Anesthesia Chart Review:  Case: [id number] Date/Time: 01/17/24 1245   Procedure: ANTERIOR CERVICAL DECOMPRESSION/DISCECTOMY FUSION 4 LEVELS - ACDF - C3-C4 - C4-C5 - C5-C6 - C6-C7   Anesthesia type: General   Diagnosis: Radiculopathy, cervical region [M54.12]   Pre-op diagnosis: Radiculopathy cervical region   [facility]: [facility] ROOM 20 / [facility]   Surgeons: Joshua Alm Hamilton, MD       DISCUSSION: Patient is a 68 year old male scheduled for the above procedure. Surgery was initially planned for 12/13/2023, but postponed until he could get a preoperative cardiology evaluation for palpitations (improved since decreasing caffeine) and HTN. He also had coronary calcifications on 05/20/2023 [facility] Chest CT.   History includes former smoker, DM2, PVD, dyslipidemia, hearing loss, spinal surgery.   He was seen by cardiologist Dr. Elmira on 12/10/2023: Preoperative cardiovascular risk assessment required due to elevated blood pressure. No personal cardiac history. Good baseline functional capacity. Normal EKG in June.  No angina or dyspnea symptoms.  Low risk for perioperative cardiac events. Stress test unnecessary if echocardiogram shows no major abnormalities. He was started on losartan  for HTN. Already on rosuvastatin .  Echo was done on 12/21/2023 and showed LVEF 50-55%, LV global hypokinesis, grade 1 DD, normal RV systolic function, AV sclerosis without stenosis. Dr. Elmira wants to repeat TTE in 6 months, but otherwise felt Oscar Myers was Okay to proceed with upcoming spine surgery.  Anesthesia team to evaluate on the day of surgery.   VS: BP (!) 140/89   Pulse 78   Temp 36.8 C   Resp 18   Ht 5' 10 (1.778 m)   Wt 86.2 kg   SpO2 98%   BMI 27.26 kg/m   PROVIDERS: Tysinger, Alm RAMAN, PA-C is PCP  Oscar Penman, MD is cardiologist   LABS: Preoperative labs noted. Ca 10.9, consistent with prior results. PLT count 122K, previously 147K on 12/07/2023. LFTs normal 12/29/2023. A1c 6.6% on  12/29/2023.  (all labs ordered are listed, but only abnormal results are displayed)  Labs Reviewed  GLUCOSE, CAPILLARY - Abnormal; Notable for the following components:      Result Value   Glucose-Capillary 153 (*)    All other components within normal limits  CBC - Abnormal; Notable for the following components:   WBC 3.9 (*)    Platelets 122 (*)    All other components within normal limits  BASIC METABOLIC PANEL WITH GFR - Abnormal; Notable for the following components:   Glucose, Bld 142 (*)    Calcium  10.9 (*)    All other components within normal limits  SURGICAL PCR SCREEN  TYPE AND SCREEN    IMAGES: CT C-spine 09/20/23: IMPRESSION: 1. No acute fracture or malalignment of the cervical spine. 2. 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 3. Moderate central canal stenosis at C6-7 and C7-T1 with flattening of the thecal sac with an AP diameter of the spinal canal of 6-7 mm. 4. Moderate to severe bilateral neuroforaminal narrowing throughout the cervical spine, most severe on the right at C3-4, C4-5, and C7-T1 and on the left at C2-3, C3-4, C4-5 and C6-7.   CT Chest LCS 05/20/23: IMPRESSION: - Lung-RADS 2, benign appearance or behavior. Continue annual screening with low-dose chest CT without contrast in 12 months. - Aortic Atherosclerosis (ICD10-I70.0) and Emphysema (ICD10-J43.9).     EKG: EKG 12/10/2023: Sinus tachycardia 107 bpm Nonspecific ST and T wave abnormality Confirmed by Oscar Myers [id number]) on 12/10/2023 1:58:47 PM    CV: Echo  12/23/2023: IMPRESSIONS   1. Left ventricular ejection fraction, by estimation, is 50 to 55%. Left  ventricular ejection fraction by 3D volume is 50 %. The left ventricle has  low normal function. The left ventricle demonstrates global hypokinesis.  Left ventricular diastolic  parameters are consistent with Grade I diastolic dysfunction (impaired  relaxation).   2. Right  ventricular systolic function is normal. The right ventricular  size is normal. Tricuspid regurgitation signal is inadequate for assessing  PA pressure.   3. The mitral valve is normal in structure. No evidence of mitral valve  regurgitation. No evidence of mitral stenosis.   4. The aortic valve is tricuspid. Aortic valve regurgitation is not  visualized. Aortic valve sclerosis/calcification is present, without any  evidence of aortic stenosis.   5. The inferior vena cava is normal in size with greater than 50%  respiratory variability, suggesting right atrial pressure of 3 mmHg.     In June 2008: Stress Cardiolite negative for ischemia.  Low-normal ejection fraction of 49%, but 2D echo shows normal LV function, no wall motion abnormality, ejection fraction 50%.    Past Medical History:  Diagnosis Date   CTS (carpal tunnel syndrome)    Diabetes mellitus without complication (HCC)    type 2   Dyslipidemia    Hearing loss    currently does not wear hearing aids   Hypertension    Peripheral vascular disease (HCC)    Smoker     Past Surgical History:  Procedure Laterality Date   BACK SURGERY     CARPAL TUNNEL RELEASE Left 04/30/2016   Procedure: LEFT CARPAL TUNNEL RELEASE ENDOSCOPIC;  Surgeon: Evalene JONETTA Chancy, MD;  Location: [facility];  Service: Orthopedics;  Laterality: Left;   CARPAL TUNNEL RELEASE Left 01/10/2020   Procedure: LEFT CARPAL TUNNEL RELEASE;  Surgeon: Jerri Kay HERO, MD;  Location: [facility];  Service: Orthopedics;  Laterality: Left;   CARPAL TUNNEL RELEASE Right    COLONOSCOPY  05/04/2010   COLONOSCOPY  08/31/2018   repeat 5-10 years.  Dr. Gwenith Pence, [facility]   SHOULDER ARTHROSCOPY WITH ROTATOR CUFF REPAIR AND SUBACROMIAL DECOMPRESSION Left 01/10/2020   Procedure: LEFT SHOULDER ARTHROSCOPY WITH EXTENSIVE DEBRIDEMENT, SUBACROMIAL DECOMPRESSION, DISTAL CLAVICLE EXCISION, MANIPULATION UNDER ANESTHESIA;  Surgeon: Jerri Kay HERO, MD;  Location: [facility];  Service: Orthopedics;  Laterality: Left;    MEDICATIONS:  celecoxib  (CELEBREX ) 200 MG capsule   losartan  (COZAAR ) 25 MG tablet   metFORMIN  (GLUCOPHAGE ) 500 MG tablet   oxyCODONE  (ROXICODONE ) 5 MG immediate release tablet   rosuvastatin  (CRESTOR ) 20 MG tablet   varenicline  (CHANTIX  CONTINUING MONTH PAK) 1 MG tablet   No current facility-administered medications for this encounter.    Isaiah Ruder, PA-C Surgical Short Stay/Anesthesiology [facility] Phone [phone] [facility] Phone [phone] 01/14/2024 1:16 PM

## 2024-01-17 ENCOUNTER — Inpatient Hospital Stay (HOSPITAL_COMMUNITY)
Admission: RE | Disposition: A | Discharge status: Home / Self Care | Payer: Self-pay | Source: Home / Self Care | Attending: Neurological Surgery

## 2024-01-17 ENCOUNTER — Inpatient Hospital Stay (HOSPITAL_COMMUNITY)
Admission: RE | Admit: 2024-01-17 | Discharge: 2024-01-18 | DRG: 473 | Disposition: A | Discharge status: Home / Self Care | Attending: Neurological Surgery | Admitting: Neurological Surgery

## 2024-01-17 ENCOUNTER — Inpatient Hospital Stay (HOSPITAL_COMMUNITY): Payer: Self-pay | Admitting: Vascular Surgery

## 2024-01-17 ENCOUNTER — Other Ambulatory Visit: Payer: Self-pay

## 2024-01-17 ENCOUNTER — Encounter (HOSPITAL_COMMUNITY): Payer: Self-pay | Admitting: Neurological Surgery

## 2024-01-17 ENCOUNTER — Inpatient Hospital Stay (HOSPITAL_COMMUNITY)

## 2024-01-17 DIAGNOSIS — F17210 Nicotine dependence, cigarettes, uncomplicated: Secondary | ICD-10-CM | POA: Diagnosis not present

## 2024-01-17 DIAGNOSIS — Z7984 Long term (current) use of oral hypoglycemic drugs: Secondary | ICD-10-CM

## 2024-01-17 DIAGNOSIS — E785 Hyperlipidemia, unspecified: Secondary | ICD-10-CM | POA: Diagnosis present

## 2024-01-17 DIAGNOSIS — H9190 Unspecified hearing loss, unspecified ear: Secondary | ICD-10-CM | POA: Diagnosis not present

## 2024-01-17 DIAGNOSIS — M50223 Other cervical disc displacement at C6-C7 level: Secondary | ICD-10-CM | POA: Diagnosis not present

## 2024-01-17 DIAGNOSIS — M47812 Spondylosis without myelopathy or radiculopathy, cervical region: Secondary | ICD-10-CM | POA: Diagnosis not present

## 2024-01-17 DIAGNOSIS — M4722 Other spondylosis with radiculopathy, cervical region: Secondary | ICD-10-CM | POA: Diagnosis present

## 2024-01-17 DIAGNOSIS — E119 Type 2 diabetes mellitus without complications: Secondary | ICD-10-CM | POA: Diagnosis not present

## 2024-01-17 DIAGNOSIS — M5412 Radiculopathy, cervical region: Secondary | ICD-10-CM | POA: Diagnosis not present

## 2024-01-17 DIAGNOSIS — Z981 Arthrodesis status: Principal | ICD-10-CM

## 2024-01-17 DIAGNOSIS — Z87891 Personal history of nicotine dependence: Secondary | ICD-10-CM

## 2024-01-17 DIAGNOSIS — E1151 Type 2 diabetes mellitus with diabetic peripheral angiopathy without gangrene: Secondary | ICD-10-CM | POA: Diagnosis not present

## 2024-01-17 DIAGNOSIS — M4802 Spinal stenosis, cervical region: Secondary | ICD-10-CM | POA: Diagnosis not present

## 2024-01-17 DIAGNOSIS — I10 Essential (primary) hypertension: Secondary | ICD-10-CM | POA: Diagnosis present

## 2024-01-17 DIAGNOSIS — Z79899 Other long term (current) drug therapy: Secondary | ICD-10-CM | POA: Diagnosis not present

## 2024-01-17 HISTORY — PX: ANTERIOR CERVICAL DECOMPRESSION/DISCECTOMY FUSION 4 LEVELS: SHX5556

## 2024-01-17 LAB — GLUCOSE, CAPILLARY
Glucose-Capillary: 138 mg/dL — ABNORMAL HIGH (ref 70–99)
Glucose-Capillary: 125 mg/dL — ABNORMAL HIGH (ref 70–99)
Glucose-Capillary: 158 mg/dL — ABNORMAL HIGH (ref 70–99)
Glucose-Capillary: 186 mg/dL — ABNORMAL HIGH (ref 70–99)
Glucose-Capillary: 237 mg/dL — ABNORMAL HIGH (ref 70–99)

## 2024-01-17 SURGERY — ANTERIOR CERVICAL DECOMPRESSION/DISCECTOMY FUSION 4 LEVELS
Anesthesia: General

## 2024-01-17 SURGERY — Surgical Case
Anesthesia: *Unknown

## 2024-01-17 MED ORDER — ACETAMINOPHEN 500 MG PO TABS
1000.0000 mg | ORAL_TABLET | Freq: Once | ORAL | Status: AC
Start: 2024-01-17 — End: 2024-01-17
  Administered 2024-01-17: 1000 mg via ORAL

## 2024-01-17 MED ORDER — ACETAMINOPHEN 500 MG PO TABS
ORAL_TABLET | ORAL | Status: AC
  Filled 2024-01-17: qty 2

## 2024-01-17 MED ORDER — THROMBIN 5000 UNITS EX KIT
PACK | CUTANEOUS | Status: AC
  Filled 2024-01-17: qty 1

## 2024-01-17 MED ORDER — PHENOL 1.4 % MT LIQD: 1.0000 | OROMUCOSAL | Status: DC | PRN

## 2024-01-17 MED ORDER — LACTATED RINGERS IV SOLN: INTRAVENOUS | Status: DC

## 2024-01-17 MED ORDER — LOSARTAN POTASSIUM 25 MG PO TABS
25.0000 mg | ORAL_TABLET | Freq: Every day | ORAL | Status: DC
Start: 2024-01-17 — End: 2024-01-18
  Administered 2024-01-17: 25 mg via ORAL
  Filled 2024-01-17: qty 1

## 2024-01-17 MED ORDER — BUPIVACAINE HCL (PF) 0.25 % IJ SOLN
INTRAMUSCULAR | Status: AC
  Filled 2024-01-17: qty 30

## 2024-01-17 MED ORDER — POTASSIUM CHLORIDE IN NACL 20-0.9 MEQ/L-% IV SOLN
INTRAVENOUS | Status: DC
  Filled 2024-01-17 (×2): qty 1000

## 2024-01-17 MED ORDER — FENTANYL CITRATE (PF) 100 MCG/2ML IJ SOLN
INTRAMUSCULAR | Status: AC
  Filled 2024-01-17: qty 2

## 2024-01-17 MED ORDER — MENTHOL 3 MG MT LOZG: 1.0000 | LOZENGE | OROMUCOSAL | Status: DC | PRN

## 2024-01-17 MED ORDER — ROCURONIUM BROMIDE 10 MG/ML (PF) SYRINGE
PREFILLED_SYRINGE | INTRAVENOUS | Status: AC
  Filled 2024-01-17: qty 10

## 2024-01-17 MED ORDER — CEFAZOLIN SODIUM-DEXTROSE 2-4 GM/100ML-% IV SOLN
2.0000 g | Freq: Three times a day (TID) | INTRAVENOUS | Status: AC
  Administered 2024-01-17 – 2024-01-18 (×2): 2 g via INTRAVENOUS
  Filled 2024-01-17 (×2): qty 100

## 2024-01-17 MED ORDER — ONDANSETRON HCL 4 MG PO TABS: 4.0000 mg | ORAL_TABLET | Freq: Four times a day (QID) | ORAL | Status: DC | PRN

## 2024-01-17 MED ORDER — METHOCARBAMOL 500 MG PO TABS
500.0000 mg | ORAL_TABLET | Freq: Four times a day (QID) | ORAL | Status: DC | PRN
  Administered 2024-01-17 – 2024-01-18 (×2): 500 mg via ORAL
  Filled 2024-01-17 (×2): qty 1

## 2024-01-17 MED ORDER — SUGAMMADEX SODIUM 200 MG/2ML IV SOLN
INTRAVENOUS | Status: DC | PRN
  Administered 2024-01-17: 200 mg via INTRAVENOUS

## 2024-01-17 MED ORDER — LIDOCAINE 2% (20 MG/ML) 5 ML SYRINGE
INTRAMUSCULAR | Status: DC | PRN
  Administered 2024-01-17: 80 mg via INTRAVENOUS

## 2024-01-17 MED ORDER — ROCURONIUM BROMIDE 10 MG/ML (PF) SYRINGE
PREFILLED_SYRINGE | INTRAVENOUS | Status: DC | PRN
  Administered 2024-01-17 (×2): 10 mg via INTRAVENOUS
  Administered 2024-01-17: 70 mg via INTRAVENOUS

## 2024-01-17 MED ORDER — CEFAZOLIN SODIUM 1 G IJ SOLR
INTRAMUSCULAR | Status: AC
  Filled 2024-01-17: qty 20

## 2024-01-17 MED ORDER — DEXAMETHASONE SODIUM PHOSPHATE 4 MG/ML IJ SOLN
4.0000 mg | Freq: Four times a day (QID) | INTRAMUSCULAR | Status: DC
  Administered 2024-01-17: 4 mg via INTRAVENOUS
  Filled 2024-01-17: qty 1

## 2024-01-17 MED ORDER — KETAMINE HCL 50 MG/5ML IJ SOSY
PREFILLED_SYRINGE | INTRAMUSCULAR | Status: DC | PRN
Start: 2024-01-17 — End: 2024-01-17
  Administered 2024-01-17: 20 mg via INTRAVENOUS
  Administered 2024-01-17 (×2): 10 mg via INTRAVENOUS

## 2024-01-17 MED ORDER — DEXAMETHASONE 4 MG PO TABS
4.0000 mg | ORAL_TABLET | Freq: Four times a day (QID) | ORAL | Status: DC
  Administered 2024-01-18 (×2): 4 mg via ORAL
  Filled 2024-01-17 (×2): qty 1

## 2024-01-17 MED ORDER — ONDANSETRON HCL 4 MG/2ML IJ SOLN
INTRAMUSCULAR | Status: DC | PRN
  Administered 2024-01-17: 4 mg via INTRAVENOUS

## 2024-01-17 MED ORDER — MIDAZOLAM HCL 2 MG/2ML IJ SOLN
INTRAMUSCULAR | Status: DC | PRN
  Administered 2024-01-17: 2 mg via INTRAVENOUS

## 2024-01-17 MED ORDER — SODIUM CHLORIDE 0.9% FLUSH: 3.0000 mL | INTRAVENOUS | Status: DC | PRN

## 2024-01-17 MED ORDER — ACETAMINOPHEN 500 MG PO TABS
1000.0000 mg | ORAL_TABLET | Freq: Four times a day (QID) | ORAL | Status: DC
  Administered 2024-01-17 – 2024-01-18 (×3): 1000 mg via ORAL
  Filled 2024-01-17 (×3): qty 2

## 2024-01-17 MED ORDER — ONDANSETRON HCL 4 MG/2ML IJ SOLN
INTRAMUSCULAR | Status: AC
  Filled 2024-01-17: qty 2

## 2024-01-17 MED ORDER — PHENYLEPHRINE HCL-NACL 20-0.9 MG/250ML-% IV SOLN
INTRAVENOUS | Status: DC | PRN
  Administered 2024-01-17: 30 ug/min via INTRAVENOUS

## 2024-01-17 MED ORDER — FENTANYL CITRATE (PF) 250 MCG/5ML IJ SOLN
INTRAMUSCULAR | Status: AC
  Filled 2024-01-17: qty 5

## 2024-01-17 MED ORDER — CHLORHEXIDINE GLUCONATE 0.12 % MT SOLN
15.0000 mL | Freq: Once | OROMUCOSAL | Status: AC
  Administered 2024-01-17: 15 mL via OROMUCOSAL
  Filled 2024-01-17: qty 15

## 2024-01-17 MED ORDER — INSULIN ASPART 100 UNIT/ML IJ SOLN
0.0000 [IU] | Freq: Every day | INTRAMUSCULAR | Status: DC
  Administered 2024-01-17: 2 [IU] via SUBCUTANEOUS

## 2024-01-17 MED ORDER — METHOCARBAMOL 1000 MG/10ML IJ SOLN: 500.0000 mg | Freq: Four times a day (QID) | INTRAMUSCULAR | Status: DC | PRN

## 2024-01-17 MED ORDER — SODIUM CHLORIDE 0.9 % IV SOLN: 250.0000 mL | INTRAVENOUS | Status: DC

## 2024-01-17 MED ORDER — SENNA 8.6 MG PO TABS
1.0000 | ORAL_TABLET | Freq: Two times a day (BID) | ORAL | Status: DC
  Administered 2024-01-17: 8.6 mg via ORAL
  Filled 2024-01-17: qty 1

## 2024-01-17 MED ORDER — 0.9 % SODIUM CHLORIDE (POUR BTL) OPTIME
TOPICAL | Status: DC | PRN
  Administered 2024-01-17: 1000 mL

## 2024-01-17 MED ORDER — INSULIN ASPART 100 UNIT/ML IJ SOLN: 0.0000 [IU] | INTRAMUSCULAR | Status: DC | PRN

## 2024-01-17 MED ORDER — MIDAZOLAM HCL 2 MG/2ML IJ SOLN
INTRAMUSCULAR | Status: AC
Start: 2024-01-17 — End: 2024-01-17
  Filled 2024-01-17: qty 2

## 2024-01-17 MED ORDER — VARENICLINE TARTRATE 1 MG PO TABS
1.0000 mg | ORAL_TABLET | Freq: Two times a day (BID) | ORAL | Status: DC
  Filled 2024-01-17: qty 1

## 2024-01-17 MED ORDER — FENTANYL CITRATE (PF) 250 MCG/5ML IJ SOLN
INTRAMUSCULAR | Status: DC | PRN
  Administered 2024-01-17 (×2): 50 ug via INTRAVENOUS
  Administered 2024-01-17: 100 ug via INTRAVENOUS
  Administered 2024-01-17 (×2): 50 ug via INTRAVENOUS

## 2024-01-17 MED ORDER — ORAL CARE MOUTH RINSE: 15.0000 mL | Freq: Once | OROMUCOSAL | Status: AC

## 2024-01-17 MED ORDER — MORPHINE SULFATE (PF) 2 MG/ML IV SOLN: 2.0000 mg | INTRAVENOUS | Status: DC | PRN

## 2024-01-17 MED ORDER — SODIUM CHLORIDE 0.9% FLUSH
3.0000 mL | Freq: Two times a day (BID) | INTRAVENOUS | Status: DC
  Administered 2024-01-17: 3 mL via INTRAVENOUS

## 2024-01-17 MED ORDER — INSULIN ASPART 100 UNIT/ML IJ SOLN
0.0000 [IU] | Freq: Three times a day (TID) | INTRAMUSCULAR | Status: DC
  Administered 2024-01-18: 3 [IU] via SUBCUTANEOUS

## 2024-01-17 MED ORDER — DEXAMETHASONE SODIUM PHOSPHATE 10 MG/ML IJ SOLN
INTRAMUSCULAR | Status: DC | PRN
  Administered 2024-01-17: 10 mg via INTRAVENOUS

## 2024-01-17 MED ORDER — ONDANSETRON HCL 4 MG/2ML IJ SOLN: 4.0000 mg | Freq: Four times a day (QID) | INTRAMUSCULAR | Status: DC | PRN

## 2024-01-17 MED ORDER — THROMBIN 5000 UNITS EX SOLR
OROMUCOSAL | Status: DC | PRN
  Administered 2024-01-17 (×2): 5 mL via TOPICAL

## 2024-01-17 MED ORDER — OXYCODONE HCL 5 MG PO TABS
5.0000 mg | ORAL_TABLET | Freq: Four times a day (QID) | ORAL | Status: DC | PRN
  Administered 2024-01-17 – 2024-01-18 (×3): 5 mg via ORAL
  Filled 2024-01-17 (×3): qty 1

## 2024-01-17 MED ORDER — PHENYLEPHRINE 80 MCG/ML (10ML) SYRINGE FOR IV PUSH (FOR BLOOD PRESSURE SUPPORT)
PREFILLED_SYRINGE | INTRAVENOUS | Status: DC | PRN
  Administered 2024-01-17: 160 ug via INTRAVENOUS

## 2024-01-17 MED ORDER — BUPIVACAINE HCL (PF) 0.25 % IJ SOLN
INTRAMUSCULAR | Status: DC | PRN
  Administered 2024-01-17: 6 mL

## 2024-01-17 MED ORDER — DEXAMETHASONE SODIUM PHOSPHATE 10 MG/ML IJ SOLN
INTRAMUSCULAR | Status: AC
  Filled 2024-01-17: qty 1

## 2024-01-17 MED ORDER — PROPOFOL 10 MG/ML IV BOLUS
INTRAVENOUS | Status: DC | PRN
  Administered 2024-01-17: 50 mg via INTRAVENOUS
  Administered 2024-01-17: 150 mg via INTRAVENOUS

## 2024-01-17 MED ORDER — CELECOXIB 200 MG PO CAPS
200.0000 mg | ORAL_CAPSULE | Freq: Once | ORAL | Status: AC
  Administered 2024-01-17: 200 mg via ORAL
  Filled 2024-01-17: qty 1

## 2024-01-17 MED ORDER — AMISULPRIDE (ANTIEMETIC) 5 MG/2ML IV SOLN: 10.0000 mg | Freq: Once | INTRAVENOUS | Status: DC | PRN

## 2024-01-17 MED ORDER — CEFAZOLIN SODIUM-DEXTROSE 2-3 GM-%(50ML) IV SOLR
INTRAVENOUS | Status: DC | PRN
  Administered 2024-01-17: 2 g via INTRAVENOUS

## 2024-01-17 MED ORDER — KETAMINE HCL 50 MG/5ML IJ SOSY
PREFILLED_SYRINGE | INTRAMUSCULAR | Status: AC
  Filled 2024-01-17: qty 5

## 2024-01-17 MED ORDER — FENTANYL CITRATE (PF) 100 MCG/2ML IJ SOLN
25.0000 ug | INTRAMUSCULAR | Status: DC | PRN
  Administered 2024-01-17: 25 ug via INTRAVENOUS
  Administered 2024-01-17: 50 ug via INTRAVENOUS

## 2024-01-17 MED ORDER — METFORMIN HCL 500 MG PO TABS
500.0000 mg | ORAL_TABLET | Freq: Two times a day (BID) | ORAL | Status: DC
  Administered 2024-01-17: 500 mg via ORAL
  Filled 2024-01-17: qty 1

## 2024-01-17 SURGICAL SUPPLY — 48 items
BAG COUNTER SPONGE SURGICOUNT (BAG) ×2 IMPLANT
BAND RUBBER #18 3X1/16 STRL (MISCELLANEOUS) ×4 IMPLANT
BASKET BONE COLLECTION (BASKET) ×2 IMPLANT
BENZOIN TINCTURE PRP APPL 2/3 (GAUZE/BANDAGES/DRESSINGS) ×2 IMPLANT
BUR CARBIDE MATCH 3.0 (BURR) ×2 IMPLANT
CANISTER SUCTION 3000ML PPV (SUCTIONS) ×2 IMPLANT
DRAIN JP 7F FLT 3/4 PRF SI HBL (DRAIN) IMPLANT
DRAPE C-ARM 42X72 X-RAY (DRAPES) ×4 IMPLANT
DRAPE LAPAROTOMY 100X72 PEDS (DRAPES) ×2 IMPLANT
DRAPE MICROSCOPE SLANT 54X150 (MISCELLANEOUS) IMPLANT
DRSG OPSITE POSTOP 4X6 (GAUZE/BANDAGES/DRESSINGS) IMPLANT
DURAPREP 6ML APPLICATOR 50/CS (WOUND CARE) ×2 IMPLANT
ELECT COATED BLADE 2.86 ST (ELECTRODE) ×2 IMPLANT
ELECTRODE REM PT RTRN 9FT ADLT (ELECTROSURGICAL) ×2 IMPLANT
EVACUATOR SILICONE 100CC (DRAIN) IMPLANT
GAUZE 4X4 16PLY ~~LOC~~+RFID DBL (SPONGE) IMPLANT
GAUZE SPONGE 4X4 12PLY STRL (GAUZE/BANDAGES/DRESSINGS) IMPLANT
GLOVE BIO SURGEON STRL SZ7 (GLOVE) ×2 IMPLANT
GLOVE BIO SURGEON STRL SZ8 (GLOVE) ×2 IMPLANT
GLOVE BIOGEL PI IND STRL 7.0 (GLOVE) ×2 IMPLANT
GOWN STRL REUS W/ TWL LRG LVL3 (GOWN DISPOSABLE) ×2 IMPLANT
GOWN STRL REUS W/ TWL XL LVL3 (GOWN DISPOSABLE) ×2 IMPLANT
GOWN STRL REUS W/TWL 2XL LVL3 (GOWN DISPOSABLE) IMPLANT
HEMOSTAT POWDER KIT SURGIFOAM (HEMOSTASIS) ×2 IMPLANT
KIT BASIN OR (CUSTOM PROCEDURE TRAY) ×2 IMPLANT
KIT TURNOVER KIT B (KITS) ×2 IMPLANT
NDL HYPO 25X1 1.5 SAFETY (NEEDLE) ×2 IMPLANT
NDL SPNL 20GX3.5 QUINCKE YW (NEEDLE) ×2 IMPLANT
NEEDLE HYPO 25X1 1.5 SAFETY (NEEDLE) ×1 IMPLANT
NEEDLE SPNL 20GX3.5 QUINCKE YW (NEEDLE) ×1 IMPLANT
NS IRRIG 1000ML POUR BTL (IV SOLUTION) ×2 IMPLANT
PACK LAMINECTOMY NEURO (CUSTOM PROCEDURE TRAY) ×2 IMPLANT
PAD ARMBOARD POSITIONER FOAM (MISCELLANEOUS) ×2 IMPLANT
PIN DISTRACTION 14MM (PIN) ×4 IMPLANT
PLATE ACP INSIGNIA 78 4L (Plate) IMPLANT
PUTTY BONE 1CC (Putty) IMPLANT
SCREW VA SINGLE LEAD 4X16 (Screw) IMPLANT
SPACER IDENT 7X18X16 10D (Spacer) IMPLANT
SPACER IDENT 7X18X16 7D (Spacer) IMPLANT
SPACER IDENTITI 7X16X14 7D (Spacer) IMPLANT
SPONGE INTESTINAL PEANUT (DISPOSABLE) ×2 IMPLANT
SPONGE SURGIFOAM ABS GEL 100 (HEMOSTASIS) IMPLANT
STRIP CLOSURE SKIN 1/2X4 (GAUZE/BANDAGES/DRESSINGS) ×2 IMPLANT
SUT VIC AB 3-0 SH 8-18 (SUTURE) ×2 IMPLANT
SUT VIC AB 4-0 PS2 18 (SUTURE) IMPLANT
TOWEL GREEN STERILE (TOWEL DISPOSABLE) ×2 IMPLANT
TOWEL GREEN STERILE FF (TOWEL DISPOSABLE) ×2 IMPLANT
WATER STERILE IRR 1000ML POUR (IV SOLUTION) ×2 IMPLANT

## 2024-01-17 NOTE — H&P (Signed)
 Subjective:   Patient is a 68 y.o. male admitted for neck and shoulder pain. The patient first presented to me with complaints of neck pain and shooting pains in the arm(s). Onset of symptoms was several months ago. The pain is described as aching and stabbing and occurs all day. The pain is rated severe, and is located in the neck and radiates to the shoulders. The symptoms have been progressive. Symptoms are exacerbated by extending head backwards, and are relieved by none.  Previous work up includes MRI of cervical spine, results: spinal stenosis.  Past Medical History:  Diagnosis Date   CTS (carpal tunnel syndrome)    Diabetes mellitus without complication (HCC)    type 2   Dyslipidemia    Hearing loss    currently does not wear hearing aids   Hypertension    Peripheral vascular disease (HCC)    Smoker     Past Surgical History:  Procedure Laterality Date   BACK SURGERY     CARPAL TUNNEL RELEASE Left 04/30/2016   Procedure: LEFT CARPAL TUNNEL RELEASE ENDOSCOPIC;  Surgeon: Evalene JONETTA Chancy, MD;  Location: [facility];  Service: Orthopedics;  Laterality: Left;   CARPAL TUNNEL RELEASE Left 01/10/2020   Procedure: LEFT CARPAL TUNNEL RELEASE;  Surgeon: Jerri Kay HERO, MD;  Location: [facility];  Service: Orthopedics;  Laterality: Left;   CARPAL TUNNEL RELEASE Right    COLONOSCOPY  05/04/2010   COLONOSCOPY  08/31/2018   repeat 5-10 years.  Dr. Gwenith Pence, [facility]   SHOULDER ARTHROSCOPY WITH ROTATOR CUFF REPAIR AND SUBACROMIAL DECOMPRESSION Left 01/10/2020   Procedure: LEFT SHOULDER ARTHROSCOPY WITH EXTENSIVE DEBRIDEMENT, SUBACROMIAL DECOMPRESSION, DISTAL CLAVICLE EXCISION, MANIPULATION UNDER ANESTHESIA;  Surgeon: Jerri Kay HERO, MD;  Location: [facility];  Service: Orthopedics;  Laterality: Left;    No Known Allergies  Social History   Tobacco Use   Smoking status: Former    Current packs/day: 0.30    Average packs/day: 0.3  packs/day for 40.0 years (12.0 ttl pk-yrs)    Types: Cigarettes   Smokeless tobacco: Never   Tobacco comments:    Smokes 1/2 ppd  He is ready to quit. Tried gum, patches- nothing helped.  Recently started taking Chantix  as of 12/07/23.  Substance Use Topics   Alcohol use: Not Currently    Comment: socially    History reviewed. No pertinent family history. Prior to Admission medications   Medication Sig Start Date End Date Taking? Authorizing Provider  celecoxib  (CELEBREX ) 200 MG capsule Take 200 mg by mouth 2 (two) times daily as needed for moderate pain (pain score 4-6). 01/06/24  Yes [provider]  losartan  (COZAAR ) 25 MG tablet Take 1 tablet (25 mg total) by mouth daily. 12/10/23  Yes Patwardhan, Newman PARAS, MD  metFORMIN  (GLUCOPHAGE ) 500 MG tablet Take 1 tablet (500 mg total) by mouth 2 (two) times daily with a meal. 01/07/24  Yes Tysinger, Alm RAMAN, PA-C  oxyCODONE  (ROXICODONE ) 5 MG immediate release tablet Take 1 tablet (5 mg total) by mouth every 6 (six) hours as needed for severe pain (pain score 7-10). 09/23/23  Yes Tysinger, Alm RAMAN, PA-C  rosuvastatin  (CRESTOR ) 20 MG tablet Take 20 mg by mouth daily.   Yes [provider]  varenicline  (CHANTIX  CONTINUING MONTH PAK) 1 MG tablet Take 1 tablet (1 mg total) by mouth 2 (two) times daily. 10/20/23  Yes Randol Dawes, MD     Review of Systems  Positive ROS: neg  All other  systems have been reviewed and were otherwise negative with the exception of those mentioned in the HPI and as above.  Objective: Vital signs in last 24 hours: Temp:  [98.4 F (36.9 C)] 98.4 F (36.9 C) (09/15 0926) Pulse Rate:  [100] 100 (09/15 0926) Resp:  [20] 20 (09/15 0926) BP: (139)/(86) 139/86 (09/15 0926) SpO2:  [96 %] 96 % (09/15 0926) Weight:  [86.2 kg] 86.2 kg (09/15 0926)  General Appearance: Alert, cooperative, no distress, appears stated age Head: Normocephalic, without obvious abnormality, atraumatic Eyes: PERRL, conjunctiva/corneas  clear, EOM's intact      Neck: Supple, symmetrical, trachea midline, Back: Symmetric, no curvature, ROM normal, no CVA tenderness Lungs:  respirations unlabored Heart: Regular rate and rhythm Abdomen: Soft, non-tender Extremities: Extremities normal, atraumatic, no cyanosis or edema Pulses: 2+ and symmetric all extremities Skin: Skin color, texture, turgor normal, no rashes or lesions  NEUROLOGIC:  Mental status: Alert and oriented x4, no aphasia, good attention span, fund of knowledge and memory  Motor Exam - grossly normal Sensory Exam - grossly normal Reflexes: 1+ Coordination - grossly normal Gait - grossly normal Balance - grossly normal Cranial Nerves: I: smell Not tested  II: visual acuity  OS: nl    OD: nl  II: visual fields Full to confrontation  II: pupils Equal, round, reactive to light  III,VII: ptosis None  III,IV,VI: extraocular muscles  Full ROM  V: mastication Normal  V: facial light touch sensation  Normal  V,VII: corneal reflex  Present  VII: facial muscle function - upper  Normal  VII: facial muscle function - lower Normal  VIII: hearing Not tested  IX: soft palate elevation  Normal  IX,X: gag reflex Present  XI: trapezius strength  5/5  XI: sternocleidomastoid strength 5/5  XI: neck flexion strength  5/5  XII: tongue strength  Normal    Data Review Lab Results  Component Value Date   WBC 3.9 (L) 01/13/2024   HGB 13.6 01/13/2024   HCT 41.2 01/13/2024   MCV 90.0 01/13/2024   PLT 122 (L) 01/13/2024   Lab Results  Component Value Date   NA 137 01/13/2024   K 4.3 01/13/2024   CL 100 01/13/2024   CO2 26 01/13/2024   BUN 11 01/13/2024   CREATININE 0.85 01/13/2024   GLUCOSE 142 (H) 01/13/2024   Lab Results  Component Value Date   INR 1.0 10/03/2006    Assessment:   Cervical neck pain with herniated nucleus pulposus/ spondylosis/ stenosis at C3-4 to C6-7. Estimated body mass index is 27.26 kg/m as calculated from the following:   Height  as of this encounter: 5' 10 (1.778 m).   Weight as of this encounter: 86.2 kg.  Patient has failed conservative therapy. Planned surgery : ACDF with plate R6-5 R5-4 C5-6 C6-7  Plan:   I explained the condition and procedure to the patient and answered any questions.  Patient wishes to proceed with procedure as planned. Understands risks/ benefits/ and expected or typical outcomes.  Alm GORMAN Molt 01/17/2024 11:38 AM

## 2024-01-17 NOTE — Transfer of Care (Signed)
 Immediate Anesthesia Transfer of Care Note  Patient: Oscar Myers  Procedure(s) Performed: ANTERIOR CERVICAL DECOMPRESSION/DISCECTOMY FUSION CERVICAL THREE-FOUR CERVICAL FOUR-FIVE CERVICAL FIVE-SIX CERVICAL SIX-SEVEN  Patient Location: PACU  Anesthesia Type:General  Level of Consciousness: drowsy  Airway & Oxygen Therapy: Patient Spontanous Breathing and Patient connected to face mask oxygen  Post-op Assessment: Report given to RN and Post -op Vital signs reviewed and stable  Post vital signs: Reviewed and stable  Last Vitals:  Vitals Value Taken Time  BP 148/93 01/17/24 15:15  Temp    Pulse 99 01/17/24 15:19  Resp 21 01/17/24 15:19  SpO2 96 % 01/17/24 15:19  Vitals shown include unfiled device data.  Last Pain:  Vitals:   01/17/24 0937  TempSrc:   PainSc: 7       Patients Stated Pain Goal: 0 (01/17/24 0937)  Complications: No notable events documented.

## 2024-01-17 NOTE — Anesthesia Procedure Notes (Signed)
 Procedure Name: Intubation Date/Time: 01/17/2024 12:08 PM  Performed by: Beretta Ginsberg J, CRNAPre-anesthesia Checklist: Patient identified, Emergency Drugs available, Suction available and Patient being monitored Patient Re-evaluated:Patient Re-evaluated prior to induction Oxygen Delivery Method: Circle System Utilized Preoxygenation: Pre-oxygenation with 100% oxygen Induction Type: IV induction Ventilation: Mask ventilation without difficulty Laryngoscope Size: Glidescope and 4 Grade View: Grade I Tube type: Oral Tube size: 7.5 mm Number of attempts: 1 Airway Equipment and Method: Stylet and Oral airway Placement Confirmation: ETT inserted through vocal cords under direct vision, positive ETCO2 and breath sounds checked- equal and bilateral Secured at: 23 cm Tube secured with: Tape Dental Injury: Teeth and Oropharynx as per pre-operative assessment

## 2024-01-17 NOTE — Op Note (Signed)
 01/17/2024  3:03 PM  PATIENT:  Oscar Myers  68 y.o. male  PRE-OPERATIVE DIAGNOSIS:  cervical spondylosis with stenosis C3-4 C4-5 C5-6 C6-7, neck and arm pain  POST-OPERATIVE DIAGNOSIS:  same  PROCEDURE:  1. Decompressive anterior cervical discectomy C3-4 C4-5 C5-6 C6-7, 2. Anterior cervical arthrodesis C3-4 C4-5 C5-6 C6-7 utilizing a PTI interbody cage packed with locally harvested morcellized autologous bone graft and DBM, 3. Anterior cervical plating C3-C7 inclusive utilizing a ATEC plate  SURGEON:  Alm Molt, MD  ASSISTANTS: Meyran FNP  ANESTHESIA:   General  EBL: 100 ml  Total I/O In: 800 [I.V.:800] Out: 100 [Blood:100]  BLOOD ADMINISTERED: none  DRAINS: 7 flat JP  SPECIMEN:  none  INDICATION FOR PROCEDURE: This patient presented with neck pain and arm pain. Imaging showed stenosis. The patient tried conservative measures without relief. Pain was debilitating. Recommended ACDF with plating. Patient understood the risks, benefits, and alternatives and potential outcomes and wished to proceed.  PROCEDURE DETAILS: Patient was brought to the operating room placed under general endotracheal anesthesia. Patient was placed in the supine position on the operating room table. The neck was prepped with Duraprep and draped in a sterile fashion.   Three cc of local anesthesia was injected and a transverse incision was made on the right side of the neck.  Dissection was carried down thru the subcutaneous tissue and the platysma was  elevated, opened, and undermined with Metzenbaum scissors.  Dissection was then carried out thru an avascular plane leaving the sternocleidomastoid carotid artery and jugular vein laterally and the trachea and esophagus medially with the assistance of my nurse practitioner. The ventral aspect of the vertebral column was identified and a localizing x-ray was taken. The C5-6 level was identified and all in the room agreed with the level. The longus colli  muscles were then elevated and the retractor was placed with the assistance of my nurse practitioner to expose C3-4 to C6-7. The annulus was incised and the disc space entered at all 4 levels the same way. Discectomy was performed with micro-curettes and pituitary rongeurs. I then used the high-speed drill to drill the endplates down to the level of the posterior longitudinal ligament. The drill shavings were saved in a mucous trap for later arthrodesis. The operating microscope was draped and brought into the field provided additional magnification, illumination and visualization. Discectomy was continued posteriorly thru the disc space. Posterior longitudinal ligament was opened with a nerve hook, and then removed along with disc herniation and osteophytes, decompressing the spinal canal and thecal sac. We then continued to remove osteophytic overgrowth and disc material decompressing the neural foramina and exiting nerve roots bilaterally. The scope was angled up and down to help decompress and undercut the vertebral bodies. Once the decompression was completed we could pass a nerve hook circumferentially to assure adequate decompression in the midline and in the neural foramina. So by both visualization and palpation we felt we had an adequate decompression of the neural elements. We then measured the height of the intravertebral disc space and selected a 7 millimeter PTI interbody cage packed with autograft and DBM. It was then gently positioned in the intravertebral disc space(s) and countersunk. I then used a 78 mm ATEC plate and placed variable angle screws into the vertebral bodies of each level and locked them into position. The wound was irrigated with bacitracin solution, checked for hemostasis which was established and confirmed. Once meticulous hemostasis was achieved, we placed a 7 flat JP then proceeded  with closure with the assistance of my nurse practitioner. The platysma was closed with  interrupted 3-0 undyed Vicryl suture, the subcuticular layer was closed with interrupted 3-0 undyed Vicryl suture. The skin edges were approximated with steristrips. The drapes were removed. A sterile dressing was applied. The patient was then awakened from general anesthesia and transferred to the recovery room in stable condition. At the end of the procedure all sponge, needle and instrument counts were correct.   PLAN OF CARE: Admit to inpatient   PATIENT DISPOSITION:  PACU - hemodynamically stable.   Delay start of Pharmacological VTE agent (>24hrs) due to surgical blood loss or risk of bleeding:  yes

## 2024-01-18 LAB — GLUCOSE, CAPILLARY: Glucose-Capillary: 177 mg/dL — ABNORMAL HIGH (ref 70–99)

## 2024-01-18 MED ORDER — OXYCODONE-ACETAMINOPHEN 5-325 MG PO TABS: 1.0000 | ORAL_TABLET | ORAL | 0 refills | Status: DC | PRN

## 2024-01-18 MED ORDER — METHOCARBAMOL 500 MG PO TABS: 500.0000 mg | ORAL_TABLET | Freq: Four times a day (QID) | ORAL | 0 refills | Status: DC | PRN

## 2024-01-18 MED FILL — Thrombin For Soln 5000 Unit: CUTANEOUS | Qty: 5000 | Status: AC

## 2024-01-18 NOTE — Evaluation (Signed)
 Occupational Therapy Evaluation Patient Details Name: Oscar Myers MRN: [id number] DOB: 05/26/1955 Today's Date: 01/18/2024   History of Present Illness   68 yo male s/p 9/15 ACDF C3-7 PMH CTS, DM, hearing loss, HTN, PVD, smoker back surg L TSA     Clinical Impressions Patient evaluated by Occupational Therapy with no further acute OT needs identified. All education has been completed and the patient has no further questions. See below for any follow-up Occupational Therapy or equipment needs. OT to sign off. Thank you for referral.       If plan is discharge home, recommend the following:   Assist for transportation     Functional Status Assessment   Patient has had a recent decline in their functional status and demonstrates the ability to make significant improvements in function in a reasonable and predictable amount of time.     Equipment Recommendations   None recommended by OT     Recommendations for Other Services         Precautions/Restrictions   Precautions Precautions: Cervical Precaution Booklet Issued: Yes (comment) Recall of Precautions/Restrictions: Intact     Mobility Bed Mobility Overal bed mobility: Independent                  Transfers Overall transfer level: Independent                        Balance Overall balance assessment: Independent                                         ADL either performed or assessed with clinical judgement   ADL Overall ADL's : Modified independent                                       General ADL Comments: met OT at the door and answered it. pt eager to d/c home. Pt with ride at the front of hospital by the end of session.  Cervical precautions ( handout provided): educated on oral care using cups, washing face with cloth, never to wash directly on incision site, avoid neck rotation flexion and extension, positioning with pillows in chair for  bil UE, sleeping positioning, avoiding pushing / pulling with bil UE. Pt educated on need to notify doctor / RN of swallowing changes or choking..     Vision Baseline Vision/History: 1 Wears glasses Ability to See in Adequate Light: 0 Adequate Patient Visual Report: No change from baseline Vision Assessment?: No apparent visual deficits     Perception         Praxis         Pertinent Vitals/Pain Pain Assessment Pain Assessment: No/denies pain     Extremity/Trunk Assessment Upper Extremity Assessment Upper Extremity Assessment: Overall WFL for tasks assessed   Lower Extremity Assessment Lower Extremity Assessment: Overall WFL for tasks assessed   Cervical / Trunk Assessment Cervical / Trunk Assessment: Neck Surgery   Communication     Cognition Arousal: Alert Behavior During Therapy: WFL for tasks assessed/performed Cognition: No apparent impairments                               Following commands: Intact       Cueing  General Comments      incision dry and intact   Exercises     Shoulder Instructions      Home Living Family/patient expects to be discharged to:: Private residence Living Arrangements: Spouse/significant other Available Help at Discharge: Family;Available PRN/intermittently Type of Home: House Home Access: Stairs to enter [employer] of Steps: 2 Entrance Stairs-Rails: None Home Layout: One level     Bathroom Shower/Tub: Chief Strategy Officer: Standard     Home Equipment: None   Additional Comments: dog named bella      Prior Functioning/Environment Prior Level of Function : Independent/Modified Independent;Working/employed;Driving (works as Investment banker, operational)                    OT Problem List:     OT Treatment/Interventions:        OT Goals(Current goals can be found in the care plan section)   Acute Rehab OT Goals Patient Stated Goal: to go home today Potential to Achieve Goals:  Good   OT Frequency:       Co-evaluation              AM-PAC OT 6 Clicks Daily Activity     Outcome Measure Help from another person eating meals?: None Help from another person taking care of personal grooming?: None Help from another person toileting, which includes using toliet, bedpan, or urinal?: None Help from another person bathing (including washing, rinsing, drying)?: None Help from another person to put on and taking off regular upper body clothing?: None Help from another person to put on and taking off regular lower body clothing?: None 6 Click Score: 24   End of Session Nurse Communication: Mobility status;Precautions  Activity Tolerance: Patient tolerated treatment well Patient left: Other (comment) (up in the hall at the nurses station)  OT Visit Diagnosis: Unsteadiness on feet (R26.81)                Time: 9151-9099 OT Time Calculation (min): 12 min Charges:  OT General Charges $OT Visit: 1 Visit OT Evaluation $OT Eval Low Complexity: 1 Low   Brynn, OTR/L  Acute Rehabilitation Services Office: [phone] .   Ely Molt 01/18/2024, 9:55 AM

## 2024-01-18 NOTE — Discharge Summary (Signed)
 Physician Discharge Summary  Patient ID: Oscar Myers MRN: [id number] DOB/AGE: 1955-11-19 68 y.o.  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 The patient tolerated the procedure well and was taken to the recovery room and then to the floor in stable condition. The hospital course was routine. There were no complications. The wound remained clean dry and intact. Pt had appropriate neck soreness. No complaints of arm pain or new N/T/W. The patient remained afebrile with stable vital signs, and tolerated a regular diet. The patient continued to increase activities, and pain was well controlled with oral pain medications.   Consults: None  Significant Diagnostic Studies:  Results for orders placed or performed during the hospital encounter of 01/17/24  Glucose, capillary   Collection Time: 01/17/24  9:28 AM  Result Value Ref Range   Glucose-Capillary 138 (H) 70 - 99 mg/dL  Glucose, capillary   Collection Time: 01/17/24 11:37 AM  Result Value Ref Range   Glucose-Capillary 125 (H) 70 - 99 mg/dL  Glucose, capillary   Collection Time: 01/17/24  3:16 PM  Result Value Ref Range   Glucose-Capillary 158 (H) 70 - 99 mg/dL  Glucose, capillary   Collection Time: 01/17/24  6:11 PM  Result Value Ref Range   Glucose-Capillary 186 (H) 70 - 99 mg/dL  Glucose, capillary   Collection Time: 01/17/24  8:58 PM  Result Value Ref Range   Glucose-Capillary 237 (H) 70 - 99 mg/dL   Comment 1 Notify RN    Comment 2 Document in Chart   Glucose, capillary   Collection Time: 01/18/24  6:34 AM  Result Value Ref Range   Glucose-Capillary 177 (H) 70 - 99 mg/dL   Comment 1 Notify RN    Comment 2  Document in Chart     DG Cervical Spine 2 or 3 views Result Date: 01/17/2024 CLINICAL DATA:  Elective surgery. EXAM: CERVICAL SPINE - 2-3 VIEW COMPARISON:  Preoperative imaging FINDINGS: Four fluoroscopic spot views of the cervical spine submitted from the operating room. Anterior fusion C3 through C7 with interbody spacers. Fluoroscopy time 19 seconds. Dose 1.43 mGy. IMPRESSION: Intraoperative fluoroscopy during cervical fusion. Electronically Signed   By: Andrea Gasman M.D.   On: 01/17/2024 18:33   DG C-Arm 1-60 Min-No Report Result Date: 01/17/2024 Fluoroscopy was utilized by the requesting physician.  No radiographic interpretation.   DG C-Arm 1-60 Min-No Report Result Date: 01/17/2024 Fluoroscopy was utilized by the requesting physician.  No radiographic interpretation.   DG C-Arm 1-60 Min-No Report Result Date: 01/17/2024 Fluoroscopy was utilized by the requesting physician.  No radiographic interpretation.   ECHOCARDIOGRAM COMPLETE Result Date: 12/21/2023    ECHOCARDIOGRAM REPORT   Patient Name:   Oscar Myers Date of Exam: 12/21/2023 Medical Rec #:  [id number]        Height:       70.0 in Accession #:    [id number]       Weight:       190.0 lb Date of Birth:  [birth year]        BSA:          2.042 m Patient Age:    68 years         BP:  132/82 mmHg Patient Gender: M                HR:           97 bpm. Exam Location:  [address] Procedure: 2D Echo, Cardiac Doppler, Color Doppler and 3D Echo (Both Spectral            and Color Flow Doppler were utilized during procedure). Indications:    Z01.818 Encounter for other preprocedural examination; I10                 Hypertension  History:        Patient has no prior history of Echocardiogram examinations.                 Risk Factors:Dyslipidemia and Diabetes.  Sonographer:    Augustin Seals RDCS Referring Phys: [id number] [facility] J PATWARDHAN  Sonographer Comments: Global longitudinal strain was attempted. IMPRESSIONS  1. Left  ventricular ejection fraction, by estimation, is 50 to 55%. Left ventricular ejection fraction by 3D volume is 50 %. The left ventricle has low normal function. The left ventricle demonstrates global hypokinesis. Left ventricular diastolic parameters are consistent with Grade I diastolic dysfunction (impaired relaxation).  2. Right ventricular systolic function is normal. The right ventricular size is normal. Tricuspid regurgitation signal is inadequate for assessing PA pressure.  3. The mitral valve is normal in structure. No evidence of mitral valve regurgitation. No evidence of mitral stenosis.  4. The aortic valve is tricuspid. Aortic valve regurgitation is not visualized. Aortic valve sclerosis/calcification is present, without any evidence of aortic stenosis.  5. The inferior vena cava is normal in size with greater than 50% respiratory variability, suggesting right atrial pressure of 3 mmHg. FINDINGS  Left Ventricle: Left ventricular ejection fraction, by estimation, is 50 to 55%. Left ventricular ejection fraction by 3D volume is 50 %. The left ventricle has low normal function. The left ventricle demonstrates global hypokinesis. The left ventricular internal cavity size was normal in size. There is no left ventricular hypertrophy. Left ventricular diastolic parameters are consistent with Grade I diastolic dysfunction (impaired relaxation). Normal left ventricular filling pressure. Right Ventricle: The right ventricular size is normal. No increase in right ventricular wall thickness. Right ventricular systolic function is normal. Tricuspid regurgitation signal is inadequate for assessing PA pressure. Left Atrium: Left atrial size was normal in size. Right Atrium: Right atrial size was normal in size. Pericardium: There is no evidence of pericardial effusion. Mitral Valve: The mitral valve is normal in structure. No evidence of mitral valve regurgitation. No evidence of mitral valve stenosis. Tricuspid Valve:  The tricuspid valve is normal in structure. Tricuspid valve regurgitation is not demonstrated. No evidence of tricuspid stenosis. Aortic Valve: The aortic valve is tricuspid. Aortic valve regurgitation is not visualized. Aortic valve sclerosis/calcification is present, without any evidence of aortic stenosis. Pulmonic Valve: The pulmonic valve was normal in structure. Pulmonic valve regurgitation is mild. No evidence of pulmonic stenosis. Aorta: The aortic root is normal in size and structure. Venous: The inferior vena cava is normal in size with greater than 50% respiratory variability, suggesting right atrial pressure of 3 mmHg. IAS/Shunts: No atrial level shunt detected by color flow Doppler. Additional Comments: 3D was performed not requiring image post processing on an independent workstation and was abnormal.  LEFT VENTRICLE PLAX 2D LVIDd:         4.90 cm         Diastology LVIDs:  3.60 cm         LV e' medial:    7.40 cm/s LV PW:         0.90 cm         LV E/e' medial:  8.1 LV IVS:        0.90 cm         LV e' lateral:   10.40 cm/s LVOT diam:     2.40 cm         LV E/e' lateral: 5.8 LV SV:         73 LV SV Index:   36 LVOT Area:     4.52 cm        3D Volume EF                                LV 3D EF:    Left                                             ventricul                                             ar                                             ejection                                             fraction                                             by 3D                                             volume is                                             50 %.                                 3D Volume EF:                                3D EF:        50 %                                LV EDV:       130 ml  LV ESV:       64 ml                                LV SV:        65 ml RIGHT VENTRICLE             IVC RV Basal diam:  3.60 cm     IVC diam: 1.00 cm RV Mid diam:     3.10 cm RV S prime:     13.10 cm/s TAPSE (M-mode): 2.2 cm LEFT ATRIUM             Index        RIGHT ATRIUM           Index LA diam:        2.80 cm 1.37 cm/m   RA Area:     14.30 cm LA Vol (A2C):   39.9 ml 19.54 ml/m  RA Volume:   32.10 ml  15.72 ml/m LA Vol (A4C):   23.9 ml 11.70 ml/m LA Biplane Vol: 33.7 ml 16.50 ml/m  AORTIC VALVE LVOT Vmax:   95.40 cm/s LVOT Vmean:  61.300 cm/s LVOT VTI:    0.161 m  AORTA Ao Root diam: 3.50 cm Ao Asc diam:  3.20 cm MITRAL VALVE MV Area (PHT): 4.68 cm     SHUNTS MV Decel Time: 162 msec     Systemic VTI:  0.16 m MV E velocity: 60.30 cm/s   Systemic Diam: 2.40 cm MV A velocity: 102.00 cm/s MV E/A ratio:  0.59 Wilbert Bihari MD Electronically signed by Wilbert Bihari MD Signature Date/Time: 12/21/2023/1:24:10 PM    Final     Antibiotics:  Anti-infectives (From admission, onward)    Start     Dose/Rate Route Frequency Ordered Stop   01/17/24 1800  ceFAZolin  (ANCEF ) IVPB 2g/100 mL premix        2 g 200 mL/hr over 30 Minutes Intravenous Every 8 hours 01/17/24 1700 01/18/24 0206       Discharge Exam: Blood pressure (!) 164/90, pulse 85, temperature 97.9 F (36.6 C), temperature source Oral, resp. rate 18, height 5' 10 (1.778 m), weight 86.2 kg, SpO2 97%. Neurologic: Grossly normal Ambulating and voiding well incision cdi   Discharge Medications:   Allergies as of 01/18/2024   No Known Allergies      Medication List     STOP taking these medications    oxyCODONE  5 MG immediate release tablet Commonly known as: Roxicodone        TAKE these medications    CeleBREX  200 MG capsule Generic drug: celecoxib  Take 200 mg by mouth 2 (two) times daily as needed for moderate pain (pain score 4-6).   losartan  25 MG tablet Commonly known as: COZAAR  Take 1 tablet (25 mg total) by mouth daily.   metFORMIN  500 MG tablet Commonly known as: GLUCOPHAGE  Take 1 tablet (500 mg total) by mouth 2 (two) times daily with a meal.   methocarbamol  500 MG  tablet Commonly known as: ROBAXIN  Take 1 tablet (500 mg total) by mouth every 6 (six) hours as needed for muscle spasms.   oxyCODONE -acetaminophen  5-325 MG tablet Commonly known as: Percocet Take 1 tablet by mouth every 4 (four) hours as needed for severe pain (pain score 7-10).   rosuvastatin  20 MG tablet Commonly known as: CRESTOR  Take 20 mg by mouth daily.   varenicline  1 MG tablet Commonly known as: Chantix  Continuing Month  Pak Take 1 tablet (1 mg total) by mouth 2 (two) times daily.        Disposition: home   Final Dx: ACDF C3-4, C4-5, C5-6, C6-7  Discharge Instructions      Remove dressing in 72 hours   Complete by: As directed    Call MD for:   Complete by: As directed    Call MD for:  difficulty breathing, headache or visual disturbances   Complete by: As directed    Call MD for:  hives   Complete by: As directed    Call MD for:  persistant dizziness or light-headedness   Complete by: As directed    Call MD for:  persistant nausea and vomiting   Complete by: As directed    Call MD for:  redness, tenderness, or signs of infection (pain, swelling, redness, odor or green/yellow discharge around incision site)   Complete by: As directed    Call MD for:  severe uncontrolled pain   Complete by: As directed    Call MD for:  temperature >100.4   Complete by: As directed    Diet - low sodium heart healthy   Complete by: As directed    Driving Restrictions   Complete by: As directed    No driving for 2 weeks, no riding in the car for 1 week   Increase activity slowly   Complete by: As directed    Lifting restrictions   Complete by: As directed    No lifting more than 8 lbs          Signed: Suzen Lacks Jaylon Boylen 01/18/2024, 7:38 AM

## 2024-01-18 NOTE — Plan of Care (Signed)
 Pt doing well. Pt given D/C instructions with verbal understanding. Rx's were sent to the pharmacy by MD. Pt's incision is clean and dry with no sign of infection. Pt's IV was removed prior to D/C. Pt D/C'd home via wheelchair per MD order. Pt is stable @ D/C and has no other needs at this time. Rema Fendt, RN

## 2024-01-18 NOTE — Anesthesia Postprocedure Evaluation (Signed)
 Anesthesia Post Note  Patient: TOBE KERVIN  Procedure(s) Performed: ANTERIOR CERVICAL DECOMPRESSION/DISCECTOMY FUSION CERVICAL THREE-FOUR CERVICAL FOUR-FIVE CERVICAL FIVE-SIX CERVICAL SIX-SEVEN     Patient location during evaluation: PACU Anesthesia Type: General Level of consciousness: awake and alert Pain management: pain level controlled Vital Signs Assessment: post-procedure vital signs reviewed and stable Respiratory status: spontaneous breathing, nonlabored ventilation, respiratory function stable and patient connected to nasal cannula oxygen Cardiovascular status: blood pressure returned to baseline and stable Postop Assessment: no apparent nausea or vomiting Anesthetic complications: no   No notable events documented.  Last Vitals:  Vitals:   01/18/24 0523 01/18/24 0739  BP: (!) 164/90 (!) 145/90  Pulse: 85 96  Resp: 18 18  Temp: 36.6 C 36.6 C  SpO2: 97% 100%    Last Pain:  Vitals:   01/18/24 0739  TempSrc: Oral  PainSc:                  Cordella SQUIBB Arica B[place]ac[place]

## 2024-01-19 ENCOUNTER — Telehealth: Payer: Self-pay | Admitting: *Deleted

## 2024-01-19 ENCOUNTER — Encounter (HOSPITAL_COMMUNITY): Payer: Self-pay | Admitting: Neurological Surgery

## 2024-01-19 NOTE — Transitions of Care (Post Inpatient/ED Visit) (Signed)
 01/19/2024  Name: Oscar Myers MRN: [id number] DOB: June 05, 1955  Today's TOC FU Call Status: Today's TOC FU Call Status:: Successful TOC FU Call Completed TOC FU Call Complete Date: 01/19/24 Patient's Name and Date of Birth confirmed.  Transition Care Management Follow-up Telephone Call Date of Discharge: 01/18/24 Discharge Facility: [facility]) Type of Discharge: Inpatient Admission Primary Inpatient Discharge Diagnosis:: S/P cervical spinal fusion How have you been since you were released from the hospital?: Better Any questions or concerns?: No  Items Reviewed: Did you receive and understand the discharge instructions provided?: Yes Medications obtained,verified, and reconciled?: Yes (Medications Reviewed) Any new allergies since your discharge?: No Dietary orders reviewed?: No Do you have support at home?: Yes People in Home [RPT]: spouse Name of Support/Comfort Primary Source: Debra  Medications Reviewed Today: Medications Reviewed Today     Reviewed by Kennieth Cathlean DEL, RN (Case Manager) on 01/19/24 at 1228  Med List Status: <None>   Medication Order Taking? Sig Documenting Provider Last Dose Status Informant  celecoxib  (CELEBREX ) 200 MG capsule [id number] Yes Take 200 mg by mouth 2 (two) times daily as needed for moderate pain (pain score 4-6). [provider]  Active Self  losartan  (COZAAR ) 25 MG tablet [id number] Yes Take 1 tablet (25 mg total) by mouth daily. Elmira Newman PARAS, MD  Active Self  metFORMIN  (GLUCOPHAGE ) 500 MG tablet [id number] Yes Take 1 tablet (500 mg total) by mouth 2 (two) times daily with a meal. Tysinger, Alm RAMAN, PA-C  Active Self  methocarbamol  (ROBAXIN ) 500 MG tablet [id number] Yes Take 1 tablet (500 mg total) by mouth every 6 (six) hours as needed for muscle spasms. Meyran, Suzen Lacks, NP  Active   oxyCODONE -acetaminophen  (PERCOCET) 5-325 MG tablet [id number] Yes Take 1 tablet by mouth every 4 (four) hours as needed for  severe pain (pain score 7-10). Meyran, Kimberly Hannah, NP  Active   rosuvastatin  (CRESTOR ) 20 MG tablet [id number] Yes Take 20 mg by mouth daily. [provider]  Active Self  varenicline  (CHANTIX  CONTINUING MONTH PAK) 1 MG tablet [id number] Yes Take 1 tablet (1 mg total) by mouth 2 (two) times daily. Randol Dawes, MD  Active Self            Home Care and Equipment/Supplies: Were Home Health Services Ordered?: NA Any new equipment or medical supplies ordered?: NA  Functional Questionnaire: Do you need assistance with bathing/showering or dressing?: No Do you need assistance with meal preparation?: Yes Do you need assistance with eating?: No Do you have difficulty maintaining continence: No Do you need assistance with getting out of bed/getting out of a chair/moving?: No Do you have difficulty managing or taking your medications?: No  Follow up appointments reviewed: PCP Follow-up appointment confirmed?: No MD Provider Line Number:[phone] Given: Yes (Patient will call for appointment) Specialist Hospital Follow-up appointment confirmed?: Yes Date of Specialist follow-up appointment?: 02/01/24 Follow-Up Specialty Provider:: Dr Joshua neurosurgery Do you need transportation to your follow-up appointment?: No Do you understand care options if your condition(s) worsen?: Yes-patient verbalized understanding  SDOH Interventions Today    Flowsheet Row Most Recent Value  SDOH Interventions   Food Insecurity Interventions Intervention Not Indicated  Housing Interventions Intervention Not Indicated  Transportation Interventions Intervention Not Indicated, Patient Resources (Friends/Family)  Utilities Interventions Intervention Not Indicated   Discussed and offered 30 day TOC program.  Patient declined.  The patient has been provided with contact information for the care management team and has been advised to call with any health -  related questions or concerns.  The patient  verbalized understanding with current plan of care.  The patient is directed to their insurance card regarding availability of benefits coverage Cathlean Headland BSN RN [facility] Care Management Coordinator [email or web address] Direct Dial: [phone]  Fax: [phone] Website: [email or web address]

## 2024-01-20 ENCOUNTER — Ambulatory Visit: Payer: Self-pay | Admitting: Medical

## 2024-01-20 NOTE — Progress Notes (Signed)
 Abstract result

## 2024-02-28 NOTE — Progress Notes (Signed)
 LEIB ELAHI                                          MRN: [id number]   02/28/2024   The [facility] Quality Team Specialist reviewed this patient medical record for the purposes of chart review for care gap closure. The following were reviewed: abstraction for care gap closure-kidney health evaluation for diabetes:eGFR  and uACR.    [facility]I Quality Team

## 2024-02-29 DIAGNOSIS — M5412 Radiculopathy, cervical region: Secondary | ICD-10-CM | POA: Diagnosis not present

## 2024-03-20 ENCOUNTER — Other Ambulatory Visit: Payer: Self-pay | Admitting: Medical

## 2024-03-20 DIAGNOSIS — R1310 Dysphagia, unspecified: Secondary | ICD-10-CM

## 2024-03-20 DIAGNOSIS — Z09 Encounter for follow-up examination after completed treatment for conditions other than malignant neoplasm: Secondary | ICD-10-CM

## 2024-03-20 DIAGNOSIS — R490 Dysphonia: Secondary | ICD-10-CM

## 2024-04-16 ENCOUNTER — Encounter (HOSPITAL_BASED_OUTPATIENT_CLINIC_OR_DEPARTMENT_OTHER): Payer: Self-pay

## 2024-04-16 ENCOUNTER — Emergency Department (HOSPITAL_BASED_OUTPATIENT_CLINIC_OR_DEPARTMENT_OTHER)

## 2024-04-16 ENCOUNTER — Other Ambulatory Visit: Payer: Self-pay

## 2024-04-16 ENCOUNTER — Emergency Department (HOSPITAL_BASED_OUTPATIENT_CLINIC_OR_DEPARTMENT_OTHER)
Admission: EM | Admit: 2024-04-16 | Discharge: 2024-04-16 | Disposition: A | Discharge status: Home / Self Care | Attending: Emergency Medicine | Admitting: Emergency Medicine

## 2024-04-16 DIAGNOSIS — M25531 Pain in right wrist: Secondary | ICD-10-CM | POA: Insufficient documentation

## 2024-04-16 MED ORDER — METHYLPREDNISOLONE 4 MG PO TBPK: ORAL_TABLET | ORAL | 0 refills | Status: DC

## 2024-04-16 MED ORDER — OXYCODONE HCL 5 MG PO TABS
5.0000 mg | ORAL_TABLET | Freq: Once | ORAL | Status: AC
  Administered 2024-04-16: 5 mg via ORAL
  Filled 2024-04-16: qty 1

## 2024-04-16 MED ORDER — KETOROLAC TROMETHAMINE 15 MG/ML IJ SOLN
15.0000 mg | Freq: Once | INTRAMUSCULAR | Status: AC
  Administered 2024-04-16: 15 mg via INTRAMUSCULAR
  Filled 2024-04-16: qty 1

## 2024-04-16 MED ORDER — DICLOFENAC SODIUM 1 % EX GEL: 4.0000 g | Freq: Four times a day (QID) | CUTANEOUS | 0 refills | Status: DC

## 2024-04-16 MED ORDER — ACETAMINOPHEN 500 MG PO TABS
1000.0000 mg | ORAL_TABLET | Freq: Once | ORAL | Status: AC
  Administered 2024-04-16: 1000 mg via ORAL
  Filled 2024-04-16: qty 2

## 2024-04-16 NOTE — Discharge Instructions (Signed)
 Please follow-up with your family doctor in the office.  I have given you information to follow-up with a hand doctor in clinic.  Please call them tomorrow to set up an appointment.  Take the steroids as prescribed.  Use the gel as prescribed. Also take tylenol  1000mg (2 extra strength) four times a day.

## 2024-04-16 NOTE — ED Provider Notes (Signed)
 [place] EMERGENCY DEPARTMENT AT [facility] Provider Note   CSN: [id number] Arrival date & time: 04/16/24  1015     Patient presents with: Joint Swelling (R wrist swelling and pain - some tingling in R hand)   Oscar Myers is a 68 y.o. male.   41 y M with a chief complaint of right wrist pain.  Tells me this has been off and on for some time.  He denies injury to the area.  Worse over the past couple days.  No fevers.        Prior to Admission medications  Medication Sig Start Date End Date Taking? Authorizing Provider  diclofenac  Sodium (VOLTAREN ) 1 % GEL Apply 4 g topically 4 (four) times daily. 04/16/24  Yes Emil Share, DO  methylPREDNISolone  (MEDROL  DOSEPAK) 4 MG TBPK tablet Day 1: 8mg  before breakfast, 4 mg after lunch, 4 mg after supper, and 8 mg at bedtime Day 2: 4 mg before breakfast, 4 mg after lunch, 4 mg  after supper, and 8 mg  at bedtime Day 3:  4 mg  before breakfast, 4 mg  after lunch, 4 mg after supper, and 4 mg  at bedtime Day 4: 4 mg  before breakfast, 4 mg  after lunch, and 4 mg at bedtime Day 5: 4 mg  before breakfast and 4 mg at bedtime Day 6: 4 mg  before breakfast 04/16/24  Yes Adaiah Jaskot, DO  celecoxib  (CELEBREX ) 200 MG capsule Take 200 mg by mouth 2 (two) times daily as needed for moderate pain (pain score 4-6). 01/06/24   [provider]  losartan  (COZAAR ) 25 MG tablet Take 1 tablet (25 mg total) by mouth daily. 12/10/23   Patwardhan, Newman PARAS, MD  metFORMIN  (GLUCOPHAGE ) 500 MG tablet Take 1 tablet (500 mg total) by mouth 2 (two) times daily with a meal. 01/07/24   Tysinger, Alm RAMAN, PA-C  methocarbamol  (ROBAXIN ) 500 MG tablet Take 1 tablet (500 mg total) by mouth every 6 (six) hours as needed for muscle spasms. 01/18/24   Meyran, Suzen Lacks, NP  oxyCODONE -acetaminophen  (PERCOCET) 5-325 MG tablet Take 1 tablet by mouth every 4 (four) hours as needed for severe pain (pain score 7-10). 01/18/24 01/17/25  Meyran, Suzen Lacks, NP   rosuvastatin  (CRESTOR ) 20 MG tablet Take 20 mg by mouth daily.    [provider]  varenicline  (CHANTIX  CONTINUING MONTH PAK) 1 MG tablet Take 1 tablet (1 mg total) by mouth 2 (two) times daily. 10/20/23   Randol Dawes, MD    Allergies: Patient has no known allergies.    Review of Systems  Updated Vital Signs BP (!) 154/75 (BP Location: Right Arm)   Pulse 99   Temp 98.8 F (37.1 C) (Temporal)   Resp 18   Ht 5' 10 (1.778 m)   Wt 83.9 kg   SpO2 100%   BMI 26.54 kg/m   Physical Exam Vitals and nursing note reviewed.  Constitutional:      Appearance: He is well-developed.  HENT:     Head: Normocephalic and atraumatic.  Eyes:     Pupils: Pupils are equal, round, and reactive to light.  Neck:     Vascular: No JVD.  Cardiovascular:     Rate and Rhythm: Normal rate and regular rhythm.     Heart sounds: No murmur heard.    No friction rub. No gallop.  Pulmonary:     Effort: No respiratory distress.     Breath sounds: No wheezing.  Abdominal:  General: There is no distension.     Tenderness: There is no abdominal tenderness. There is no guarding or rebound.  Musculoskeletal:        General: Normal range of motion.     Cervical back: Normal range of motion and neck supple.     Comments: Some edema to the right wrist.  I am able to range it without obvious discomfort.  Some bony deformity at the distal ulna.  Pulse motor and sensation intact distally.  Skin:    Coloration: Skin is not pale.     Findings: No rash.  Neurological:     Mental Status: He is alert and oriented to person, place, and time.  Psychiatric:        Behavior: Behavior normal.     (all labs ordered are listed, but only abnormal results are displayed) Labs Reviewed - No data to display  EKG: None  Radiology: DG Wrist Complete Right Result Date: 04/16/2024 CLINICAL DATA:  wrist pain EXAM: RIGHT WRIST - COMPLETE 3+ VIEW COMPARISON:  None Available. FINDINGS: No acute fracture or  dislocation. There is joint space narrowing along the radiocarpal joint with adjacent fluffy calcifications along predominantly the palmar surface of the wrist. Chronic osseous remodeling of the distal radioulnar joint with osteophyte formation. Subcortical cysts of the distal radius and lunate. No unexpected radiopaque foreign body. Soft tissue edema. IMPRESSION: 1. No acute fracture or dislocation. 2. There are fluffy calcifications along the palmar surface of the carpal bones with joint space narrowing of the radiocarpal joint. 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 Electronically Signed   By: Corean Salter M.D.   On: 04/16/2024 11:25     Procedures   Medications Ordered in the ED  acetaminophen  (TYLENOL ) tablet 1,000 mg (1,000 mg Oral Given 04/16/24 1113)  ketorolac  (TORADOL ) 15 MG/ML injection 15 mg (15 mg Intramuscular Given 04/16/24 1112)  oxyCODONE  (Oxy IR/ROXICODONE ) immediate release tablet 5 mg (5 mg Oral Given 04/16/24 1113)                                    Medical Decision Making Amount and/or Complexity of Data Reviewed Radiology: ordered.  Risk OTC drugs. Prescription drug management.   68 yo M with a chief complaint of right wrist pain.  Patient has had this pain off and on per him.  Unlikely to be septic arthritis by history.  X-ray of the wrist independently interpreted by me without obvious fracture.  Plain film radiology read with maybe chronic pseudogout.  Started on steroids diclofenac  gel orthopedic follow-up.  12:37 PM:  I have discussed the diagnosis/risks/treatment options with the patient and family.  Evaluation and diagnostic testing in the emergency department does not suggest an emergent condition requiring admission or immediate intervention beyond what has been performed at this time.  They will follow up with PCP, ortho. We also discussed returning to the ED immediately  if new or worsening sx occur. We discussed the sx which are most concerning (e.g., sudden worsening pain, fever, inability to tolerate by mouth) that necessitate immediate return. Medications administered to the patient during their visit and any new prescriptions provided to the patient are listed below.  Medications given during this visit Medications  acetaminophen  (TYLENOL ) tablet 1,000 mg (1,000 mg Oral Given 04/16/24 1113)  ketorolac  (TORADOL ) 15 MG/ML injection 15 mg (15 mg Intramuscular Given 04/16/24  1112)  oxyCODONE  (Oxy IR/ROXICODONE ) immediate release tablet 5 mg (5 mg Oral Given 04/16/24 1113)     The patient appears reasonably screen and/or stabilized for discharge and I doubt any other medical condition or other [facility] requiring further screening, evaluation, or treatment in the ED at this time prior to discharge.       Final diagnoses:  Right wrist pain    ED Discharge Orders          Ordered    methylPREDNISolone  (MEDROL  DOSEPAK) 4 MG TBPK tablet        04/16/24 1132    diclofenac  Sodium (VOLTAREN ) 1 % GEL  4 times daily        04/16/24 1132               Emil Share, DO 04/16/24 1237

## 2024-04-17 ENCOUNTER — Ambulatory Visit: Payer: Self-pay

## 2024-04-17 NOTE — Telephone Encounter (Signed)
 FYI Only or Action Required?: FYI only for provider: appointment scheduled on 12.17.25.  Patient was last seen in primary care on 12/29/2023 by [name], PA-C.  Called Nurse Triage reporting Joint Swelling.  Symptoms began ongoing.  Interventions attempted: OTC medications: tylenol  and Prescription medications: steroid.  Symptoms are: gradually worsening.  Triage Disposition: See Physician Within 24 Hours  Patient/caregiver understands and will follow disposition?: Yes    Copied from CRM #[id number]. Topic: Clinical - Red Word Triage >> Apr 17, 2024 10:25 AM Drema MATSU wrote: Red Word that prompted transfer to Nurse Triage: Patient right wrist, hand, and right knee is swollen and it hurt. Patient went to the ER yesterday about his wrist and he states that he was prescribed steroids but it is not really doing anything for pain. >> Apr 17, 2024 10:44 AM Drema MATSU wrote: Patient had to go back to work could not hold. Pt ask that he receive a call back after 3. Reason for Disposition  Numbness (i.e., loss of sensation) in hand or fingers  (Exception: Just tingling; numbness present > 2 weeks.)  Answer Assessment - Initial Assessment Questions Pt seen in ER yesterday for wrist pain. States he is also having right hand and right knee pain. Denies any injury. States he woke up like this there other day. Wrist pain/hand numbness has been on going for years. He states right knee feels like it is going to give out. Wrist is slightly red, pain 10/10. No redness of knee, pain 8/10. Both are swollen. Rn gave strict instructions for pt to return call or go to the ER. Pt stated understanding. Pt denies any higher acuity questions.    1. ONSET: When did the pain start?     Been on going, woke up with it worse yesterday 2. LOCATION: Where is the pain located?     Right hand, wrist, right knee 3. PAIN: How bad is the pain? (Scale 1-10; or mild, moderate, severe)   - MILD (1-3): doesn't  interfere with normal activities   - MODERATE (4-7): interferes with normal activities (e.g., work or school) or awakens from sleep   - SEVERE (8-10): excruciating pain, unable to use hand at all     severe 4. WORK OR EXERCISE: Has there been any recent work or exercise that involved this part (i.e., hand or wrist) of the body?     unknown 5. CAUSE: What do you think is causing the pain?     States woke up like that  7. OTHER SYMPTOMS: Do you have any other symptoms? (e.g., neck pain, swelling, rash, numbness, fever)     Numbness in right hand not new or worse. Denies other symptoms  Protocols used: Hand and Wrist Pain-A-AH

## 2024-04-18 ENCOUNTER — Other Ambulatory Visit: Payer: Self-pay

## 2024-04-18 ENCOUNTER — Ambulatory Visit: Attending: Medical

## 2024-04-18 DIAGNOSIS — R1310 Dysphagia, unspecified: Secondary | ICD-10-CM | POA: Insufficient documentation

## 2024-04-18 DIAGNOSIS — Z09 Encounter for follow-up examination after completed treatment for conditions other than malignant neoplasm: Secondary | ICD-10-CM | POA: Insufficient documentation

## 2024-04-18 DIAGNOSIS — R490 Dysphonia: Secondary | ICD-10-CM | POA: Diagnosis not present

## 2024-04-18 NOTE — Therapy (Signed)
 OUTPATIENT SPEECH LANGUAGE PATHOLOGY SWALLOW EVALUATION   Patient Name: Oscar Myers MRN: [id number] DOB:1955-09-26, 68 y.o., male Today's Date: 04/18/2024  PCP: [name], PA-C REFERRING PROVIDER: [name], PA-C  END OF SESSION:  End of Session - 04/18/24 1636     Visit Number 1    Number of Visits 7    Date for Recertification  05/30/24    SLP Start Time 1400    SLP Stop Time  1442    SLP Time Calculation (min) 42 min    Activity Tolerance Patient tolerated treatment well          Past Medical History:  Diagnosis Date   CTS (carpal tunnel syndrome)    Diabetes mellitus without complication (HCC)    type 2   Dyslipidemia    Hearing loss    currently does not wear hearing aids   Hypertension    Peripheral vascular disease    Smoker    Past Surgical History:  Procedure Laterality Date   ANTERIOR CERVICAL DECOMPRESSION/DISCECTOMY FUSION 4 LEVELS N/A 01/17/2024   Procedure: ANTERIOR CERVICAL DECOMPRESSION/DISCECTOMY FUSION CERVICAL THREE-FOUR CERVICAL FOUR-FIVE CERVICAL FIVE-SIX CERVICAL SIX-SEVEN;  Surgeon: Joshua Alm Hamilton, MD;  Location: [facility] OR;  Service: Neurosurgery;  Laterality: N/A;   BACK SURGERY     CARPAL TUNNEL RELEASE Left 04/30/2016   Procedure: LEFT CARPAL TUNNEL RELEASE ENDOSCOPIC;  Surgeon: Evalene JONETTA Chancy, MD;  Location: [facility];  Service: Orthopedics;  Laterality: Left;   CARPAL TUNNEL RELEASE Left 01/10/2020   Procedure: LEFT CARPAL TUNNEL RELEASE;  Surgeon: Jerri Kay HERO, MD;  Location: [facility];  Service: Orthopedics;  Laterality: Left;   CARPAL TUNNEL RELEASE Right    COLONOSCOPY  05/04/2010   COLONOSCOPY  08/31/2018   repeat 5-10 years.  Dr. Gwenith Pence, [facility]   SHOULDER ARTHROSCOPY WITH ROTATOR CUFF REPAIR AND SUBACROMIAL DECOMPRESSION Left 01/10/2020   Procedure: LEFT SHOULDER ARTHROSCOPY WITH EXTENSIVE DEBRIDEMENT, SUBACROMIAL DECOMPRESSION, DISTAL CLAVICLE EXCISION,  MANIPULATION UNDER ANESTHESIA;  Surgeon: Jerri Kay HERO, MD;  Location: [facility];  Service: Orthopedics;  Laterality: Left;   Patient Active Problem List   Diagnosis Date Noted   S/P cervical spinal fusion 01/17/2024   Encounter for health maintenance examination in adult 05/03/2023   Screening for prostate cancer 05/03/2023   Hyperlipidemia 05/03/2023   Smoker 05/03/2023   Screening for lung cancer 05/03/2023   Vaccine counseling 05/03/2023   Vitamin D  deficiency 05/03/2023   Type 2 diabetes mellitus with hyperglycemia, without long-term current use of insulin  (HCC) 05/03/2023   Advanced directives, counseling/discussion 05/03/2023   Left carpal tunnel syndrome 12/27/2019   Adhesive capsulitis of left shoulder 12/27/2019   Tendinopathy of left biceps 12/27/2019   Hyperlipidemia with target LDL less than 100 06/23/2012   Family history of heart disease in male family member before age 104 06/23/2012   Current smoker 06/23/2012    ONSET DATE: 03/20/2024 (Referral date)   REFERRING DIAG: R13.10 (ICD-10-CM) - Dysphagia, unspecified type R49.0 (ICD-10-CM) - Hoarseness Z09 (ICD-10-CM) - S/P neck surgery, follow-up exam  THERAPY DIAG:  Dysphagia, unspecified type  Rationale for Evaluation and Treatment: Rehabilitation  SUBJECTIVE:   SUBJECTIVE STATEMENT: I can't go out to eat. Pt accompanied by: self  PERTINENT HISTORY: Pt is a 68 y.o. male who presents for a dysphagia evaluation s/p ACDF on 01/17/2024. Pt states that when he swallows any type of solid texture or pill, he experiences it getting stuck in his throat, requiring  him to sometimes cough up what he just swallowed. Pt endorses voice changes after the procedure; however, these voice changes have resolved. Pt has no recent hx of reflux. Pt notes that liquids go down easier than solids. He states that he can feel  a flap-like sensation when he drinks liquids. Pt sometimes experiences minor pain during  swallowing. Pt has not completed an objective swallow study at this time. PMHx: CTS, DM, hearing loss, HTN, PVD, smoker, back surg L TSA.   PAIN:  Are you having pain? No  FALLS: Has patient fallen in last 6 months?  No  LIVING ENVIRONMENT: Lives with: lives with their family and lives with their spouse Lives in: House/apartment  PLOF:  Level of assistance: Independent with ADLs Employment: Part-time employment  PATIENT GOALS: To improve swallowing.   OBJECTIVE:  Note: Objective measures were completed at Evaluation unless otherwise noted. OBJECTIVE:   DIAGNOSTIC FINDINGS: No formal imaging completed for swallowing. SLP recommended pt to get Modified Barium Swallow Study completed to analyze oropharyngeal swallow function due to c/o dysphagia.  INSTRUMENTAL SWALLOW STUDY FINDINGS () TBD Objective swallow impairments: TBD Objective recommended compensations: TBD  COGNITION: Overall cognitive status: Within functional limits for tasks assessed   SUBJECTIVE DYSPHAGIA REPORTS:  Date of onset: 01/17/24 Reported symptoms: choking with solids, odynophagia, and globus sensation  Current diet: regular and thin liquids  Co-morbid voice changes: Yes These voice changes have resolved.   FACTORS WHICH MAY INCREASE RISK OF ADVERSE EVENT IN PRESENCE OF ASPIRATION:  General health: well appearing  Risk factors: none evident     ORAL MOTOR EXAMINATION: Overall status: WFL Comments: N/A  CLINICAL SWALLOW ASSESSMENT:   Dentition: adequate natural dentition Vocal quality at baseline: normal Patient directly observed with POs: Yes: regular and thin liquids  Feeding: able to feed self Liquids provided by: cup [facility] Swallow Protocol: Fail: Immediate coughing  Oral phase signs and symptoms: No oral signs present. Pharyngeal phase signs and symptoms: multiple swallows, immediate cough, complaints of residue, and complaints of globus  PATIENT REPORTED OUTCOME MEASURES (PROM): EAT-10:  28/40 Pt rated a 4 (severe problem) on my swallowing problem interferes with my ability to go out for meals, swallowing solids takes extra effort, and The pleasure of eating is affected by my swallowing.                                                                                                                             TREATMENT DATE:  04/18/24: Evaluation completed. No formal tx completed on this date outside of education.   PATIENT EDUCATION: Education details: Physiology of swallowing, recommendation and reasoning for completing a modified barium swallow study. Effortful swallow as a pharyngeal strengthening exercise. Person educated: Patient Education method: Explanation, Demonstration, and Handouts Education comprehension: verbalized understanding   ASSESSMENT:  CLINICAL IMPRESSION: Patient is a 68 y.o. male who was seen today for dysphagia evaluation. Pt presents with a unspecified dysphagia characterized by pt reports of dysphagia  with solid textures and pills and clinical swallow indicators, such as immediate coughing during yale swallow protocol. From PROM, pt noted significant distress with current swallowing skills, especially in areas of pleasure for eating and eating solid foods. It is highly recommended that pt completes an objective swallow evaluation due to potential s/sx of aspiration and pharyngeal dysphagia s/p ACDF. Dysphagia tx is recommended due to significant impact of dysphagia s/sx on pt's QOL and potential swallow health and safety. Without ST services, pt is potentially at risk for decreased nutrition due to loss of pleasure in eating, as well as potential increased risk of aspiration without aspiration precautions.   OBJECTIVE IMPAIRMENTS: include dysphagia. These impairments are limiting patient from safety when swallowing. Factors affecting potential to achieve goals and functional outcome are lack of objective swallow imaging. Patient will benefit  from skilled SLP services to address above impairments and improve overall function. Pt will also highly benefit from getting an objective swallow test  REHAB POTENTIAL: Excellent   GOALS: Goals reviewed with patient? Yes   LONG TERM GOALS: STGs = LTGs Target date: 05/30/24  Pt will complete MBSS. Baseline:  Goal status: INITIAL  2.  Pt will describe and demonstrate aspiration precautions in 90% of opportunities for optimizing swallow safety.  Baseline:  Goal status: INITIAL  3.  Pt will perform pharyngeal strengthening exercises (effortful swallow) as indicated for maximizing swallow safety and efficiency given rare min A.  Baseline:  Goal status: INITIAL  4.  Pt will score better on EAT-10 displaying reduced s/sx of impacts from dysphagia.  Baseline:  Goal status: INITIAL  5.  TBD Baseline:  Goal status: INITIAL  6.  TBD Baseline:  Goal status: INITIAL  PLAN:  SLP FREQUENCY: 1x/week  SLP DURATION: 6 weeks  PLANNED INTERVENTIONS: Aspiration precaution training, Pharyngeal strengthening exercises, Diet toleration management , SLP instruction and feedback, Compensatory strategies, Patient/family education, and [id number] Treatment of swallowing function    Waddell Music, CF-SLP 04/18/2024, 4:37 PM

## 2024-04-19 ENCOUNTER — Encounter: Payer: Self-pay | Admitting: Family Medicine

## 2024-04-19 ENCOUNTER — Ambulatory Visit (INDEPENDENT_AMBULATORY_CARE_PROVIDER_SITE_OTHER): Admitting: Family Medicine

## 2024-04-19 ENCOUNTER — Telehealth: Payer: Self-pay | Admitting: Internal Medicine

## 2024-04-19 ENCOUNTER — Other Ambulatory Visit: Payer: Self-pay | Admitting: Medical

## 2024-04-19 VITALS — BP 122/82 | HR 103 | Wt 188.4 lb

## 2024-04-19 DIAGNOSIS — M1120 Other chondrocalcinosis, unspecified site: Secondary | ICD-10-CM

## 2024-04-19 DIAGNOSIS — R1310 Dysphagia, unspecified: Secondary | ICD-10-CM

## 2024-04-19 DIAGNOSIS — R490 Dysphonia: Secondary | ICD-10-CM

## 2024-04-19 DIAGNOSIS — Z09 Encounter for follow-up examination after completed treatment for conditions other than malignant neoplasm: Secondary | ICD-10-CM

## 2024-04-19 MED ORDER — COLCHICINE 0.6 MG PO TABS: 0.6000 mg | ORAL_TABLET | Freq: Every day | ORAL | 1 refills | Status: AC

## 2024-04-19 NOTE — Telephone Encounter (Signed)
 I am Waddell Music with [facility], and I am a speech pathologist who just completed a clinical swallow evaluation with Mr. Oscar Myers. He needs a referral to get a modified barium swallow study completed due to s/sx of aspiration from my assessment. If you have any further questions, feel free to reach out, and I can gladly answer them!   Warm regards   Waddell Music, CF-SLP

## 2024-04-19 NOTE — Progress Notes (Signed)
° °  Name: Oscar Myers   Date of Visit: 04/19/2024   Date of last visit with me: Visit date not found   CHIEF COMPLAINT:  Chief Complaint  Patient presents with   Acute Visit    Right wrist and knee pain. Also needs a modified burium swallow study.       HPI:  Discussed the use of AI scribe software for clinical note transcription with the patient, who gave verbal consent to proceed.  History of Present Illness   Oscar Myers is a 68 year old male with pseudogout who presents with wrist and knee pain.  He experiences recurrent episodes of wrist and knee pain due to pseudogout, with the most recent episode being the most severe. The pain primarily affects his right knee and wrist, with the wrist being the main concern during his recent emergency room visit. His hand is often swollen, though the swelling has reduced since starting a steroid prescribed at the emergency room. No x-rays were performed on his knee at that time, and he has not been informed of any specific findings from the wrist x-ray taken at the emergency room.  He has a history of low platelet counts, which he does not take any medication for, and denies any issues related to this condition. He also reports a long-standing history of elevated calcium  levels.  In terms of lifestyle factors, he consumes a lot of red meat and has a low water intake, which might contribute to his symptoms. He denies regular alcohol consumption, stating he only drinks beer occasionally.         OBJECTIVE:       01/19/2024   12:34 PM  Depression screen PHQ 2/9  Decreased Interest 0  Down, Depressed, Hopeless 0  PHQ - 2 Score 0     BP Readings from Last 3 Encounters:  04/19/24 122/82  04/16/24 (!) 154/75  01/18/24 (!) 145/90    BP 122/82   Pulse (!) 103   Wt 188 lb 6.4 oz (85.5 kg)   SpO2 98%   BMI 27.03 kg/m    Physical Exam          Physical Exam Musculoskeletal:     Comments: Swelling of the right wrist  when compared to the left.  No tenderness to palpation of the right wrist.     ASSESSMENT/PLAN:   Assessment & Plan Pseudogout    Assessment and Plan    Pseudogout Chronic pseudogout with frequent flare-ups, primarily affecting the wrist and right knee. Recent severe flare-up in the wrist improved with steroid treatment. Pseudogout more likely than gout due to joint involvement and chronicity. Previous labs showed slightly elevated calcium  levels. - Ordered iron and uric acid levels to rule out iron overload and assess uric acid levels. - Recommended magnesium glycinate supplementation for potential benefits in reducing pseudogout symptoms and aiding sleep. - Initiated colchicine  as a preventative medication to reduce flare-ups. - Advised completion of current steroid pack. - Encouraged increased water intake to 60 ounces per day, avoiding coffee and tea.         Yaret Hush A. Vita MD [facility]

## 2024-04-19 NOTE — Patient Instructions (Signed)
 Please start taking magnesium glycinate

## 2024-04-19 NOTE — Telephone Encounter (Signed)
 Barium swallow placed

## 2024-04-19 NOTE — Telephone Encounter (Signed)
 I have pended this for you. And you can go in and finish the order. It worked for me

## 2024-04-19 NOTE — Telephone Encounter (Signed)
 Modified Barium Swallow is [id number]

## 2024-04-20 ENCOUNTER — Other Ambulatory Visit (HOSPITAL_COMMUNITY): Payer: Self-pay | Admitting: Medical

## 2024-04-20 DIAGNOSIS — R059 Cough, unspecified: Secondary | ICD-10-CM

## 2024-04-20 DIAGNOSIS — R1310 Dysphagia, unspecified: Secondary | ICD-10-CM

## 2024-04-20 LAB — IRON,TIBC AND FERRITIN PANEL
Ferritin: 135 ng/mL (ref 30–400)
Iron Saturation: 11 % — ABNORMAL LOW (ref 15–55)
Iron: 31 ug/dL — ABNORMAL LOW (ref 38–169)
Total Iron Binding Capacity: 295 ug/dL (ref 250–450)
UIBC: 264 ug/dL (ref 111–343)

## 2024-04-20 LAB — ALBUMIN: Albumin: 4.3 g/dL (ref 3.9–4.9)

## 2024-04-20 LAB — PARATHYROID HORMONE, INTACT (NO CA): PTH: 61 pg/mL (ref 15–65)

## 2024-04-20 LAB — URIC ACID: Uric Acid: 5.4 mg/dL (ref 3.8–8.4)

## 2024-04-21 ENCOUNTER — Encounter: Payer: Self-pay | Admitting: Family Medicine

## 2024-04-21 ENCOUNTER — Ambulatory Visit: Payer: Self-pay | Admitting: Family Medicine

## 2024-04-21 DIAGNOSIS — E611 Iron deficiency: Secondary | ICD-10-CM

## 2024-04-21 MED ORDER — IRON (FERROUS SULFATE) 325 (65 FE) MG PO TABS: 325.0000 mg | ORAL_TABLET | Freq: Every day | ORAL | 1 refills | Status: AC

## 2024-04-28 ENCOUNTER — Encounter: Admitting: Medical

## 2024-05-01 ENCOUNTER — Ambulatory Visit

## 2024-05-01 DIAGNOSIS — R1310 Dysphagia, unspecified: Secondary | ICD-10-CM | POA: Diagnosis not present

## 2024-05-01 NOTE — Patient Instructions (Addendum)
 Swallow Precautions:  Take smaller bites and sips. Eat at a slower pace.   Cough/Throat Clear Strategies:  Take a sip of water. Swallow hard Shhhhh, followed by a breath   Swallow Exercises:   Effortful Swallow: Try to do 10-15x every few hours. Tongue Swallow:   Try to do 5-10x every few hours.

## 2024-05-01 NOTE — Therapy (Signed)
 "  OUTPATIENT SPEECH LANGUAGE PATHOLOGY SWALLOW TREATMENT   Patient Name: Oscar Myers MRN: [id number] DOB:27-Jun-1955, 68 y.o., male Today's Date: 05/01/2024  PCP: [name], PA-C REFERRING PROVIDER: [name], PA-C  END OF SESSION:  End of Session - 05/01/24 1441     Visit Number 2    Number of Visits 7    Date for Recertification  05/30/24    Authorization Type [employer] Medicare    SLP Start Time 1405    SLP Stop Time  1441    SLP Time Calculation (min) 36 min    Activity Tolerance Patient tolerated treatment well           Past Medical History:  Diagnosis Date   CTS (carpal tunnel syndrome)    Diabetes mellitus without complication (HCC)    type 2   Dyslipidemia    Hearing loss    currently does not wear hearing aids   Hypertension    Peripheral vascular disease    Smoker    Past Surgical History:  Procedure Laterality Date   ANTERIOR CERVICAL DECOMPRESSION/DISCECTOMY FUSION 4 LEVELS N/A 01/17/2024   Procedure: ANTERIOR CERVICAL DECOMPRESSION/DISCECTOMY FUSION CERVICAL THREE-FOUR CERVICAL FOUR-FIVE CERVICAL FIVE-SIX CERVICAL SIX-SEVEN;  Surgeon: Joshua Alm Hamilton, MD;  Location: [facility] OR;  Service: Neurosurgery;  Laterality: N/A;   BACK SURGERY     CARPAL TUNNEL RELEASE Left 04/30/2016   Procedure: LEFT CARPAL TUNNEL RELEASE ENDOSCOPIC;  Surgeon: Evalene JONETTA Chancy, MD;  Location: [facility];  Service: Orthopedics;  Laterality: Left;   CARPAL TUNNEL RELEASE Left 01/10/2020   Procedure: LEFT CARPAL TUNNEL RELEASE;  Surgeon: Jerri Kay HERO, MD;  Location: [facility];  Service: Orthopedics;  Laterality: Left;   CARPAL TUNNEL RELEASE Right    COLONOSCOPY  05/04/2010   COLONOSCOPY  08/31/2018   repeat 5-10 years.  Dr. Gwenith Pence, [facility]   SHOULDER ARTHROSCOPY WITH ROTATOR CUFF REPAIR AND SUBACROMIAL DECOMPRESSION Left 01/10/2020   Procedure: LEFT SHOULDER ARTHROSCOPY WITH EXTENSIVE DEBRIDEMENT, SUBACROMIAL  DECOMPRESSION, DISTAL CLAVICLE EXCISION, MANIPULATION UNDER ANESTHESIA;  Surgeon: Jerri Kay HERO, MD;  Location:  SURGERY CENTER;  Service: Orthopedics;  Laterality: Left;   Patient Active Problem List   Diagnosis Date Noted   S/P cervical spinal fusion 01/17/2024   Encounter for health maintenance examination in adult 05/03/2023   Screening for prostate cancer 05/03/2023   Hyperlipidemia 05/03/2023   Smoker 05/03/2023   Screening for lung cancer 05/03/2023   Vaccine counseling 05/03/2023   Vitamin D  deficiency 05/03/2023   Type 2 diabetes mellitus with hyperglycemia, without long-term current use of insulin  (HCC) 05/03/2023   Advanced directives, counseling/discussion 05/03/2023   Left carpal tunnel syndrome 12/27/2019   Adhesive capsulitis of left shoulder 12/27/2019   Tendinopathy of left biceps 12/27/2019   Hyperlipidemia with target LDL less than 100 06/23/2012   Family history of heart disease in male family member before age 70 06/23/2012   Current smoker 06/23/2012    ONSET DATE: 03/20/2024 (Referral date)   REFERRING DIAG: R13.10 (ICD-10-CM) - Dysphagia, unspecified type R49.0 (ICD-10-CM) - Hoarseness Z09 (ICD-10-CM) - S/P neck surgery, follow-up exam  THERAPY DIAG:  Dysphagia, unspecified type  Rationale for Evaluation and Treatment: Rehabilitation  SUBJECTIVE:   SUBJECTIVE STATEMENT: I can't go out to eat. Pt accompanied by: self  PERTINENT HISTORY: Pt is a 67 y.o. male who presents for a dysphagia evaluation s/p ACDF on 01/17/2024. Pt states that when he swallows any type of solid texture or pill,  he experiences it getting stuck in his throat, requiring him to sometimes cough up what he just swallowed. Pt endorses voice changes after the procedure; however, these voice changes have resolved. Pt has no recent hx of reflux. Pt notes that liquids go down easier than solids. He states that he can feel  a flap-like sensation when he drinks liquids. Pt  sometimes experiences minor pain during swallowing. Pt has not completed an objective swallow study at this time. PMHx: CTS, DM, hearing loss, HTN, PVD, smoker, back surg L TSA.   PAIN:  Are you having pain? No  FALLS: Has patient fallen in last 6 months?  No  LIVING ENVIRONMENT: Lives with: lives with their family and lives with their spouse Lives in: House/apartment  PLOF:  Level of assistance: Independent with ADLs Employment: Part-time employment  PATIENT GOALS: To improve swallowing.   OBJECTIVE:  Note: Objective measures were completed at Evaluation unless otherwise noted. OBJECTIVE:   DIAGNOSTIC FINDINGS: No formal imaging completed for swallowing. SLP recommended pt to get Modified Barium Swallow Study completed to analyze oropharyngeal swallow function due to c/o dysphagia.  INSTRUMENTAL SWALLOW STUDY FINDINGS () TBD Objective swallow impairments: TBD Objective recommended compensations: TBD  COGNITION: Overall cognitive status: Within functional limits for tasks assessed   SUBJECTIVE DYSPHAGIA REPORTS:  Date of onset: 01/17/24 Reported symptoms: choking with solids, odynophagia, and globus sensation  Current diet: regular and thin liquids  Co-morbid voice changes: Yes These voice changes have resolved.   FACTORS WHICH MAY INCREASE RISK OF ADVERSE EVENT IN PRESENCE OF ASPIRATION:  General health: well appearing  Risk factors: none evident     ORAL MOTOR EXAMINATION: Overall status: WFL Comments: N/A  CLINICAL SWALLOW ASSESSMENT:   Dentition: adequate natural dentition Vocal quality at baseline: normal Patient directly observed with POs: Yes: regular and thin liquids  Feeding: able to feed self Liquids provided by: cup Yale Swallow Protocol: Fail: Immediate coughing  Oral phase signs and symptoms: No oral signs present. Pharyngeal phase signs and symptoms: multiple swallows, immediate cough, complaints of residue, and complaints of globus  PATIENT  REPORTED OUTCOME MEASURES (PROM): EAT-10: 28/40 Pt rated a 4 (severe problem) on my swallowing problem interferes with my ability to go out for meals, swallowing solids takes extra effort, and The pleasure of eating is affected by my swallowing.                                                                                                                             TREATMENT DATE:  05/01/24: Pt was able to schedule MBSS for 05/22/24. SLP introduced swallow precautions for optimizing pt swallow safety (small bites/sips, eating at slow rate). Pt taught-back swallow precautions independently with 100% consistency. Pt notes that he clears his throat very often and often has to cough even outside of meals. SLP educated pt in strategies for minimizing throat clearing to promote throat hygiene. SLP guided pt through taking water sips, hard swallows, and using  a shhhh followed by deep breath for minimizing throat clearing and excessive coughing. SLP introduced pharyngeal swallow exercises (effortful swallow and Masako) for maximizing pt's swallow strength. Pt demonstrated successful use of pharyngeal strengthening exercises in 100% of opportunities given rare min A. SLP provided pt instruction sheet for implementing swallow precautions, throat clearing strategies, and pharyngeal swallow exercises at home. Plan is to continue pharyngeal strengthening exercises and throat clearing strategies for optimizing pt's QOL.  04/18/24: Evaluation completed. No formal tx completed on this date outside of education.   PATIENT EDUCATION: Education details: Physiology of swallowing, recommendation and reasoning for completing a modified barium swallow study. Effortful swallow as a pharyngeal strengthening exercise. Person educated: Patient Education method: Explanation, Demonstration, and Handouts Education comprehension: verbalized understanding   ASSESSMENT:  CLINICAL IMPRESSION: Patient is a 68 y.o. male  who was seen today for dysphagia evaluation. Pt presents with a unspecified dysphagia characterized by pt reports of dysphagia with solid textures and pills and clinical swallow indicators, such as immediate coughing during yale swallow protocol. From PROM, pt noted significant distress with current swallowing skills, especially in areas of pleasure for eating and eating solid foods. It is highly recommended that pt completes an objective swallow evaluation due to potential s/sx of aspiration and pharyngeal dysphagia s/p ACDF. Dysphagia tx is recommended due to significant impact of dysphagia s/sx on pt's QOL and potential swallow health and safety. Without ST services, pt is potentially at risk for decreased nutrition due to loss of pleasure in eating, as well as potential increased risk of aspiration without aspiration precautions.   OBJECTIVE IMPAIRMENTS: include dysphagia. These impairments are limiting patient from safety when swallowing. Factors affecting potential to achieve goals and functional outcome are lack of objective swallow imaging. Patient will benefit from skilled SLP services to address above impairments and improve overall function. Pt will also highly benefit from getting an objective swallow test  REHAB POTENTIAL: Excellent   GOALS: Goals reviewed with patient? Yes   LONG TERM GOALS: STGs = LTGs Target date: 05/30/24  Pt will complete MBSS. Baseline:  Goal status: ONGOING  2.  Pt will describe and demonstrate aspiration precautions in 90% of opportunities for optimizing swallow safety.  Baseline:  Goal status: ONGOING  3.  Pt will perform pharyngeal strengthening exercises (effortful swallow) as indicated for maximizing swallow safety and efficiency given rare min A.  Baseline:  Goal status: ONGOING  4.  Pt will score better on EAT-10 displaying reduced s/sx of impacts from dysphagia.  Baseline:  Goal status: ONGOING  5.  TBD Baseline:  Goal status:  ONGOING  6.  TBD Baseline:  Goal status: ONGOING  PLAN:  SLP FREQUENCY: 1x/week  SLP DURATION: 6 weeks  PLANNED INTERVENTIONS: Aspiration precaution training, Pharyngeal strengthening exercises, Diet toleration management , SLP instruction and feedback, Compensatory strategies, Patient/family education, and 07473 Treatment of swallowing function    Waddell Music, CF-SLP 05/01/2024, 2:42 PM          "

## 2024-05-02 ENCOUNTER — Inpatient Hospital Stay: Admitting: Medical

## 2024-05-09 ENCOUNTER — Encounter: Payer: Medicare Other | Admitting: Medical

## 2024-05-10 ENCOUNTER — Ambulatory Visit: Attending: Medical

## 2024-05-10 DIAGNOSIS — R1312 Dysphagia, oropharyngeal phase: Secondary | ICD-10-CM | POA: Insufficient documentation

## 2024-05-10 DIAGNOSIS — R1310 Dysphagia, unspecified: Secondary | ICD-10-CM | POA: Diagnosis present

## 2024-05-10 NOTE — Therapy (Signed)
 "  OUTPATIENT SPEECH LANGUAGE PATHOLOGY SWALLOW TREATMENT   Patient Name: Oscar Myers MRN: [id number] DOB:1955-07-05, 69 y.o., male Today's Date: 05/10/2024  PCP: [name], PA-C REFERRING PROVIDER: [name], PA-C  END OF SESSION:  End of Session - 05/10/24 1514     Visit Number 3    Number of Visits 7    Date for Recertification  05/30/24    Authorization Type [employer] Medicare    SLP Start Time 1454    SLP Stop Time  1515    SLP Time Calculation (min) 21 min    Activity Tolerance Patient tolerated treatment well            Past Medical History:  Diagnosis Date   CTS (carpal tunnel syndrome)    Diabetes mellitus without complication (HCC)    type 2   Dyslipidemia    Hearing loss    currently does not wear hearing aids   Hypertension    Peripheral vascular disease    Smoker    Past Surgical History:  Procedure Laterality Date   ANTERIOR CERVICAL DECOMPRESSION/DISCECTOMY FUSION 4 LEVELS N/A 01/17/2024   Procedure: ANTERIOR CERVICAL DECOMPRESSION/DISCECTOMY FUSION CERVICAL THREE-FOUR CERVICAL FOUR-FIVE CERVICAL FIVE-SIX CERVICAL SIX-SEVEN;  Surgeon: Joshua Alm Hamilton, MD;  Location: [facility] OR;  Service: Neurosurgery;  Laterality: N/A;   BACK SURGERY     CARPAL TUNNEL RELEASE Left 04/30/2016   Procedure: LEFT CARPAL TUNNEL RELEASE ENDOSCOPIC;  Surgeon: Evalene JONETTA Chancy, MD;  Location: [facility];  Service: Orthopedics;  Laterality: Left;   CARPAL TUNNEL RELEASE Left 01/10/2020   Procedure: LEFT CARPAL TUNNEL RELEASE;  Surgeon: Jerri Kay HERO, MD;  Location: [facility];  Service: Orthopedics;  Laterality: Left;   CARPAL TUNNEL RELEASE Right    COLONOSCOPY  05/04/2010   COLONOSCOPY  08/31/2018   repeat 5-10 years.  Dr. Gwenith Pence, [facility]   SHOULDER ARTHROSCOPY WITH ROTATOR CUFF REPAIR AND SUBACROMIAL DECOMPRESSION Left 01/10/2020   Procedure: LEFT SHOULDER ARTHROSCOPY WITH EXTENSIVE DEBRIDEMENT, SUBACROMIAL  DECOMPRESSION, DISTAL CLAVICLE EXCISION, MANIPULATION UNDER ANESTHESIA;  Surgeon: Jerri Kay HERO, MD;  Location: [facility];  Service: Orthopedics;  Laterality: Left;   Patient Active Problem List   Diagnosis Date Noted   S/P cervical spinal fusion 01/17/2024   Encounter for health maintenance examination in adult 05/03/2023   Screening for prostate cancer 05/03/2023   Hyperlipidemia 05/03/2023   Smoker 05/03/2023   Screening for lung cancer 05/03/2023   Vaccine counseling 05/03/2023   Vitamin D  deficiency 05/03/2023   Type 2 diabetes mellitus with hyperglycemia, without long-term current use of insulin  (HCC) 05/03/2023   Advanced directives, counseling/discussion 05/03/2023   Left carpal tunnel syndrome 12/27/2019   Adhesive capsulitis of left shoulder 12/27/2019   Tendinopathy of left biceps 12/27/2019   Hyperlipidemia with target LDL less than 100 06/23/2012   Family history of heart disease in male family member before age 50 06/23/2012   Current smoker 06/23/2012    ONSET DATE: 03/20/2024 (Referral date)   REFERRING DIAG: R13.10 (ICD-10-CM) - Dysphagia, unspecified type R49.0 (ICD-10-CM) - Hoarseness Z09 (ICD-10-CM) - S/P neck surgery, follow-up exam  THERAPY DIAG:  Dysphagia, unspecified type  Rationale for Evaluation and Treatment: Rehabilitation  SUBJECTIVE:   SUBJECTIVE STATEMENT: I can't go out to eat. Pt accompanied by: self  PERTINENT HISTORY: Pt is a 69 y.o. male who presents for a dysphagia evaluation s/p ACDF on 01/17/2024. Pt states that when he swallows any type of solid texture or  pill, he experiences it getting stuck in his throat, requiring him to sometimes cough up what he just swallowed. Pt endorses voice changes after the procedure; however, these voice changes have resolved. Pt has no recent hx of reflux. Pt notes that liquids go down easier than solids. He states that he can feel  a flap-like sensation when he drinks liquids. Pt  sometimes experiences minor pain during swallowing. Pt has not completed an objective swallow study at this time. PMHx: CTS, DM, hearing loss, HTN, PVD, smoker, back surg L TSA.   PAIN:  Are you having pain? No  FALLS: Has patient fallen in last 6 months?  No  LIVING ENVIRONMENT: Lives with: lives with their family and lives with their spouse Lives in: House/apartment  PLOF:  Level of assistance: Independent with ADLs Employment: Part-time employment  PATIENT GOALS: To improve swallowing.   OBJECTIVE:  Note: Objective measures were completed at Evaluation unless otherwise noted. OBJECTIVE:   DIAGNOSTIC FINDINGS: No formal imaging completed for swallowing. SLP recommended pt to get Modified Barium Swallow Study completed to analyze oropharyngeal swallow function due to c/o dysphagia.  INSTRUMENTAL SWALLOW STUDY FINDINGS () TBD Objective swallow impairments: TBD Objective recommended compensations: TBD  COGNITION: Overall cognitive status: Within functional limits for tasks assessed   SUBJECTIVE DYSPHAGIA REPORTS:  Date of onset: 01/17/24 Reported symptoms: choking with solids, odynophagia, and globus sensation  Current diet: regular and thin liquids  Co-morbid voice changes: Yes These voice changes have resolved.   FACTORS WHICH MAY INCREASE RISK OF ADVERSE EVENT IN PRESENCE OF ASPIRATION:  General health: well appearing  Risk factors: none evident     ORAL MOTOR EXAMINATION: Overall status: WFL Comments: N/A  CLINICAL SWALLOW ASSESSMENT:   Dentition: adequate natural dentition Vocal quality at baseline: normal Patient directly observed with POs: Yes: regular and thin liquids  Feeding: able to feed self Liquids provided by: cup Yale Swallow Protocol: Fail: Immediate coughing  Oral phase signs and symptoms: No oral signs present. Pharyngeal phase signs and symptoms: multiple swallows, immediate cough, complaints of residue, and complaints of globus  PATIENT  REPORTED OUTCOME MEASURES (PROM): EAT-10: 28/40 Pt rated a 4 (severe problem) on my swallowing problem interferes with my ability to go out for meals, swallowing solids takes extra effort, and The pleasure of eating is affected by my swallowing.                                                                                                                             TREATMENT DATE:  05/10/24: Pt is utilizing swallow and throat clearing/cough strategies at home. Pt notices that he is having less trouble with things getting stuck. SLP reviewed pharyngeal swallow exercises (effortful swallow and Masako) for maximizing pt's swallow strength. Pt demonstrated successful use of pharyngeal strengthening exercises in 100% during PO trials of thin-liquidsof opportunities given rare min A. SLP introduced chin-tuck against resistance (CTAR) as another pharyngeal strengthening exercise for maximizing swallow. Pt performed  CTAR with 100% consistency given rare min A for chin positioning. Pt is making progress with swallow goals. Pt notices that his throat clearing has reduced substantially since previous session. Plan is to continue pharyngeal strengthening exercises, as pt waits for MBSS on 05/22/24.   05/01/24: Pt was able to schedule MBSS for 05/22/24. SLP introduced swallow precautions for optimizing pt swallow safety (small bites/sips, eating at slow rate). Pt taught-back swallow precautions independently with 100% consistency. Pt notes that he clears his throat very often and often has to cough even outside of meals. SLP educated pt in strategies for minimizing throat clearing to promote throat hygiene. SLP guided pt through taking water sips, hard swallows, and using a shhhh followed by deep breath for minimizing throat clearing and excessive coughing. SLP introduced pharyngeal swallow exercises (effortful swallow and Masako) for maximizing pt's swallow strength. Pt demonstrated successful use of  pharyngeal strengthening exercises in 100% of opportunities given rare min A. SLP provided pt instruction sheet for implementing swallow precautions, throat clearing strategies, and pharyngeal swallow exercises at home. Plan is to continue pharyngeal strengthening exercises and throat clearing strategies for optimizing pt's QOL.  04/18/24: Evaluation completed. No formal tx completed on this date outside of education.   PATIENT EDUCATION: Education details: Physiology of swallowing, recommendation and reasoning for completing a modified barium swallow study. Effortful swallow as a pharyngeal strengthening exercise. Person educated: Patient Education method: Explanation, Demonstration, and Handouts Education comprehension: verbalized understanding   ASSESSMENT:  CLINICAL IMPRESSION: Patient is a 69 y.o. male who was seen today for dysphagia evaluation. Pt presents with a unspecified dysphagia characterized by pt reports of dysphagia with solid textures and pills and clinical swallow indicators, such as immediate coughing during yale swallow protocol. From PROM, pt noted significant distress with current swallowing skills, especially in areas of pleasure for eating and eating solid foods. It is highly recommended that pt completes an objective swallow evaluation due to potential s/sx of aspiration and pharyngeal dysphagia s/p ACDF. Dysphagia tx is recommended due to significant impact of dysphagia s/sx on pt's QOL and potential swallow health and safety. Without ST services, pt is potentially at risk for decreased nutrition due to loss of pleasure in eating, as well as potential increased risk of aspiration without aspiration precautions.   OBJECTIVE IMPAIRMENTS: include dysphagia. These impairments are limiting patient from safety when swallowing. Factors affecting potential to achieve goals and functional outcome are lack of objective swallow imaging. Patient will benefit from skilled SLP  services to address above impairments and improve overall function. Pt will also highly benefit from getting an objective swallow test  REHAB POTENTIAL: Excellent   GOALS: Goals reviewed with patient? Yes   LONG TERM GOALS: STGs = LTGs Target date: 05/30/24  Pt will complete MBSS. Baseline:  Goal status: ONGOING  2.  Pt will describe and demonstrate aspiration precautions in 90% of opportunities for optimizing swallow safety.  Baseline:  Goal status: ONGOING  3.  Pt will perform pharyngeal strengthening exercises (effortful swallow) as indicated for maximizing swallow safety and efficiency given rare min A.  Baseline:  Goal status: ONGOING  4.  Pt will score better on EAT-10 displaying reduced s/sx of impacts from dysphagia.  Baseline:  Goal status: ONGOING  5.  TBD Baseline:  Goal status: ONGOING  6.  TBD Baseline:  Goal status: ONGOING  PLAN:  SLP FREQUENCY: 1x/week  SLP DURATION: 6 weeks  PLANNED INTERVENTIONS: Aspiration precaution training, Pharyngeal strengthening exercises, Diet toleration management , SLP instruction and  feedback, Compensatory strategies, Patient/family education, and [id number] Treatment of swallowing function    Waddell Music, CF-SLP 05/10/2024, 3:20 PM          "

## 2024-05-11 ENCOUNTER — Ambulatory Visit (INDEPENDENT_AMBULATORY_CARE_PROVIDER_SITE_OTHER): Payer: Self-pay | Admitting: Medical

## 2024-05-11 VITALS — BP 122/78 | HR 106 | Ht 69.75 in | Wt 187.6 lb

## 2024-05-11 DIAGNOSIS — E1165 Type 2 diabetes mellitus with hyperglycemia: Secondary | ICD-10-CM | POA: Diagnosis not present

## 2024-05-11 DIAGNOSIS — Z122 Encounter for screening for malignant neoplasm of respiratory organs: Secondary | ICD-10-CM | POA: Diagnosis not present

## 2024-05-11 DIAGNOSIS — Z0000 Encounter for general adult medical examination without abnormal findings: Secondary | ICD-10-CM | POA: Diagnosis not present

## 2024-05-11 DIAGNOSIS — Z1211 Encounter for screening for malignant neoplasm of colon: Secondary | ICD-10-CM

## 2024-05-11 DIAGNOSIS — Z87891 Personal history of nicotine dependence: Secondary | ICD-10-CM

## 2024-05-11 DIAGNOSIS — E785 Hyperlipidemia, unspecified: Secondary | ICD-10-CM | POA: Diagnosis not present

## 2024-05-11 DIAGNOSIS — Z125 Encounter for screening for malignant neoplasm of prostate: Secondary | ICD-10-CM | POA: Diagnosis not present

## 2024-05-11 DIAGNOSIS — E559 Vitamin D deficiency, unspecified: Secondary | ICD-10-CM

## 2024-05-11 NOTE — Progress Notes (Signed)
 "  Name: Oscar Myers   Date of Visit: 05/11/2024   CHIEF COMPLAINT:  Chief Complaint  Patient presents with   Annual Exam    Fasting cpe, no concerns, eye exam- warby parker- friendly (requested)       HPI:  Discussed the use of AI scribe software for clinical note transcription with the patient, who gave verbal consent to proceed.  History of Present Illness  Here for well visit  Patient Care Team: Mazella Deen, Alm RAMAN, PA-C as PCP - General (Family Medicine) Elmira Newman PARAS, MD as PCP - Cardiology [facility]   Oscar Myers is a 69 year old male who presents for a well visit.  He reports knee swelling and pain. His uric acid levels were recently checked and were not elevated.  Knee pains have been going on for the past 2 years at least.  No recent fall or injury.  He takes colchicine  as needed, iron  twice daily for previously low iron  levels, losartan  25 mg for hypertension, metformin  500 mg twice a day, and atorvastatin 20 mg daily. His vitamin D  has been low in the past, but he is not currently on a supplement.  He is due for a colonoscopy, which was last performed five years ago. He is not regularly seeing a urologist or gastroenterologist.  He quit smoking in June and is not currently smoking.  He utilizes both the [facility] clinics as well as our clinic for primary care and other specialty  Quit smoking 6/25   Allergies[1]  Past Medical History:  Diagnosis Date   CTS (carpal tunnel syndrome)    Diabetes mellitus without complication (HCC)    type 2   Dyslipidemia    Hearing loss    currently does not wear hearing aids   Hypertension    Peripheral vascular disease    Smoker     Medications Ordered Prior to Encounter[2]   Current Medications[3]  No family history on file.  Past Surgical History:  Procedure Laterality Date   ANTERIOR CERVICAL DECOMPRESSION/DISCECTOMY FUSION 4 LEVELS N/A 01/17/2024   Procedure:  ANTERIOR CERVICAL DECOMPRESSION/DISCECTOMY FUSION CERVICAL THREE-FOUR CERVICAL FOUR-FIVE CERVICAL FIVE-SIX CERVICAL SIX-SEVEN;  Surgeon: Joshua Alm Hamilton, MD;  Location: [facility] OR;  Service: Neurosurgery;  Laterality: N/A;   BACK SURGERY     CARPAL TUNNEL RELEASE Left 04/30/2016   Procedure: LEFT CARPAL TUNNEL RELEASE ENDOSCOPIC;  Surgeon: Evalene JONETTA Chancy, MD;  Location: [facility];  Service: Orthopedics;  Laterality: Left;   CARPAL TUNNEL RELEASE Left 01/10/2020   Procedure: LEFT CARPAL TUNNEL RELEASE;  Surgeon: Jerri Kay HERO, MD;  Location: [facility];  Service: Orthopedics;  Laterality: Left;   CARPAL TUNNEL RELEASE Right    COLONOSCOPY  05/04/2010   COLONOSCOPY  08/31/2018   repeat 5-10 years.  Dr. Gwenith Pence, [facility]   SHOULDER ARTHROSCOPY WITH ROTATOR CUFF REPAIR AND SUBACROMIAL DECOMPRESSION Left 01/10/2020   Procedure: LEFT SHOULDER ARTHROSCOPY WITH EXTENSIVE DEBRIDEMENT, SUBACROMIAL DECOMPRESSION, DISTAL CLAVICLE EXCISION, MANIPULATION UNDER ANESTHESIA;  Surgeon: Jerri Kay HERO, MD;  Location: [facility];  Service: Orthopedics;  Laterality: Left;    Review of Systems  Constitutional:  Negative for chills, fever, malaise/fatigue and weight loss.  HENT:  Negative for congestion, ear pain, hearing loss, sore throat and tinnitus.   Eyes:  Negative for blurred vision, pain and redness.  Respiratory:  Negative for cough, hemoptysis and shortness of breath.   Cardiovascular:  Negative for chest pain, palpitations,  orthopnea, claudication and leg swelling.  Gastrointestinal:  Negative for abdominal pain, blood in stool, constipation, diarrhea, nausea and vomiting.  Genitourinary:  Negative for dysuria, flank pain, frequency, hematuria and urgency.  Musculoskeletal:  Positive for joint pain. Negative for falls and myalgias.  Skin:  Negative for itching and rash.  Neurological:  Negative for dizziness, tingling, speech change, weakness and  headaches.  Endo/Heme/Allergies:  Negative for polydipsia. Does not bruise/bleed easily.  Psychiatric/Behavioral:  Negative for depression and memory loss. The patient is not nervous/anxious and does not have insomnia.       OBJECTIVE:    BP 122/78   Pulse (!) 106   Ht 5' 9.75 (1.772 m)   Wt 187 lb 9.6 oz (85.1 kg)   BMI 27.11 kg/m    General appearence: alert, no distress, WD/WN, African American male Skin unremarkable HEENT: normocephalic, sclerae anicteric, PERRLA, EOMi, nares patent, no discharge or erythema, pharynx normal Oral cavity: MMM, no lesions Neck: supple, no lymphadenopathy, no thyromegaly, no masses, no bruits Heart: RRR, normal S1, S2, no murmurs Lungs: CTA bilaterally, no wheezes, rhonchi, or rales Abdomen: +bs, soft, non tender, non distended, no masses, no hepatomegaly, no splenomegaly Extremities: no edema, no cyanosis, no clubbing MSK: Tender over the right knee joint line patellar tendon and patella with mild swelling of the joint in general, pain with meniscus test and possible some laxity of the [facility] joint, otherwise range of motion okay.  Rest of leg exam unremarkable Pulses: 2+ symmetric, upper and lower extremities, normal cap refill Declines GU/rectal   Diabetic Foot Exam - Simple   Simple Foot Form Diabetic Foot exam was performed with the following findings: Yes 05/11/2024  3:35 PM  Visual Inspection No deformities, no ulcerations, no other skin breakdown bilaterally: Yes Sensation Testing Intact to touch and monofilament testing bilaterally: Yes Pulse Check Posterior Tibialis and Dorsalis pulse intact bilaterally: Yes Comments       ASSESSMENT/PLAN:   Encounter Diagnoses  Name Primary?   Encounter for health maintenance examination in adult Yes   Screening for lung cancer    Former smoker    Hyperlipidemia, unspecified hyperlipidemia type    Vitamin D  deficiency    Type 2 diabetes mellitus with hyperglycemia, without long-term current  use of insulin  (HCC)    Screening for prostate cancer    Screening for colon cancer     Adult Wellness Visit Routine wellness visit with focus on screenings and vaccinations. - Ordered PSA test for prostate cancer screening. - Advised to schedule colonoscopy at [facility]. - Ordered chest CT for lung cancer screening. - Performed routine labs including hemoglobin A1c, liver and kidney function, electrolytes, cholesterol, and urine analysis.  Type 2 diabetes mellitus Managed with metformin  500 mg twice daily. - Ordered hemoglobin A1c test.  Hyperlipidemia Managed with rosuvastatin  20 mg daily.  Hypertension Managed with losartan  25 mg daily.  Vitamin D  deficiency Low levels. - Ordered vitamin D  level test.  Gout Recent knee swelling and pain. Uric acid levels normal, suggesting osteoarthritis. - Continue colchicine  as needed.  Iron  deficiency anemia Managed with iron  supplementation. Recent iron  levels low. - Advised to continue iron  supplementation twice daily. - Will recheck blood count and iron  levels in two months.    Alexy was seen today for annual exam.  Diagnoses and all orders for this visit:  Encounter for health maintenance examination in adult -     CT CHEST LUNG CA SCREEN LOW DOSE W/O CM; Future -  Hemoglobin A1c -     VITAMIN D  25 Hydroxy (Vit-D Deficiency, Fractures) -     PSA -     Comprehensive metabolic panel with GFR -     Lipid panel -     Urinalysis, Routine w reflex microscopic  Screening for lung cancer -     CT CHEST LUNG CA SCREEN LOW DOSE W/O CM; Future  Former smoker -     CT CHEST LUNG CA SCREEN LOW DOSE W/O CM; Future  Hyperlipidemia, unspecified hyperlipidemia type -     Lipid panel  Vitamin D  deficiency -     VITAMIN D  25 Hydroxy (Vit-D Deficiency, Fractures)  Type 2 diabetes mellitus with hyperglycemia, without long-term current use of insulin  (HCC) -     Hemoglobin A1c -     Urinalysis, Routine w reflex  microscopic  Screening for prostate cancer -     PSA  Screening for colon cancer   Follow up pending labs  [facility]      [1] No Known Allergies [2]  Current Outpatient Medications on File Prior to Visit  Medication Sig Dispense Refill   colchicine  0.6 MG tablet Take 1 tablet (0.6 mg total) by mouth daily. 90 tablet 1   Iron , Ferrous Sulfate , 325 (65 Fe) MG TABS Take 325 mg by mouth daily. 90 tablet 1   losartan  (COZAAR ) 25 MG tablet Take 1 tablet (25 mg total) by mouth daily. 90 tablet 1   metFORMIN  (GLUCOPHAGE ) 500 MG tablet Take 1 tablet (500 mg total) by mouth 2 (two) times daily with a meal. 60 tablet 3   rosuvastatin  (CRESTOR ) 20 MG tablet Take 20 mg by mouth daily.     No current facility-administered medications on file prior to visit.  [3]  Current Outpatient Medications:    colchicine  0.6 MG tablet, Take 1 tablet (0.6 mg total) by mouth daily., Disp: 90 tablet, Rfl: 1   Iron , Ferrous Sulfate , 325 (65 Fe) MG TABS, Take 325 mg by mouth daily., Disp: 90 tablet, Rfl: 1   losartan  (COZAAR ) 25 MG tablet, Take 1 tablet (25 mg total) by mouth daily., Disp: 90 tablet, Rfl: 1   metFORMIN  (GLUCOPHAGE ) 500 MG tablet, Take 1 tablet (500 mg total) by mouth 2 (two) times daily with a meal., Disp: 60 tablet, Rfl: 3   rosuvastatin  (CRESTOR ) 20 MG tablet, Take 20 mg by mouth daily., Disp: , Rfl:   "

## 2024-05-12 ENCOUNTER — Other Ambulatory Visit: Payer: Self-pay | Admitting: Medical

## 2024-05-12 ENCOUNTER — Ambulatory Visit: Payer: Self-pay | Admitting: Medical

## 2024-05-12 DIAGNOSIS — E1165 Type 2 diabetes mellitus with hyperglycemia: Secondary | ICD-10-CM

## 2024-05-12 LAB — COMPREHENSIVE METABOLIC PANEL WITH GFR
ALT: 14 IU/L (ref 0–44)
AST: 21 IU/L (ref 0–40)
Albumin: 4.9 g/dL (ref 3.9–4.9)
Alkaline Phosphatase: 113 IU/L (ref 47–123)
BUN/Creatinine Ratio: 11 (ref 10–24)
BUN: 8 mg/dL (ref 8–27)
Bilirubin Total: 0.8 mg/dL (ref 0.0–1.2)
CO2: 23 mmol/L (ref 20–29)
Calcium: 11.1 mg/dL — ABNORMAL HIGH (ref 8.6–10.2)
Chloride: 103 mmol/L (ref 96–106)
Creatinine, Ser: 0.76 mg/dL (ref 0.76–1.27)
Globulin, Total: 2.4 g/dL (ref 1.5–4.5)
Glucose: 117 mg/dL — ABNORMAL HIGH (ref 70–99)
Potassium: 4 mmol/L (ref 3.5–5.2)
Sodium: 139 mmol/L (ref 134–144)
Total Protein: 7.3 g/dL (ref 6.0–8.5)
eGFR: 99 mL/min/1.73

## 2024-05-12 LAB — MICROSCOPIC EXAMINATION
Bacteria, UA: NONE SEEN
Casts: NONE SEEN /LPF
RBC, Urine: NONE SEEN /HPF (ref 0–2)

## 2024-05-12 LAB — HEMOGLOBIN A1C
Est. average glucose Bld gHb Est-mCnc: 146 mg/dL
Hgb A1c MFr Bld: 6.7 % — ABNORMAL HIGH (ref 4.8–5.6)

## 2024-05-12 LAB — URINALYSIS, ROUTINE W REFLEX MICROSCOPIC
Bilirubin, UA: NEGATIVE
Glucose, UA: NEGATIVE
Ketones, UA: NEGATIVE
Nitrite, UA: NEGATIVE
RBC, UA: NEGATIVE
Specific Gravity, UA: 1.023 (ref 1.005–1.030)
Urobilinogen, Ur: 0.2 mg/dL (ref 0.2–1.0)
pH, UA: 5.5 (ref 5.0–7.5)

## 2024-05-12 LAB — LIPID PANEL
Chol/HDL Ratio: 3.6 ratio (ref 0.0–5.0)
Cholesterol, Total: 176 mg/dL (ref 100–199)
HDL: 49 mg/dL
LDL Chol Calc (NIH): 103 mg/dL — ABNORMAL HIGH (ref 0–99)
Triglycerides: 135 mg/dL (ref 0–149)
VLDL Cholesterol Cal: 24 mg/dL (ref 5–40)

## 2024-05-12 LAB — PSA: Prostate Specific Ag, Serum: 2.1 ng/mL (ref 0.0–4.0)

## 2024-05-12 LAB — VITAMIN D 25 HYDROXY (VIT D DEFICIENCY, FRACTURES): Vit D, 25-Hydroxy: 13.2 ng/mL — ABNORMAL LOW (ref 30.0–100.0)

## 2024-05-12 MED ORDER — CHLORTHALIDONE 25 MG PO TABS: 25.0000 mg | ORAL_TABLET | Freq: Every day | ORAL | 0 refills | Status: AC

## 2024-05-12 MED ORDER — METFORMIN HCL 500 MG PO TABS: 500.0000 mg | ORAL_TABLET | Freq: Two times a day (BID) | ORAL | 3 refills | Status: AC

## 2024-05-12 NOTE — Progress Notes (Signed)
 Results through my chart  Schedule 1 month follow up

## 2024-05-17 ENCOUNTER — Ambulatory Visit

## 2024-05-17 DIAGNOSIS — R1310 Dysphagia, unspecified: Secondary | ICD-10-CM

## 2024-05-17 NOTE — Therapy (Signed)
 "  OUTPATIENT SPEECH LANGUAGE PATHOLOGY SWALLOW TREATMENT   Patient Name: Oscar Myers MRN: [id number] DOB:Mar 09, 1956, 69 y.o., male Today's Date: 05/17/2024  PCP: [name], PA-C REFERRING PROVIDER: [name], PA-C  END OF SESSION:  End of Session - 05/17/24 1502     Visit Number 4    Number of Visits 7    Date for Recertification  05/30/24    Authorization Type [employer] Medicare    SLP Start Time 1447    SLP Stop Time  1502    SLP Time Calculation (min) 15 min    Activity Tolerance Patient tolerated treatment well             Past Medical History:  Diagnosis Date   CTS (carpal tunnel syndrome)    Diabetes mellitus without complication (HCC)    type 2   Dyslipidemia    Hearing loss    currently does not wear hearing aids   Hypertension    Peripheral vascular disease    Smoker    Past Surgical History:  Procedure Laterality Date   ANTERIOR CERVICAL DECOMPRESSION/DISCECTOMY FUSION 4 LEVELS N/A 01/17/2024   Procedure: ANTERIOR CERVICAL DECOMPRESSION/DISCECTOMY FUSION CERVICAL THREE-FOUR CERVICAL FOUR-FIVE CERVICAL FIVE-SIX CERVICAL SIX-SEVEN;  Surgeon: Joshua Alm Hamilton, MD;  Location: [facility] OR;  Service: Neurosurgery;  Laterality: N/A;   BACK SURGERY     CARPAL TUNNEL RELEASE Left 04/30/2016   Procedure: LEFT CARPAL TUNNEL RELEASE ENDOSCOPIC;  Surgeon: Evalene JONETTA Chancy, MD;  Location: [facility];  Service: Orthopedics;  Laterality: Left;   CARPAL TUNNEL RELEASE Left 01/10/2020   Procedure: LEFT CARPAL TUNNEL RELEASE;  Surgeon: Jerri Kay HERO, MD;  Location: [facility];  Service: Orthopedics;  Laterality: Left;   CARPAL TUNNEL RELEASE Right    COLONOSCOPY  05/04/2010   COLONOSCOPY  08/31/2018   repeat 5-10 years.  Dr. Gwenith Pence, [facility]   SHOULDER ARTHROSCOPY WITH ROTATOR CUFF REPAIR AND SUBACROMIAL DECOMPRESSION Left 01/10/2020   Procedure: LEFT SHOULDER ARTHROSCOPY WITH EXTENSIVE DEBRIDEMENT, SUBACROMIAL  DECOMPRESSION, DISTAL CLAVICLE EXCISION, MANIPULATION UNDER ANESTHESIA;  Surgeon: Jerri Kay HERO, MD;  Location: [facility];  Service: Orthopedics;  Laterality: Left;   Patient Active Problem List   Diagnosis Date Noted   S/P cervical spinal fusion 01/17/2024   Encounter for health maintenance examination in adult 05/03/2023   Screening for prostate cancer 05/03/2023   Hyperlipidemia 05/03/2023   Screening for lung cancer 05/03/2023   Vaccine counseling 05/03/2023   Vitamin D  deficiency 05/03/2023   Type 2 diabetes mellitus with hyperglycemia, without long-term current use of insulin  (HCC) 05/03/2023   Advanced directives, counseling/discussion 05/03/2023   Left carpal tunnel syndrome 12/27/2019   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 Pt accompanied by: self  PERTINENT HISTORY: Pt is a 69 y.o. male who presents for a dysphagia evaluation s/p ACDF on 01/17/2024. Pt states that when he swallows any type of solid texture or pill, he experiences it getting stuck in his  throat, requiring him to sometimes cough up what he just swallowed. Pt endorses voice changes after the procedure; however, these voice changes have resolved. Pt has no recent hx of reflux. Pt notes that liquids go down easier than solids. He states that he can feel  a flap-like sensation when he drinks liquids. Pt sometimes experiences minor pain during swallowing. Pt  has not completed an objective swallow study at this time. PMHx: CTS, DM, hearing loss, HTN, PVD, smoker, back surg L TSA.   PAIN:  Are you having pain? No  FALLS: Has patient fallen in last 6 months?  No  LIVING ENVIRONMENT: Lives with: lives with their family and lives with their spouse Lives in: House/apartment  PLOF:  Level of assistance: Independent with ADLs Employment: Part-time employment  PATIENT GOALS: To improve swallowing.   OBJECTIVE:  Note: Objective measures were completed at Evaluation unless otherwise noted. OBJECTIVE:   DIAGNOSTIC FINDINGS: No formal imaging completed for swallowing. SLP recommended pt to get Modified Barium Swallow Study completed to analyze oropharyngeal swallow function due to c/o dysphagia.  INSTRUMENTAL SWALLOW STUDY FINDINGS () TBD Objective swallow impairments: TBD Objective recommended compensations: TBD  COGNITION: Overall cognitive status: Within functional limits for tasks assessed   SUBJECTIVE DYSPHAGIA REPORTS:  Date of onset: 01/17/24 Reported symptoms: choking with solids, odynophagia, and globus sensation  Current diet: regular and thin liquids  Co-morbid voice changes: Yes These voice changes have resolved.   FACTORS WHICH MAY INCREASE RISK OF ADVERSE EVENT IN PRESENCE OF ASPIRATION:  General health: well appearing  Risk factors: none evident     ORAL MOTOR EXAMINATION: Overall status: WFL Comments: N/A  CLINICAL SWALLOW ASSESSMENT:   Dentition: adequate natural dentition Vocal quality at baseline: normal Patient directly observed with POs: Yes: regular and thin liquids  Feeding: able to feed self Liquids provided by: cup Yale Swallow Protocol: Fail: Immediate coughing  Oral phase signs and symptoms: No oral signs present. Pharyngeal phase signs and symptoms: multiple swallows, immediate cough, complaints of residue, and complaints of globus  PATIENT REPORTED OUTCOME MEASURES (PROM): EAT-10: 28/40 Pt rated a 4  (severe problem) on my swallowing problem interferes with my ability to go out for meals, swallowing solids takes extra effort, and The pleasure of eating is affected by my swallowing.                                                                                                                             TREATMENT DATE:  05/17/24: SLP reviewed pharyngeal strengthening exercises for optimizing pt's swallow safety and efficiency. Pt independently performed pharyngeal strengthening exercises (Effortful swallow, Masako, CTAR) with 100% consistency. Pt has Modified Barium Swallow Study coming up this Monday; SLP educated pt about what to expect during MBSS and where to go for the study. Pt continues to make progress with swallow goals and awaits further results from objective imaging on Monday. Plan is to discuss MBSS results and to determine future course of  tx.   05/10/24: Pt is utilizing swallow and throat clearing/cough strategies at home. Pt notices that he is having less trouble with things getting stuck. SLP reviewed pharyngeal swallow exercises (effortful swallow and Masako) for maximizing pt's swallow strength. Pt demonstrated successful use of pharyngeal strengthening exercises in 100% during PO trials of thin-liquidsof opportunities given rare min A. SLP introduced chin-tuck against resistance (CTAR) as another pharyngeal strengthening exercise for maximizing swallow. Pt performed CTAR with 100% consistency given rare min A for chin positioning. Pt is making progress with swallow goals. Pt notices that his throat clearing has reduced substantially since previous session. Plan is to continue pharyngeal strengthening exercises, as pt waits for MBSS on 05/22/24.   05/01/24: Pt was able to schedule MBSS for 05/22/24. SLP introduced swallow precautions for optimizing pt swallow safety (small bites/sips, eating at slow rate). Pt taught-back swallow precautions independently with 100% consistency.  Pt notes that he clears his throat very often and often has to cough even outside of meals. SLP educated pt in strategies for minimizing throat clearing to promote throat hygiene. SLP guided pt through taking water sips, hard swallows, and using a shhhh followed by deep breath for minimizing throat clearing and excessive coughing. SLP introduced pharyngeal swallow exercises (effortful swallow and Masako) for maximizing pt's swallow strength. Pt demonstrated successful use of pharyngeal strengthening exercises in 100% of opportunities given rare min A. SLP provided pt instruction sheet for implementing swallow precautions, throat clearing strategies, and pharyngeal swallow exercises at home. Plan is to continue pharyngeal strengthening exercises and throat clearing strategies for optimizing pt's QOL.  04/18/24: Evaluation completed. No formal tx completed on this date outside of education.   PATIENT EDUCATION: Education details: Physiology of swallowing, recommendation and reasoning for completing a modified barium swallow study. Effortful swallow as a pharyngeal strengthening exercise. Person educated: Patient Education method: Explanation, Demonstration, and Handouts Education comprehension: verbalized understanding   ASSESSMENT:  CLINICAL IMPRESSION: Patient is a 69 y.o. male who was seen today for dysphagia evaluation. Pt presents with a unspecified dysphagia characterized by pt reports of dysphagia with solid textures and pills and clinical swallow indicators, such as immediate coughing during yale swallow protocol. From PROM, pt noted significant distress with current swallowing skills, especially in areas of pleasure for eating and eating solid foods. It is highly recommended that pt completes an objective swallow evaluation due to potential s/sx of aspiration and pharyngeal dysphagia s/p ACDF. Dysphagia tx is recommended due to significant impact of dysphagia s/sx on pt's QOL and  potential swallow health and safety. Without ST services, pt is potentially at risk for decreased nutrition due to loss of pleasure in eating, as well as potential increased risk of aspiration without aspiration precautions.   OBJECTIVE IMPAIRMENTS: include dysphagia. These impairments are limiting patient from safety when swallowing. Factors affecting potential to achieve goals and functional outcome are lack of objective swallow imaging. Patient will benefit from skilled SLP services to address above impairments and improve overall function. Pt will also highly benefit from getting an objective swallow test  REHAB POTENTIAL: Excellent   GOALS: Goals reviewed with patient? Yes   LONG TERM GOALS: STGs = LTGs Target date: 05/30/24  Pt will complete MBSS. Baseline:  Goal status: ONGOING  2.  Pt will describe and demonstrate aspiration precautions in 90% of opportunities for optimizing swallow safety.  Baseline:  Goal status: ONGOING  3.  Pt will perform pharyngeal strengthening exercises (effortful swallow) as indicated for maximizing swallow safety and efficiency given  rare min A.  Baseline:  Goal status: ONGOING  4.  Pt will score better on EAT-10 displaying reduced s/sx of impacts from dysphagia.  Baseline:  Goal status: ONGOING  5.  TBD Baseline:  Goal status: ONGOING  6.  TBD Baseline:  Goal status: ONGOING  PLAN:  SLP FREQUENCY: 1x/week  SLP DURATION: 6 weeks  PLANNED INTERVENTIONS: Aspiration precaution training, Pharyngeal strengthening exercises, Diet toleration management , SLP instruction and feedback, Compensatory strategies, Patient/family education, and [id number] Treatment of swallowing function    Waddell Music, CF-SLP 05/17/2024, 3:20 PM          "

## 2024-05-22 ENCOUNTER — Ambulatory Visit (HOSPITAL_COMMUNITY)
Admission: RE | Admit: 2024-05-22 | Discharge: 2024-05-22 | Disposition: A | Source: Ambulatory Visit | Attending: Medical | Admitting: Medical

## 2024-05-22 ENCOUNTER — Ambulatory Visit (HOSPITAL_COMMUNITY)
Admission: RE | Admit: 2024-05-22 | Discharge: 2024-05-22 | Disposition: A | Source: Ambulatory Visit | Attending: Medical

## 2024-05-22 DIAGNOSIS — Z09 Encounter for follow-up examination after completed treatment for conditions other than malignant neoplasm: Secondary | ICD-10-CM | POA: Diagnosis not present

## 2024-05-22 DIAGNOSIS — R1310 Dysphagia, unspecified: Secondary | ICD-10-CM | POA: Diagnosis present

## 2024-05-22 DIAGNOSIS — R490 Dysphonia: Secondary | ICD-10-CM | POA: Diagnosis not present

## 2024-05-22 DIAGNOSIS — R059 Cough, unspecified: Secondary | ICD-10-CM

## 2024-05-22 DIAGNOSIS — R1312 Dysphagia, oropharyngeal phase: Secondary | ICD-10-CM | POA: Insufficient documentation

## 2024-05-22 NOTE — Therapy (Signed)
 Modified Barium Swallow Study  Patient Details  Name: Oscar Myers MRN: [id number] Date of Birth: [birth year]  Today's Date: 05/22/2024  Modified Barium Swallow completed.  Full report located under Chart Review in the Imaging Section.  History of Present Illness Oscar Myers is a 69 y.o. male who presented to this OP MBS secondary to dysphagia symptoms that began after 4-level ACDF which was completed 01/17/24. He reported that his dysphagia symptoms consist of cough and need to regurgitate small pieces of food at least three times every meal, sensation of pills getting stuck in throat. His symptoms occur throughout the meal. He did indicated that foods that are most problematic include meats like chicken, steak, beans but that he does not have difficulty swallowing things such as hamburger meats, chips, hard taco shells. He sees an SLP on an OP basis, who recommended MBS to objectively assess his swallow function. He indicated that he does have a h/o reflux but it has been years since he has had any symptoms and doesn't currently take any medications for it. PMH: current tobacco use, HTN, DM, 4-level ACDF.   Clinical Impression SLP briefly discussed results of MBS following the study with Oscar Myers. Although this MBS does appear to help explain  his pharyngeal sensation of PO's given the residuals seen, it does not appear to explain his report of regurgitation of foods. Recommend f/u with OP SLP.   Patient presents with a mild oropharyngeal dysphagia as per this MBS. No penetration or aspiration observed with any of the tested barium consistencies. In addition, presence of ACDF hardware does appear to prevent full epiglottic inversion. Pharyngeal residuals of all consistencies of barium was present in vallecular sinus, pyriform sinus and posterior pharyngeal wall. Swallow initiation occured at level of pyriform sinus with thin liquids. An instance of trace retrograde movement of barium below  PES observed. 13mm barium tablet taken with thin liquid barium transited through pharynx, PES and esophagus with difficulty or delay.  DIGEST Swallow Severity Rating*  Safety: 0  Efficiency:1  Overall Pharyngeal Swallow Severity: 1 1: mild; 2: moderate; 3: severe; 4: profound  *The Dynamic Imaging Grade of Swallowing Toxicity is standardized for the head and neck cancer population, however, demonstrates promising clinical applications across populations to standardize the clinical rating of pharyngeal swallow safety and severity.   Swallow Evaluation Recommendations Recommendations: PO diet PO Diet Recommendation: Regular;Thin liquids (Level 0) Liquid Administration via: Cup;Straw Medication Administration: Whole meds with liquid Supervision: Patient able to self-feed Swallowing strategies  : Small bites/sips;Slow rate Postural changes: Position pt fully upright for meals;Stay upright 30-60 min after meals      Norleen IVAR Blase, MA, CCC-SLP Speech Therapy  05/22/2024,2:20 PM

## 2024-05-23 ENCOUNTER — Ambulatory Visit
Admission: RE | Admit: 2024-05-23 | Discharge: 2024-05-23 | Disposition: A | Source: Ambulatory Visit | Attending: Medical | Admitting: Medical

## 2024-05-23 DIAGNOSIS — Z122 Encounter for screening for malignant neoplasm of respiratory organs: Secondary | ICD-10-CM

## 2024-05-23 DIAGNOSIS — Z87891 Personal history of nicotine dependence: Secondary | ICD-10-CM

## 2024-05-23 DIAGNOSIS — Z0000 Encounter for general adult medical examination without abnormal findings: Secondary | ICD-10-CM

## 2024-05-24 ENCOUNTER — Ambulatory Visit: Payer: Self-pay | Admitting: Medical

## 2024-05-24 ENCOUNTER — Ambulatory Visit

## 2024-05-24 DIAGNOSIS — R1310 Dysphagia, unspecified: Secondary | ICD-10-CM | POA: Diagnosis not present

## 2024-05-24 DIAGNOSIS — R1312 Dysphagia, oropharyngeal phase: Secondary | ICD-10-CM

## 2024-05-24 NOTE — Therapy (Signed)
 "  OUTPATIENT SPEECH LANGUAGE PATHOLOGY SWALLOW TREATMENT/DISCHARGE SUMMARY   Patient Name: Oscar Myers MRN: [id number] DOB:[birth year], 69 y.o., male Today's Date: 05/24/2024  PCP: [name], PA-C REFERRING PROVIDER: [name], PA-C  END OF SESSION:  End of Session - 05/24/24 1504     Visit Number 5    Number of Visits 7    Date for Recertification  05/30/24    Authorization Type [employer]    SLP Start Time 1446    SLP Stop Time  1504    SLP Time Calculation (min) 18 min    Activity Tolerance Patient tolerated treatment well             Past Medical History:  Diagnosis Date   CTS (carpal tunnel syndrome)    Diabetes mellitus without complication (HCC)    type 2   Dyslipidemia    Hearing loss    currently does not wear hearing aids   Hypertension    Peripheral vascular disease    Smoker    Past Surgical History:  Procedure Laterality Date   ANTERIOR CERVICAL DECOMPRESSION/DISCECTOMY FUSION 4 LEVELS N/A 01/17/2024   Procedure: ANTERIOR CERVICAL DECOMPRESSION/DISCECTOMY FUSION CERVICAL THREE-FOUR CERVICAL FOUR-FIVE CERVICAL FIVE-SIX CERVICAL SIX-SEVEN;  Surgeon: Joshua Alm Hamilton, MD;  Location: [facility] OR;  Service: Neurosurgery;  Laterality: N/A;   BACK SURGERY     CARPAL TUNNEL RELEASE Left 04/30/2016   Procedure: LEFT CARPAL TUNNEL RELEASE ENDOSCOPIC;  Surgeon: Evalene JONETTA Chancy, MD;  Location: [facility];  Service: Orthopedics;  Laterality: Left;   CARPAL TUNNEL RELEASE Left 01/10/2020   Procedure: LEFT CARPAL TUNNEL RELEASE;  Surgeon: Jerri Kay HERO, MD;  Location: [facility];  Service: Orthopedics;  Laterality: Left;   CARPAL TUNNEL RELEASE Right    COLONOSCOPY  05/04/2010   COLONOSCOPY  08/31/2018   repeat 5-10 years.  Dr. Gwenith Pence, [facility]   SHOULDER ARTHROSCOPY WITH ROTATOR CUFF REPAIR AND SUBACROMIAL DECOMPRESSION Left 01/10/2020   Procedure: LEFT SHOULDER ARTHROSCOPY WITH EXTENSIVE DEBRIDEMENT,  SUBACROMIAL DECOMPRESSION, DISTAL CLAVICLE EXCISION, MANIPULATION UNDER ANESTHESIA;  Surgeon: Jerri Kay HERO, MD;  Location: [facility];  Service: Orthopedics;  Laterality: Left;   Patient Active Problem List   Diagnosis Date Noted   S/P cervical spinal fusion 01/17/2024   Encounter for health maintenance examination in adult 05/03/2023   Screening for prostate cancer 05/03/2023   Hyperlipidemia 05/03/2023   Screening for lung cancer 05/03/2023   Vaccine counseling 05/03/2023   Vitamin D  deficiency 05/03/2023   Type 2 diabetes mellitus with hyperglycemia, without long-term current use of insulin  (HCC) 05/03/2023   Advanced directives, counseling/discussion 05/03/2023   Left carpal tunnel syndrome 12/27/2019   Adhesive capsulitis of left shoulder 12/27/2019   Tendinopathy of left biceps 12/27/2019   Hyperlipidemia with target LDL less than 100 06/23/2012   Family history of heart disease in male family member before age 23 06/23/2012    ONSET DATE: 03/20/2024 (Referral date)   REFERRING DIAG: R13.10 (ICD-10-CM) - Dysphagia, unspecified type R49.0 (ICD-10-CM) - Hoarseness Z09 (ICD-10-CM) - S/P neck surgery, follow-up exam  THERAPY DIAG:  Dysphagia, oropharyngeal phase  Rationale for Evaluation and Treatment: Rehabilitation  SUBJECTIVE:   SUBJECTIVE STATEMENT: I can't go out to eat. Pt accompanied by: self  PERTINENT HISTORY: Pt is a 69 y.o. male who presents for a dysphagia evaluation s/p ACDF on 01/17/2024. Pt states that when he swallows any type of solid texture or pill, he experiences it getting stuck in  his throat, requiring him to sometimes cough up what he just swallowed. Pt endorses voice changes after the procedure; however, these voice changes have resolved. Pt has no recent hx of reflux. Pt notes that liquids go down easier than solids. He states that he can feel  a flap-like sensation when he drinks liquids. Pt sometimes experiences minor pain during  swallowing. Pt has not completed an objective swallow study at this time. PMHx: CTS, DM, hearing loss, HTN, PVD, smoker, back surg L TSA.   PAIN:  Are you having pain? No  FALLS: Has patient fallen in last 6 months?  No  LIVING ENVIRONMENT: Lives with: lives with their family and lives with their spouse Lives in: House/apartment  PLOF:  Level of assistance: Independent with ADLs Employment: Part-time employment  PATIENT GOALS: To improve swallowing.   OBJECTIVE:  Note: Objective measures were completed at Evaluation unless otherwise noted. OBJECTIVE:   DIAGNOSTIC FINDINGS: No formal imaging completed for swallowing. SLP recommended pt to get Modified Barium Swallow Study completed to analyze oropharyngeal swallow function due to c/o dysphagia.  INSTRUMENTAL SWALLOW STUDY FINDINGS () TBD Objective swallow impairments: TBD Objective recommended compensations: TBD  COGNITION: Overall cognitive status: Within functional limits for tasks assessed   SUBJECTIVE DYSPHAGIA REPORTS:  Date of onset: 01/17/24 Reported symptoms: choking with solids, odynophagia, and globus sensation  Current diet: regular and thin liquids  Co-morbid voice changes: Yes These voice changes have resolved.   FACTORS WHICH MAY INCREASE RISK OF ADVERSE EVENT IN PRESENCE OF ASPIRATION:  General health: well appearing  Risk factors: none evident     ORAL MOTOR EXAMINATION: Overall status: WFL Comments: N/A  CLINICAL SWALLOW ASSESSMENT:   Dentition: adequate natural dentition Vocal quality at baseline: normal Patient directly observed with POs: Yes: regular and thin liquids  Feeding: able to feed self Liquids provided by: cup Yale Swallow Protocol: Fail: Immediate coughing  Oral phase signs and symptoms: No oral signs present. Pharyngeal phase signs and symptoms: multiple swallows, immediate cough, complaints of residue, and complaints of globus  PATIENT REPORTED OUTCOME MEASURES (PROM): EAT-10:  28/40 Pt rated a 4 (severe problem) on my swallowing problem interferes with my ability to go out for meals, swallowing solids takes extra effort, and The pleasure of eating is affected by my swallowing.                                                                                                                             TREATMENT DATE:  05/24/24: SLP educated pt about MBSS results from 05/22/24. The study found partial epiglottic inversion and pharyngeal residue. SLP recommended compensatory swallow strategies (multiple swallows and liquid wash) for optimizing swallow efficiency. Pt is agreeable to trying compensatory swallow strategies. Pt is ready for d/c at this time.   05/17/24: SLP reviewed pharyngeal strengthening exercises for optimizing pt's swallow safety and efficiency. Pt independently performed pharyngeal strengthening exercises (Effortful swallow, Masako, CTAR) with 100% consistency. Pt has Modified Barium  Swallow Study coming up this Monday; SLP educated pt about what to expect during MBSS and where to go for the study. Pt continues to make progress with swallow goals and awaits further results from objective imaging on Monday. Plan is to discuss MBSS results and to determine future course of tx.   05/10/24: Pt is utilizing swallow and throat clearing/cough strategies at home. Pt notices that he is having less trouble with things getting stuck. SLP reviewed pharyngeal swallow exercises (effortful swallow and Masako) for maximizing pt's swallow strength. Pt demonstrated successful use of pharyngeal strengthening exercises in 100% during PO trials of thin-liquidsof opportunities given rare min A. SLP introduced chin-tuck against resistance (CTAR) as another pharyngeal strengthening exercise for maximizing swallow. Pt performed CTAR with 100% consistency given rare min A for chin positioning. Pt is making progress with swallow goals. Pt notices that his throat clearing has reduced  substantially since previous session. Plan is to continue pharyngeal strengthening exercises, as pt waits for MBSS on 05/22/24.   05/01/24: Pt was able to schedule MBSS for 05/22/24. SLP introduced swallow precautions for optimizing pt swallow safety (small bites/sips, eating at slow rate). Pt taught-back swallow precautions independently with 100% consistency. Pt notes that he clears his throat very often and often has to cough even outside of meals. SLP educated pt in strategies for minimizing throat clearing to promote throat hygiene. SLP guided pt through taking water sips, hard swallows, and using a shhhh followed by deep breath for minimizing throat clearing and excessive coughing. SLP introduced pharyngeal swallow exercises (effortful swallow and Masako) for maximizing pt's swallow strength. Pt demonstrated successful use of pharyngeal strengthening exercises in 100% of opportunities given rare min A. SLP provided pt instruction sheet for implementing swallow precautions, throat clearing strategies, and pharyngeal swallow exercises at home. Plan is to continue pharyngeal strengthening exercises and throat clearing strategies for optimizing pt's QOL.  04/18/24: Evaluation completed. No formal tx completed on this date outside of education.   PATIENT EDUCATION: Education details: Physiology of swallowing, recommendation and reasoning for completing a modified barium swallow study. Effortful swallow as a pharyngeal strengthening exercise. Person educated: Patient Education method: Explanation, Demonstration, and Handouts Education comprehension: verbalized understanding   ASSESSMENT:  CLINICAL IMPRESSION: Patient is a 69 y.o. male who was seen today for dysphagia evaluation. Pt presents with a unspecified dysphagia characterized by pt reports of dysphagia with solid textures and pills and clinical swallow indicators, such as immediate coughing during yale swallow protocol. From PROM, pt noted  significant distress with current swallowing skills, especially in areas of pleasure for eating and eating solid foods. It is highly recommended that pt completes an objective swallow evaluation due to potential s/sx of aspiration and pharyngeal dysphagia s/p ACDF. Dysphagia tx is recommended due to significant impact of dysphagia s/sx on pt's QOL and potential swallow health and safety. Without ST services, pt is potentially at risk for decreased nutrition due to loss of pleasure in eating, as well as potential increased risk of aspiration without aspiration precautions.   OBJECTIVE IMPAIRMENTS: include dysphagia. These impairments are limiting patient from safety when swallowing. Factors affecting potential to achieve goals and functional outcome are lack of objective swallow imaging. Patient will benefit from skilled SLP services to address above impairments and improve overall function. Pt will also highly benefit from getting an objective swallow test  REHAB POTENTIAL: Excellent   GOALS: Goals reviewed with patient? Yes   LONG TERM GOALS: STGs = LTGs Target date: 05/30/24  Pt will  complete MBSS. Baseline:  Goal status: MET  2.  Pt will describe and demonstrate aspiration precautions in 90% of opportunities for optimizing swallow safety.  Baseline:  Goal status: MET  3.  Pt will perform pharyngeal strengthening exercises (effortful swallow) as indicated for maximizing swallow safety and efficiency given rare min A.  Baseline:  Goal status: MET  4.  Pt will score better on EAT-10 displaying reduced s/sx of impacts from dysphagia.  Baseline:  Goal status: DEFERRED  5.  TBD Baseline:  Goal status: ONGOING  6.  TBD Baseline:  Goal status: ONGOING  PLAN:  SLP FREQUENCY: 1x/week  SLP DURATION: 6 weeks  PLANNED INTERVENTIONS: Aspiration precaution training, Pharyngeal strengthening exercises, Diet toleration management , SLP instruction and feedback, Compensatory  strategies, Patient/family education, and [id number] Treatment of swallowing function  SPEECH THERAPY DISCHARGE SUMMARY   Visits from Start of Care: 5   Current functional level related to goals / functional outcomes: See goals above   Remaining deficits: Mild oropharyngeal dysphagia     Education / Equipment: Continued use of compensatory swallow strategies (multiple swallows, small bites, liquid wash) to optimize swallow safety and efficiency.   Patient agrees to discharge. Patient goals were partially met. Patient is being discharged due to no further sessions necessary s/p MBSS results displaying mild deficits potentially anatomical > physiological.     Waddell Music, M.A., CF-SLP 05/24/2024, 3:05 PM          "

## 2024-05-24 NOTE — Progress Notes (Signed)
 Results thru my chart

## 2024-05-30 NOTE — Progress Notes (Signed)
 Results to MyChart

## 2024-05-31 ENCOUNTER — Ambulatory Visit

## 2024-06-06 ENCOUNTER — Ambulatory Visit

## 2024-06-07 ENCOUNTER — Ambulatory Visit: Payer: Self-pay | Admitting: Cardiology

## 2024-06-07 ENCOUNTER — Ambulatory Visit (HOSPITAL_COMMUNITY): Admission: RE | Admit: 2024-06-07 | Discharge: 2024-06-07 | Attending: Cardiology

## 2024-06-07 DIAGNOSIS — I10 Essential (primary) hypertension: Secondary | ICD-10-CM | POA: Diagnosis not present

## 2024-06-07 DIAGNOSIS — I2510 Atherosclerotic heart disease of native coronary artery without angina pectoris: Secondary | ICD-10-CM | POA: Diagnosis not present

## 2024-06-07 LAB — ECHOCARDIOGRAM COMPLETE
Area-P 1/2: 6.17 cm2
S' Lateral: 3.16 cm

## 2024-06-08 NOTE — Telephone Encounter (Signed)
 Patient is requesting a call back with results. Scheduled patient an appt with Dr Elmira for 08/23/24

## 2024-06-13 ENCOUNTER — Ambulatory Visit: Admitting: Medical

## 2024-06-28 ENCOUNTER — Ambulatory Visit: Admitting: Cardiology

## 2024-08-23 ENCOUNTER — Ambulatory Visit: Admitting: Cardiology

## 2025-04-24 ENCOUNTER — Ambulatory Visit: Payer: Self-pay
